# Patient Record
Sex: Female | Born: 1945 | Race: Black or African American | Hispanic: No | Marital: Married | Smoking: Never smoker
Health system: Southern US, Community
[De-identification: ages and names within clinical notes are randomized; demographics above are authoritative.]

## PROBLEM LIST (undated history)

## (undated) DIAGNOSIS — M8580 Other specified disorders of bone density and structure, unspecified site: Secondary | ICD-10-CM

## (undated) DIAGNOSIS — D692 Other nonthrombocytopenic purpura: Secondary | ICD-10-CM

## (undated) DIAGNOSIS — K649 Unspecified hemorrhoids: Secondary | ICD-10-CM

## (undated) DIAGNOSIS — I714 Abdominal aortic aneurysm, without rupture, unspecified: Secondary | ICD-10-CM

## (undated) DIAGNOSIS — H409 Unspecified glaucoma: Secondary | ICD-10-CM

## (undated) DIAGNOSIS — D649 Anemia, unspecified: Secondary | ICD-10-CM

## (undated) DIAGNOSIS — E559 Vitamin D deficiency, unspecified: Secondary | ICD-10-CM

## (undated) DIAGNOSIS — E7800 Pure hypercholesterolemia, unspecified: Secondary | ICD-10-CM

## (undated) DIAGNOSIS — K219 Gastro-esophageal reflux disease without esophagitis: Secondary | ICD-10-CM

## (undated) DIAGNOSIS — K635 Polyp of colon: Secondary | ICD-10-CM

## (undated) DIAGNOSIS — M419 Scoliosis, unspecified: Secondary | ICD-10-CM

## (undated) DIAGNOSIS — K449 Diaphragmatic hernia without obstruction or gangrene: Secondary | ICD-10-CM

## (undated) DIAGNOSIS — I700 Atherosclerosis of aorta: Secondary | ICD-10-CM

## (undated) DIAGNOSIS — M1990 Unspecified osteoarthritis, unspecified site: Secondary | ICD-10-CM

## (undated) DIAGNOSIS — I10 Essential (primary) hypertension: Secondary | ICD-10-CM

## (undated) DIAGNOSIS — E785 Hyperlipidemia, unspecified: Secondary | ICD-10-CM

## (undated) HISTORY — DX: Abdominal aortic aneurysm, without rupture: I71.4

## (undated) HISTORY — DX: Other specified disorders of bone density and structure, unspecified site: M85.80

## (undated) HISTORY — PX: NO PAST SURGERIES: SHX2092

## (undated) HISTORY — DX: Unspecified hemorrhoids: K64.9

## (undated) HISTORY — DX: Diaphragmatic hernia without obstruction or gangrene: K44.9

## (undated) HISTORY — DX: Abdominal aortic aneurysm, without rupture, unspecified: I71.40

## (undated) HISTORY — DX: Essential (primary) hypertension: I10

## (undated) HISTORY — DX: Atherosclerosis of aorta: I70.0

## (undated) HISTORY — DX: Pure hypercholesterolemia, unspecified: E78.00

## (undated) HISTORY — DX: Polyp of colon: K63.5

## (undated) HISTORY — DX: Vitamin D deficiency, unspecified: E55.9

## (undated) HISTORY — DX: Unspecified osteoarthritis, unspecified site: M19.90

## (undated) HISTORY — DX: Other nonthrombocytopenic purpura: D69.2

## (undated) HISTORY — DX: Unspecified glaucoma: H40.9

## (undated) HISTORY — DX: Hyperlipidemia, unspecified: E78.5

---

## 2015-10-07 ENCOUNTER — Other Ambulatory Visit: Payer: Self-pay | Admitting: Internal Medicine

## 2015-10-07 DIAGNOSIS — Z1231 Encounter for screening mammogram for malignant neoplasm of breast: Secondary | ICD-10-CM

## 2015-10-08 ENCOUNTER — Encounter: Payer: Self-pay | Admitting: Internal Medicine

## 2016-03-29 ENCOUNTER — Ambulatory Visit: Payer: Medicare Other | Attending: Registered Nurse | Admitting: Physical Therapy

## 2016-03-29 DIAGNOSIS — M5441 Lumbago with sciatica, right side: Secondary | ICD-10-CM | POA: Diagnosis not present

## 2016-03-29 DIAGNOSIS — M6281 Muscle weakness (generalized): Secondary | ICD-10-CM | POA: Insufficient documentation

## 2016-03-29 DIAGNOSIS — R293 Abnormal posture: Secondary | ICD-10-CM | POA: Insufficient documentation

## 2016-03-29 NOTE — Therapy (Signed)
[place], [place], [address] Phone: [phone]   Fax:  [phone]  Physical Therapy Evaluation  Patient Details  Name: Meghan Stanley MRN: [id number] Date of Birth: 08/06/1946 Referring Provider: Domenick Gong MD  Encounter Date: 03/29/2016      PT End of Session - 03/29/16 1741    Visit Number 1   Number of Visits 13   Date for PT Re-Evaluation 05/10/16   Authorization Type Medicare: Kx mod by 15th visit, progress note by 10th visit   PT Start Time 1500   PT Stop Time 1548   PT Time Calculation (min) 48 min   Activity Tolerance Patient tolerated treatment well   Behavior During Therapy [facility] for tasks assessed/performed      No past medical history on file.  No past surgical history on file.  There were no vitals filed for this visit.       Subjective Assessment - 03/29/16 1515    Subjective pt is 70 y.o with low back pain with referral down the RLE that started quickly that started a couple years ago. The pain has fluctuated in the back but gradually worsened within the last 6 months. weakness in the RLE with waling/ standing and currenlty used a RW that she just started using it. Referred pain down the RLE to the knee.    Pertinent History hx of scoliosis   Limitations Standing;Walking;House hold activities;Sitting   How long can you sit comfortably? unlimited   How long can you stand comfortably? 20-30 min    How long can you walk comfortably? With RW 15-20 min   Diagnostic tests Bone density at primary MD   Patient Stated Goals to decrease pain, to see if exercises can relieve some of the pain.    Currently in Pain? Yes   Pain Score 7    Pain Location Back   Pain Orientation Lower;Right   Pain Descriptors / Indicators Constant;Sore;Aching   Pain Type Chronic pain   Pain Radiating Towards RLE to the knee   Pain Onset More than a month ago   Pain Frequency Constant   Aggravating  Factors  prolonged walking/ standing   Pain Relieving Factors laying down   Effect of Pain on Daily Activities  limited endurance/ mobility            [facility] PT Assessment - 03/29/16 1524      Assessment   Medical Diagnosis Low back pain with RLE radiculopathy   Referring Provider Domenick Gong MD   Onset Date/Surgical Date --  chronically 2-3 years, recent exacerbation 6 months   Hand Dominance Right   Next MD Visit Make one PRN   Prior Therapy no     Precautions   Precautions None     Restrictions   Weight Bearing Restrictions No     Balance Screen   Has the patient fallen in the past 6 months No   Has the patient had a decrease in activity level because of a fear of falling?  No   Is the patient reluctant to leave their home because of a fear of falling?  No     Home Social worker Private residence   Living Arrangements Spouse/significant other;Children   Available Help at Discharge Available PRN/intermittently   Type of [place] to enter   Entrance Stairs-Number of Steps 1   Entrance Stairs-Rails Right   Home Layout One level  [place] - 2 wheels;Cane - single point     Prior Function   Level of Independence Independent;Independent with basic ADLs   Vocation Retired   Leisure yard work, having company over,      Charity fundraiser Status Within [employer] for tasks assessed     Observation/Other Assessments   Focus on Therapeutic Outcomes (FOTO)  55% limited  predicted 43% limited     Posture/Postural Control   Posture/Postural Control Postural limitations   Postural Limitations Rounded Shoulders;Forward head  lumbar covexity to the R     ROM / Strength   AROM / PROM / Strength AROM;Strength     AROM   AROM Assessment Site Lumbar   Lumbar Flexion 90   Lumbar Extension 10   Lumbar - Right Side Bend 8   Lumbar - Left Side Bend 10     Strength   Strength Assessment Site  Hip;Knee   Right/Left Hip Right;Left   Right Hip Flexion 3+/5   Right Hip Extension 3+/5   Right Hip ABduction 3/5   Right Hip ADduction 4/5   Left Hip Flexion 3+/5   Left Hip Extension 3+/5   Left Hip ABduction 3/5   Left Hip ADduction 4/5   Right/Left Knee Right;Left   Right Knee Flexion 4+/5   Right Knee Extension 4+/5   Left Knee Flexion 4+/5   Left Knee Extension 4+/5     Palpation   Spinal mobility  hypomobiltiy of L1- T11 with PAIVM   Palpation comment tightness along the bil lumbar paraspinals with referral to the upper glute upon palpation     Special Tests    Special Tests Lumbar   Lumbar Tests Slump Test;Straight Leg Raise;other     Slump test   Findings Negative   Side --  bil     Straight Leg Raise   Findings Negative     Ambulation/Gait   Ambulation/Gait Yes   Assistive device Rolling walker  to help push her into proper posture   Gait Pattern Within Functional Limits                           PT Education - 03/29/16 1740    Education provided Yes   Education Details evaluation findings, POC, goals, HEP with form/ treatment rationale   Person(s) Educated Patient   Methods Demonstration;Explanation;Verbal cues   Comprehension Verbalized understanding;Verbal cues required;Returned demonstration          PT Short Term Goals - 03/29/16 1749      PT SHORT TERM GOAL #1   Title pt will be I with inital HEP given ( 04/19/2016)   Time 3   Period Weeks   Status New     PT SHORT TERM GOAL #2   Title pt will be able to verbalize/ demo proper posture and lifting/ carrying mechanics to prevent / reduce low back pain (04/19/2016)   Time 3   Period Weeks   Status New           PT Long Term Goals - 03/29/16 1750      PT LONG TERM GOAL #1   Title pt will be I with all HEP given as throughout treatment (05/10/2016)   Time 6   Period Weeks   Status New     PT LONG TERM GOAL #2   Title She will improve trunk extension / R side  bending by >/= 8 degrees with </=  3/10 pain to assist with proper standing posture, and ADLs (05/10/2016)   Time 6   Period Weeks   Status New     PT LONG TERM GOAL #3   Title She will improve bil overall hip strength to >/= 4/5 with </= 3/10 pain for endurnace and promote safety with prolong walking/ standing (05/10/2016)   Time 6   Period Weeks   Status New     PT LONG TERM GOAL #4   Title pt will be able to walk/ standing for >/= 30 min demonstrate good spinal posture without AD with </=3/10 pain to assist with functional endurance required for ADLs (05/10/2016)   Time 6   Period Weeks   Status New     PT LONG TERM GOAL #5   Title FOTO score increase to </= 43% limited to demonstrate improvement in function at discharge (05/10/2016)   Time 6   Period Weeks   Status New               Plan - 04/03/16 1742    Clinical Impression Statement Mrs. Wrice presents to [facility] as a low complexity evaluation with CC of chronic low back pain with RLE referral. 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 She demonstates limited extension, and R side bending.  hypomobiltiy of L1- T11 with PAIVM, with tightness in bil Lumbar paraspinals. weakness of bil LE.pt would benefit from physical therapy to decrease low back pain, improve LE strength, increase functional mobility/ endurance and maximizing her function by addressing the defecits listed.    Rehab Potential Good   PT Frequency 2x / week   PT Duration 6 weeks   PT Treatment/Interventions ADLs/Self Care Home Management;Cryotherapy;Electrical Stimulation;Iontophoresis 4mg /ml Dexamethasone;Passive range of motion;Dry needling;Taping;Manual techniques;Moist Heat;Patient/family education;Therapeutic exercise;Therapeutic activities;Ultrasound   PT Next Visit Plan assess/ review HEP, core / hip strengthening, Prone on elbows, modalities PRN   PT Home Exercise Plan prone on elbows, lower trunk rotation, pelvic tilts, bridge, clams    Consulted and Agree with Plan of Care Patient      Patient will benefit from skilled therapeutic intervention in order to improve the following deficits and impairments:  Pain, Improper body mechanics, Postural dysfunction, Hypomobility, Impaired flexibility, Decreased strength, Decreased mobility, Decreased activity tolerance, Decreased endurance, Decreased balance, Increased fascial restricitons, Decreased range of motion  Visit Diagnosis: Right-sided low back pain with right-sided sciatica  Abnormal posture  Muscle weakness (generalized)      G-Codes - Apr 03, 2016 1756    Functional Assessment Tool Used FOTO/ clinical judgement    Functional Limitation Changing and maintaining body position   Changing and Maintaining Body Position Current Status [id number]) At least 40 percent but less than 60 percent impaired, limited or restricted   Changing and Maintaining Body Position Goal Status [id number]) At least 20 percent but less than 40 percent impaired, limited or restricted       Problem List There are no active problems to display for this patient.  Starr Lake PT, DPT, LAT, ATC  April 03, 2016  5:58 PM      [facility] [address] [place], [place], [address] Phone: [phone]   Fax:  [phone]  Name: JEANINNE BARBANO MRN: [id number] Date of Birth: [birth year]

## 2016-04-07 ENCOUNTER — Encounter: Payer: Self-pay | Admitting: Physical Therapy

## 2016-04-07 ENCOUNTER — Ambulatory Visit: Payer: Medicare Other | Attending: Registered Nurse | Admitting: Physical Therapy

## 2016-04-07 DIAGNOSIS — R293 Abnormal posture: Secondary | ICD-10-CM | POA: Diagnosis present

## 2016-04-07 DIAGNOSIS — M5441 Lumbago with sciatica, right side: Secondary | ICD-10-CM | POA: Diagnosis not present

## 2016-04-07 DIAGNOSIS — M6281 Muscle weakness (generalized): Secondary | ICD-10-CM | POA: Insufficient documentation

## 2016-04-07 NOTE — Therapy (Signed)
[place], [place], [address] Phone: [phone]   Fax:  [phone]  Physical Therapy Treatment  Patient Details  Name: Meghan Stanley MRN: [id number] Date of Birth: 14-Mar-1946 Referring Provider: Domenick Gong MD  Encounter Date: 04/07/2016      PT End of Session - 04/07/16 1507    Visit Number 2   Number of Visits 13   Date for PT Re-Evaluation 05/10/16   Authorization Type Medicare: Kx mod by 15th visit, progress note by 10th visit   PT Start Time 1420   PT Stop Time 1500   PT Time Calculation (min) 40 min   Activity Tolerance Patient tolerated treatment well   Behavior During Therapy [facility] for tasks assessed/performed      No past medical history on file.  No past surgical history on file.  There were no vitals filed for this visit.      Subjective Assessment - 04/07/16 1426    Subjective "it looks like it is trying to help"   Currently in Pain? Yes   Pain Score 7   only with getting up and walking   Pain Location Back   Pain Orientation Right;Lower   Pain Descriptors / Indicators Aching;Sore   Pain Type Chronic pain   Pain Onset More than a month ago   Pain Frequency Constant   Aggravating Factors  getting up and walking   Pain Relieving Factors laying down,                          [facility] Adult PT Treatment/Exercise - 04/07/16 0001      Lumbar Exercises: Stretches   Prone on Elbows Stretch 5 reps;30 seconds   Prone on Elbows Stretch Limitations with upper body and lower body shifted to the R to work on Personal assistant Therapy   Manual Therapy Soft tissue mobilization;Other (comment)   Soft tissue mobilization DTM along R lumbar paraspinals and along R illac crest   Other Manual Therapy leg length mob with R leg pressing and Long axis distraction on L 5 x 10 sec hold                PT Education - 04/07/16 1510    Education provided Yes   Education Details Reviewed HEP, provided heel lift and educated she it may take a little while to get acclimated to it. to sit up straight to promote promote posture that will help with breathing and posture.    Person(s) Educated Patient   Methods Explanation;Verbal cues   Comprehension Verbalized understanding          PT Short Term Goals - 03/29/16 1749      PT SHORT TERM GOAL #1   Title pt will be I with inital HEP given ( 04/19/2016)   Time 3   Period Weeks   Status New     PT SHORT TERM GOAL #2   Title pt will be able to verbalize/ demo proper posture and lifting/ carrying mechanics to prevent / reduce low back pain (04/19/2016)   Time 3   Period Weeks   Status New           PT Long Term Goals - 03/29/16 1750      PT LONG TERM GOAL #1   Title pt will be I with all HEP given as throughout treatment (05/10/2016)   Time 6   Period Weeks  Status New     PT LONG TERM GOAL #2   Title [name], and ADLs (05/10/2016)   Time 6   Period Weeks   Status New     PT LONG TERM GOAL #3   Title She will improve bil overall hip strength to >/= 4/5 with </= 3/10 pain for endurnace and promote safety with prolong walking/ standing (05/10/2016)   Time 6   Period Weeks   Status New     PT LONG TERM GOAL #4   Title pt will be able to walk/ standing for >/= 30 min demonstrate good spinal posture without AD with </=3/10 pain to assist with functional endurance required for ADLs (05/10/2016)   Time 6   Period Weeks   Status New     PT LONG TERM GOAL #5   Title FOTO score increase to </= 43% limited to demonstrate improvement in function at discharge (05/10/2016)   Time 6   Period Weeks   Status New               Plan - 04/07/16 1508    Clinical Impression Statement Mrs. Copps states she thinks her HEP is helping but its tough to say. Focused todays session on  manual to calm down tightness and gentle mobs to promote alignment. provided pt with heel lift in L shoe to help with alignment. pt reported mild relief of pain post session.    PT Next Visit Plan  core / hip strengthening, Prone on elbows, modalities PRN, alignment   PT Home Exercise Plan HEP review   Consulted and Agree with Plan of Care Patient      Patient will benefit from skilled therapeutic intervention in order to improve the following deficits and impairments:  Pain, Improper body mechanics, Postural dysfunction, Hypomobility, Impaired flexibility, Decreased strength, Decreased mobility, Decreased activity tolerance, Decreased endurance, Decreased balance, Increased fascial restricitons, Decreased range of motion  Visit Diagnosis: Right-sided low back pain with right-sided sciatica  Abnormal posture  Muscle weakness (generalized)     Problem List There are no active problems to display for this patient.  Starr Lake PT, DPT, LAT, ATC  04/07/16  3:21 PM      [facility] [address] [place], [place], [address] Phone: [phone]   Fax:  [phone]  Name: Meghan Stanley MRN: [id number] Date of Birth: 1945/09/25

## 2016-04-09 ENCOUNTER — Encounter: Payer: Self-pay | Admitting: Physical Therapy

## 2016-04-14 ENCOUNTER — Encounter: Payer: Self-pay | Admitting: Physical Therapy

## 2016-04-16 ENCOUNTER — Encounter: Payer: Self-pay | Admitting: Physical Therapy

## 2016-04-27 ENCOUNTER — Encounter: Payer: Self-pay | Admitting: Physical Therapy

## 2016-04-27 ENCOUNTER — Ambulatory Visit: Payer: Medicare Other | Admitting: Physical Therapy

## 2016-04-27 DIAGNOSIS — M5441 Lumbago with sciatica, right side: Secondary | ICD-10-CM

## 2016-04-27 DIAGNOSIS — R293 Abnormal posture: Secondary | ICD-10-CM

## 2016-04-27 DIAGNOSIS — M6281 Muscle weakness (generalized): Secondary | ICD-10-CM

## 2016-04-27 NOTE — Therapy (Signed)
[place], [place], [address] Phone: [phone]   Fax:  [phone]  Physical Therapy Treatment  Patient Details  Name: Meghan Stanley MRN: [id number] Date of Birth: 1945-12-24 Referring Provider: Domenick Gong MD  Encounter Date: 04/27/2016      PT End of Session - 04/27/16 1456    Visit Number 3   Number of Visits 13   Date for PT Re-Evaluation 05/10/16   Authorization Type Medicare: Kx mod by 15th visit, progress note by 10th visit   PT Start Time 1416   PT Stop Time 1456   PT Time Calculation (min) 40 min   Activity Tolerance Patient tolerated treatment well   Behavior During Therapy [facility] for tasks assessed/performed      No past medical history on file.  No past surgical history on file.  There were no vitals filed for this visit.      Subjective Assessment - 04/27/16 1415    Subjective "The exercises may be helping but still have pain in the side/ lower back" Not feeling as much in the legs   Currently in Pain? Yes   Pain Score 7    Pain Location Back   Pain Orientation Right;Lower   Pain Type Chronic pain   Pain Onset More than a month ago   Pain Frequency Constant   Aggravating Factors  getting up/ walking   Pain Relieving Factors laying down            [facility] PT Assessment - 04/27/16 1451      Strength   Right Hip Flexion 4-/5   Right Hip Extension 3+/5   Right Hip ABduction 3+/5   Left Hip Flexion 4-/5   Left Hip Extension 3+/5   Left Hip ABduction 3+/5                     [facility] Adult PT Treatment/Exercise - 04/27/16 1440      Lumbar Exercises: Supine   Clam 10 reps  2 sets with red theraband   Clam Limitations manual postural correction to put body in neutral position   Bent Knee Raise 20 reps  x 2 with cues to keep core tight   Other Supine Lumbar Exercises with physioball, feet on ball and both knees rotated to the L  2 x 15 leg press with manual  reistance under LLE only, hip flexion/ extension in same postion on phyisobal 2 x 20 with no resistance     Manual Therapy   Manual Therapy Joint mobilization   Joint Mobilization Grade 2 mob T7-T12 in sitting with active trunk extension 2 x 10    Other Manual Therapy leg length mob with R leg pressing and Long axis distraction on L 5 x 10 sec hold                PT Education - 04/27/16 1456    Education provided Yes   Education Details reviewed and updated HEP   Person(s) Educated Patient   Methods Explanation;Handout;Verbal cues   Comprehension Verbalized understanding          PT Short Term Goals - 04/27/16 1459      PT SHORT TERM GOAL #1   Title pt will be I with inital HEP given ( 04/19/2016)   Baseline not consistent   Time 3   Period Weeks   Status Partially Met     PT SHORT TERM GOAL #2   Title pt  will be able to verbalize/ demo proper posture and lifting/ carrying mechanics to prevent / reduce low back pain (04/19/2016)   Time 3   Period Weeks   Status On-going           PT Long Term Goals - 04/27/16 1500      PT LONG TERM GOAL #1   Title pt will be I with all HEP given as throughout treatment (05/10/2016)   Time 6   Period Weeks   Status On-going     PT LONG TERM GOAL #2   Title [name], and ADLs (05/10/2016)   Time 6   Period Weeks   Status On-going     PT LONG TERM GOAL #3   Title She will improve bil overall hip strength to >/= 4/5 with </= 3/10 pain for endurnace and promote safety with prolong walking/ standing (05/10/2016)   Time 6   Period Weeks   Status On-going     PT LONG TERM GOAL #4   Title pt will be able to walk/ standing for >/= 30 min demonstrate good spinal posture without AD with </=3/10 pain to assist with functional endurance required for ADLs (05/10/2016)   Time 6   Period Weeks   Status On-going     PT LONG  TERM GOAL #5   Title FOTO score increase to </= 43% limited to demonstrate improvement in function at discharge (05/10/2016)   Time 6   Period Weeks   Status On-going               Plan - 04/27/16 1456    Clinical Impression Statement Mrs. Stcharles reported 7/10 pain initally today. focused on correcting mechaincs in supine with physioball, strengthening of the hips which she performed well. She continues to require verbal cues to avoid a forward hunched position. post session she reported pain dropped to 6/10 today.    PT Next Visit Plan  core / hip strengthening, Prone on elbows, modalities PRN, alignment, use UE in standing with physioball/ rotation, continue supine with physioball   PT Home Exercise Plan thoracic extension   Consulted and Agree with Plan of Care Patient      Patient will benefit from skilled therapeutic intervention in order to improve the following deficits and impairments:  Pain, Improper body mechanics, Postural dysfunction, Hypomobility, Impaired flexibility, Decreased strength, Decreased mobility, Decreased activity tolerance, Decreased endurance, Decreased balance, Increased fascial restricitons, Decreased range of motion  Visit Diagnosis: Right-sided low back pain with right-sided sciatica  Abnormal posture  Muscle weakness (generalized)     Problem List There are no active problems to display for this patient.  Starr Lake PT, DPT, LAT, ATC  04/27/16  3:01 PM      [facility] [address] [place], [place], [address] Phone: [phone]   Fax:  [phone]  Name: Meghan Stanley MRN: [id number] Date of Birth: [birth year]

## 2016-04-29 ENCOUNTER — Encounter: Payer: Self-pay | Admitting: Physical Therapy

## 2016-05-03 ENCOUNTER — Ambulatory Visit: Payer: Medicare Other | Admitting: Physical Therapy

## 2016-05-03 ENCOUNTER — Encounter: Payer: Self-pay | Admitting: Physical Therapy

## 2016-05-03 DIAGNOSIS — M5441 Lumbago with sciatica, right side: Secondary | ICD-10-CM

## 2016-05-03 DIAGNOSIS — R293 Abnormal posture: Secondary | ICD-10-CM

## 2016-05-03 DIAGNOSIS — M6281 Muscle weakness (generalized): Secondary | ICD-10-CM

## 2016-05-03 NOTE — Therapy (Signed)
[place], [place], [address] Phone: [phone]   Fax:  [phone]  Physical Therapy Treatment  Patient Details  Name: Meghan Stanley MRN: [id number] Date of Birth: April 12, 1946 Referring Provider: Domenick Gong MD  Encounter Date: 05/03/2016      PT End of Session - 05/03/16 1545    Visit Number 4   Number of Visits 13   Date for PT Re-Evaluation 05/10/16   Authorization Type Medicare: Kx mod by 15th visit, progress note by 10th visit   PT Start Time 1502   PT Stop Time 0345   PT Time Calculation (min) 763 min   Activity Tolerance Patient tolerated treatment well   Behavior During Therapy [facility] for tasks assessed/performed      No past medical history on file.  No past surgical history on file.  There were no vitals filed for this visit.      Subjective Assessment - 05/03/16 1511    Subjective "I am feeling alittle better today, I feel like I just need to keep it up"    Currently in Pain? Yes   Pain Score 5    Pain Location Back   Pain Orientation Right   Pain Descriptors / Indicators Aching   Pain Onset More than a month ago   Pain Frequency Constant                         [facility] Adult PT Treatment/Exercise - 05/03/16 0001      Lumbar Exercises: Standing   Other Standing Lumbar Exercises pressing down on physioball under L hand to activate L hand with phyisoball on table 2 x 10   verbal cues for breathingout while pressing     Lumbar Exercises: Supine   Clam 10 reps   Bent Knee Raise 20 reps   Other Supine Lumbar Exercises with physioball, feet on ball and both knees rotated to the L  2 x 15 leg press with manual reistance under LLE only, hip flexion/ extension in same postion on phyisobal 2 x 20 with no resistance   Other Supine Lumbar Exercises L hip hikes 2 x 10      Manual Therapy   Other Manual Therapy leg length mob with R leg pressing and Long axis distraction  on L 5 x 10 sec hold                  PT Short Term Goals - 04/27/16 1459      PT SHORT TERM GOAL #1   Title pt will be I with inital HEP given ( 04/19/2016)   Baseline not consistent   Time 3   Period Weeks   Status Partially Met     PT SHORT TERM GOAL #2   Title pt will be able to verbalize/ demo proper posture and lifting/ carrying mechanics to prevent / reduce low back pain (04/19/2016)   Time 3   Period Weeks   Status On-going           PT Long Term Goals - 04/27/16 1500      PT LONG TERM GOAL #1   Title pt will be I with all HEP given as throughout treatment (05/10/2016)   Time 6   Period Weeks   Status On-going     PT LONG TERM GOAL #2   Title She will improve trunk extension / R side bending by >/= 8 degrees with </= 3/10  pain to assist with proper standing posture, and ADLs (05/10/2016)   Time 6   Period Weeks   Status On-going     PT LONG TERM GOAL #3   Title She will improve bil overall hip strength to >/= 4/5 with </= 3/10 pain for endurnace and promote safety with prolong walking/ standing (05/10/2016)   Time 6   Period Weeks   Status On-going     PT LONG TERM GOAL #4   Title pt will be able to walk/ standing for >/= 30 min demonstrate good spinal posture without AD with </=3/10 pain to assist with functional endurance required for ADLs (05/10/2016)   Time 6   Period Weeks   Status On-going     PT LONG TERM GOAL #5   Title FOTO score increase to </= 43% limited to demonstrate improvement in function at discharge (05/10/2016)   Time 6   Period Weeks   Status On-going               Plan - 05/03/16 1546    Clinical Impression Statement Mrs. Fleeman demonstrates improvement with pain at 5/10 today. worked on improving alignment today and relieving tension which she reported decreased pain with standing/ walking. pt continues to fatigue quickly requiring multiple rest breaks. pt reports pain to be 4/10 post session .    PT Next Visit Plan  core /  hip strengthening, Prone on elbows, modalities PRN, alignment, use UE in standing with physioball/ rotation, continue supine with physioball   Consulted and Agree with Plan of Care Patient      Patient will benefit from skilled therapeutic intervention in order to improve the following deficits and impairments:  Pain, Improper body mechanics, Postural dysfunction, Hypomobility, Impaired flexibility, Decreased strength, Decreased mobility, Decreased activity tolerance, Decreased endurance, Decreased balance, Increased fascial restricitons, Decreased range of motion  Visit Diagnosis: Right-sided low back pain with right-sided sciatica  Abnormal posture  Muscle weakness (generalized)     Problem List There are no active problems to display for this patient.  Starr Lake PT, DPT, LAT, ATC  05/03/16  3:48 PM      [facility] [address] [place], [place], [address] Phone: [phone]   Fax:  [phone]  Name: Meghan Stanley MRN: [id number] Date of Birth: [birth year]

## 2016-05-05 ENCOUNTER — Encounter: Payer: Self-pay | Admitting: Physical Therapy

## 2016-05-11 ENCOUNTER — Ambulatory Visit: Payer: Medicare Other | Attending: Registered Nurse | Admitting: Physical Therapy

## 2016-05-11 DIAGNOSIS — M6281 Muscle weakness (generalized): Secondary | ICD-10-CM | POA: Diagnosis present

## 2016-05-11 DIAGNOSIS — R293 Abnormal posture: Secondary | ICD-10-CM

## 2016-05-11 DIAGNOSIS — M5441 Lumbago with sciatica, right side: Secondary | ICD-10-CM | POA: Diagnosis not present

## 2016-05-11 NOTE — Therapy (Signed)
[place], [place], [address] Phone: [phone]   Fax:  [phone]  Physical Therapy Treatment / Re-certification  Patient Details  Name: Meghan Stanley MRN: [id number] Date of Birth: May 13, 1946 Referring Provider: Domenick Gong MD  Encounter Date: 05/11/2016      PT End of Session - 05/11/16 1452    Visit Number 5   Number of Visits 13   Date for PT Re-Evaluation 07/06/16   Authorization Type Medicare: Kx mod by 15th visit, progress note by 10th visit   PT Start Time 1404   PT Stop Time 1454   PT Time Calculation (min) 50 min   Activity Tolerance Patient tolerated treatment well   Behavior During Therapy [facility] for tasks assessed/performed      No past medical history on file.  No past surgical history on file.  There were no vitals filed for this visit.      Subjective Assessment - 05/11/16 1406    Subjective "could be better with the exercises, but I feel like I am doing better with decreased pain"    Currently in Pain? Yes   Pain Score 4    Pain Location Back   Pain Orientation Right   Pain Descriptors / Indicators Aching   Pain Type Chronic pain   Pain Onset More than a month ago   Pain Frequency Intermittent   Aggravating Factors  standing/ walking   Pain Relieving Factors laying down/ sitting            [facility] PT Assessment - 05/11/16 1416      Observation/Other Assessments   Focus on Therapeutic Outcomes (FOTO)  40% limited     AROM   Lumbar Flexion 98   Lumbar Extension 16   Lumbar - Right Side Bend 10   Lumbar - Left Side Bend 15     Strength   Right Hip Flexion 4/5   Right Hip Extension 3+/5   Right Hip ABduction 4-/5   Left Hip Flexion 4-/5   Left Hip Extension 3+/5   Left Hip ABduction 4-/5   Right Knee Flexion 4+/5   Right Knee Extension 4+/5   Left Knee Flexion 4+/5   Left Knee Extension 4+/5                     [facility] Adult PT  Treatment/Exercise - 05/11/16 0001      Lumbar Exercises: Stretches   Prone on Elbows Stretch 5 reps;30 seconds  QL stretches on R in sitting 2 x 30 sec     Lumbar Exercises: Standing   Other Standing Lumbar Exercises pressing down on physioball under L hand to activate L hand with phyisoball on table 2 x 10      Lumbar Exercises: Supine   Other Supine Lumbar Exercises with physioball, feet on ball and both knees rotated to the R  2 x 15 leg press with manual reistance under LLE only, hip flexion/ extension in same postion on phyisobal 2 x 20 with no resistance   Other Supine Lumbar Exercises L hip hikes 2 x 10, pelvic tilts 2 x 10 with 5 sec hold while feet are on physioball and feet in/ knees in R sidebend position.                 PT Education - 05/11/16 1500    Education provided Yes   Education Details reviewed HEP, updated HEP for using of rolled up towel to  help with posture in sitting.    Person(s) Educated Patient   Methods Explanation;Verbal cues;Handout   Comprehension Verbalized understanding;Verbal cues required          PT Short Term Goals - 05/11/16 1422      PT SHORT TERM GOAL #1   Title pt will be I with inital HEP given ( 04/19/2016)   Time 3   Period Weeks   Status Achieved     PT SHORT TERM GOAL #2   Title pt will be able to verbalize/ demo proper posture and lifting/ carrying mechanics to prevent / reduce low back pain (04/19/2016)   Baseline knows what she needs to do, just has difficulty maintaining it,    Time 3   Period Weeks   Status Partially Met           PT Long Term Goals - 05/11/16 1423      PT LONG TERM GOAL #1   Title pt will be I with all HEP given as throughout treatment (05/10/2016)   Time 6   Period Weeks   Status On-going     PT LONG TERM GOAL #2   Title [name], and ADLs (05/10/2016)     PT LONG TERM GOAL  #3   Title She will improve bil overall hip strength to >/= 4/5 with </= 3/10 pain for endurnace and promote safety with prolong walking/ standing (05/10/2016)   Time 6   Period Weeks   Status On-going     PT LONG TERM GOAL #4   Title pt will be able to walk/ standing for >/= 30 min demonstrate good spinal posture without AD with </=3/10 pain to assist with functional endurance required for ADLs (05/10/2016)   Baseline continues to use RW to assist posture   Period Weeks   Status On-going     PT LONG TERM GOAL #5   Title FOTO score increase to </= 43% limited to demonstrate improvement in function at discharge (05/10/2016)   Baseline 40% limited   Time 6   Period Weeks   Status Achieved               Plan - 05/11/16 1501    Clinical Impression Statement [name] continues to progress with physical therapy with improved trunk mobility and improved trunk mobility. she is progressing well with goals. continued to work on posture and alignment with focus on contracting the L parapsinals. Plan to continue with current POC to work toward remaining goals and independent exercise.    Rehab Potential Good   PT Frequency Biweekly   PT Duration 6 weeks   PT Treatment/Interventions ADLs/Self Care Home Management;Cryotherapy;Electrical Stimulation;Iontophoresis '4mg'$ /ml Dexamethasone;Passive range of motion;Dry needling;Taping;Manual techniques;Moist Heat;Patient/family education;Therapeutic exercise;Therapeutic activities;Ultrasound   PT Next Visit Plan  core / hip strengthening, Prone on elbows, modalities PRN, alignment, use UE in standing with physioball/ rotation, continue supine with physioball, gait training with SPC to promote muscle contraction on the L trunk   PT Home Exercise Plan sitting posture   Consulted and Agree with Plan of Care Patient      Patient will benefit from skilled therapeutic intervention in order to improve the following deficits and impairments:  Pain, Improper body  mechanics, Postural dysfunction, Hypomobility, Impaired flexibility, Decreased strength, Decreased mobility, Decreased activity tolerance, Decreased endurance, Decreased balance, Increased fascial restricitons, Decreased range of motion  Visit  Diagnosis: Right-sided low back pain with right-sided sciatica - Plan: PT plan of care cert/re-cert  Abnormal posture - Plan: PT plan of care cert/re-cert  Muscle weakness (generalized) - Plan: PT plan of care cert/re-cert       G-Codes - 27-May-2016 1455    Functional Assessment Tool Used FOTO/ clinical judgement    Functional Limitation Changing and maintaining body position   Changing and Maintaining Body Position Current Status (N5583) At least 40 percent but less than 60 percent impaired, limited or restricted   Changing and Maintaining Body Position Goal Status (R6742) At least 20 percent but less than 40 percent impaired, limited or restricted      Problem List There are no active problems to display for this patient.  Starr Lake PT, DPT, LAT, ATC  2016/05/27  3:16 PM      [facility] [address] [place], [place], [address] Phone: [phone]   Fax:  [phone]  Name: Meghan Stanley MRN: [id number] Date of Birth: Jul 31, 1946

## 2016-05-11 NOTE — Patient Instructions (Signed)
Using a rolled up towel placed beneath the L hip in sitting to promote proper posture in siting. Use all the time.

## 2016-05-25 ENCOUNTER — Ambulatory Visit: Payer: Medicare Other | Admitting: Physical Therapy

## 2016-05-25 DIAGNOSIS — R293 Abnormal posture: Secondary | ICD-10-CM

## 2016-05-25 DIAGNOSIS — M5441 Lumbago with sciatica, right side: Secondary | ICD-10-CM | POA: Diagnosis not present

## 2016-05-25 DIAGNOSIS — M6281 Muscle weakness (generalized): Secondary | ICD-10-CM

## 2016-05-25 NOTE — Therapy (Signed)
[place], [place], [address] Phone: [phone]   Fax:  [phone]  Physical Therapy Treatment  Patient Details  Name: Meghan Stanley MRN: [id number] Date of Birth: [birth year] Referring Provider: Domenick Gong MD  Encounter Date: 05/25/2016      PT End of Session - 05/25/16 1602    Visit Number 6   Number of Visits 13   Date for PT Re-Evaluation 07/06/16   Authorization Type Medicare: Kx mod by 15th visit, progress note by 10th visit   PT Start Time 1501   PT Stop Time 1544   PT Time Calculation (min) 43 min   Activity Tolerance Patient tolerated treatment well   Behavior During Therapy [facility] for tasks assessed/performed      No past medical history on file.  No past surgical history on file.  There were no vitals filed for this visit.      Subjective Assessment - 05/25/16 1512    Subjective "I think I am getting better, some days I am sore, other days I am doing okay, depends on movement it will fluctuate"    Currently in Pain? Yes   Pain Score 5    Pain Location Back   Pain Orientation Right   Pain Descriptors / Indicators Aching   Pain Type Chronic pain   Pain Onset More than a month ago   Pain Frequency Intermittent   Aggravating Factors  standing/ walking depends on movement   Pain Relieving Factors laying down/ sitting                         [facility] Adult PT Treatment/Exercise - 05/25/16 0001      Lumbar Exercises: Stretches   Lower Trunk Rotation 5 reps  resisted with red theraband pulling to the R     Lumbar Exercises: Standing   Other Standing Lumbar Exercises pressing down on physioball under L hand to activate L hand with phyisoball on table 2 x 10    Other Standing Lumbar Exercises gait training using SPC 1 x 90 ft    1 x CGA to for balance, cues to keep cane on left     Lumbar Exercises: Supine   Other Supine Lumbar Exercises with physioball, feet on  ball and both knees rotated to the R  2 x 15 leg press with manual reistance under LLE only, hip flexion/ extension in same postion on phyisobal 2 x 20 with no resistance   Other Supine Lumbar Exercises L hip hike with L shoulder press in to ball 2 x 10   verbal cues to keep back flat     Manual Therapy   Manual therapy comments manual trigger point release in the R QL x 2   Other Manual Therapy leg length mob with R leg pressing and Long axis distraction on L 5 x 10 sec hold                PT Education - 05/25/16 1602    Education provided Yes   Education Details reviewed HEP and upated HEP with proper form and treatment rationale, proper fit for cane and use with walking on L.   Person(s) Educated Patient   Methods Explanation;Verbal cues;Handout   Comprehension Verbalized understanding;Verbal cues required          PT Short Term Goals - 05/11/16 1422      PT SHORT TERM GOAL #1   Title pt will  be I with inital HEP given ( 04/19/2016)   Time 3   Period Weeks   Status Achieved     PT SHORT TERM GOAL #2   Title pt will be able to verbalize/ demo proper posture and lifting/ carrying mechanics to prevent / reduce low back pain (04/19/2016)   Baseline knows what she needs to do, just has difficulty maintaining it,    Time 3   Period Weeks   Status Partially Met           PT Long Term Goals - 05/11/16 1423      PT LONG TERM GOAL #1   Title pt will be I with all HEP given as throughout treatment (05/10/2016)   Time 6   Period Weeks   Status On-going     PT LONG TERM GOAL #2   Title [name], and ADLs (05/10/2016)     PT LONG TERM GOAL #3   Title She will improve bil overall hip strength to >/= 4/5 with </= 3/10 pain for endurnace and promote safety with prolong walking/ standing (05/10/2016)   Time 6   Period Weeks   Status On-going     PT LONG TERM  GOAL #4   Title pt will be able to walk/ standing for >/= 30 min demonstrate good spinal posture without AD with </=3/10 pain to assist with functional endurance required for ADLs (05/10/2016)   Baseline continues to use RW to assist posture   Period Weeks   Status On-going     PT LONG TERM GOAL #5   Title FOTO score increase to </= 43% limited to demonstrate improvement in function at discharge (05/10/2016)   Baseline 40% limited   Time 6   Period Weeks   Status Achieved               Plan - 05/25/16 1603    Clinical Impression Statement Meghan Stanley continues to report fluctuating pain and tightness depending on her movement. Focused on L trunk muscle activation which she performed well with. she brought a SPC in today which required fiting for proper height and training for use. pt reported some soreness inthe R trunk post session at 5/10.    Rehab Potential Good   PT Next Visit Plan  core / hip strengthening, Prone on elbows, modalities PRN, alignment, use UE in standing with physioball/ rotation, continue supine with physioball, gait training with SPC to promote muscle contraction on the L trunk   Consulted and Agree with Plan of Care Patient      Patient will benefit from skilled therapeutic intervention in order to improve the following deficits and impairments:  Pain, Improper body mechanics, Postural dysfunction, Hypomobility, Impaired flexibility, Decreased strength, Decreased mobility, Decreased activity tolerance, Decreased endurance, Decreased balance, Increased fascial restricitons, Decreased range of motion  Visit Diagnosis: Right-sided low back pain with right-sided sciatica  Abnormal posture  Muscle weakness (generalized)     Problem List There are no active problems to display for this patient.  Meghan Stanley PT, DPT, LAT, ATC  05/25/16  4:09 PM      [facility] [address] [place], [place],  [address] Phone: [phone]   Fax:  [phone]  Name: Meghan Stanley MRN: [id number] Date of Birth: 05/29/1946

## 2016-06-08 ENCOUNTER — Ambulatory Visit: Payer: Medicare Other | Attending: Registered Nurse | Admitting: Physical Therapy

## 2016-06-08 DIAGNOSIS — M5441 Lumbago with sciatica, right side: Secondary | ICD-10-CM | POA: Diagnosis present

## 2016-06-08 DIAGNOSIS — R293 Abnormal posture: Secondary | ICD-10-CM | POA: Diagnosis present

## 2016-06-08 DIAGNOSIS — M6281 Muscle weakness (generalized): Secondary | ICD-10-CM | POA: Diagnosis present

## 2016-06-08 NOTE — Therapy (Signed)
[place], [place], [address] Phone: [phone]   Fax:  [phone]  Physical Therapy Treatment  Patient Details  Name: Meghan Stanley MRN: [id number] Date of Birth: 1945/10/27 Referring Provider: Domenick Gong MD  Encounter Date: 06/08/2016      PT End of Session - 06/08/16 1733    Visit Number 7   Number of Visits 13   Date for PT Re-Evaluation 07/06/16   Authorization Type Medicare: Kx mod by 15th visit, progress note by 10th visit   PT Start Time 1547   PT Stop Time 1630   PT Time Calculation (min) 43 min   Activity Tolerance Patient tolerated treatment well   Behavior During Therapy [facility] for tasks assessed/performed      No past medical history on file.  No past surgical history on file.  There were no vitals filed for this visit.      Subjective Assessment - 06/08/16 1552    Subjective "I thing Im still getting better, still have soreness on the R side and low back, have been using the"   Currently in Pain? Yes   Pain Score 3    Pain Location Back   Pain Orientation Right   Pain Descriptors / Indicators Aching;Sore   Aggravating Factors  getting up to move,    Pain Relieving Factors laying down/ sitting                         [facility] Adult PT Treatment/Exercise - 06/08/16 0001      Lumbar Exercises: Stretches   Lower Trunk Rotation 3 reps;30 seconds  focused to the L to stretch the R side     Lumbar Exercises: Supine   Clam 10 reps   Bent Knee Raise 20 reps   Other Supine Lumbar Exercises with physioball, feet on ball and both knees rotated to the R  2 x 15 leg press with manual reistance under LLE only, hip flexion/ extension in same postion on phyisobal 2 x 20 with no resistance   Other Supine Lumbar Exercises R sidelying with LLE crossed over RLE into flexed pos. peroforming L arm rows, adduction/ horizontal adduction, ceiling punches 2 x 15 each   using red  theraband for osme                PT Education - 06/08/16 1730    Education provided Yes   Education Details updated HEP with specific cues for proper form and technique    Person(s) Educated Patient   Methods Explanation;Verbal cues   Comprehension Verbalized understanding;Verbal cues required          PT Short Term Goals - 05/11/16 1422      PT SHORT TERM GOAL #1   Title pt will be I with inital HEP given ( 04/19/2016)   Time 3   Period Weeks   Status Achieved     PT SHORT TERM GOAL #2   Title pt will be able to verbalize/ demo proper posture and lifting/ carrying mechanics to prevent / reduce low back pain (04/19/2016)   Baseline knows what she needs to do, just has difficulty maintaining it,    Time 3   Period Weeks   Status Partially Met           PT Long Term Goals - 05/11/16 1423      PT LONG TERM GOAL #1   Title pt will be I with  all HEP given as throughout treatment (05/10/2016)   Time 6   Period Weeks   Status On-going     PT LONG TERM GOAL #2   Title [name], and ADLs (05/10/2016)     PT LONG TERM GOAL #3   Title She will improve bil overall hip strength to >/= 4/5 with </= 3/10 pain for endurnace and promote safety with prolong walking/ standing (05/10/2016)   Time 6   Period Weeks   Status On-going     PT LONG TERM GOAL #4   Title pt will be able to walk/ standing for >/= 30 min demonstrate good spinal posture without AD with </=3/10 pain to assist with functional endurance required for ADLs (05/10/2016)   Baseline continues to use RW to assist posture   Period Weeks   Status On-going     PT LONG TERM GOAL #5   Title FOTO score increase to </= 43% limited to demonstrate improvement in function at discharge (05/10/2016)   Baseline 40% limited   Time 6   Period Weeks   Status Achieved               Plan - 06/08/16 1734     Clinical Impression Statement pt reports decreased pain at a 7/10. continued focus on L trunk/ core activation exercise to assist with correction and stretch the R side while utilizing L UE. she reported no pain in standing post session and updated HEP.    PT Next Visit Plan  core / hip strengthening, Prone on elbows, use UE in standing with physioball/ gait training with SPC to promote muscle contraction on the L trunk, standing correction   PT Home Exercise Plan R sidelying with L LLE crossed over doing thoracic mobility and LUE strengthening, LTR holding knees to the right to stretch   Consulted and Agree with Plan of Care Patient      Patient will benefit from skilled therapeutic intervention in order to improve the following deficits and impairments:  Pain, Improper body mechanics, Postural dysfunction, Hypomobility, Impaired flexibility, Decreased strength, Decreased mobility, Decreased activity tolerance, Decreased endurance, Decreased balance, Increased fascial restricitons, Decreased range of motion  Visit Diagnosis: Abnormal posture  Muscle weakness (generalized)  Right-sided low back pain with right-sided sciatica, unspecified chronicity     Problem List There are no active problems to display for this patient.  Starr Lake PT, DPT, LAT, ATC  06/08/16  5:41 PM      [facility] [address] [place], [place], [address] Phone: [phone]   Fax:  [phone]  Name: Meghan Stanley MRN: [id number] Date of Birth: [birth year]

## 2016-06-22 ENCOUNTER — Ambulatory Visit: Payer: Medicare Other | Admitting: Physical Therapy

## 2016-07-06 ENCOUNTER — Ambulatory Visit: Payer: Medicare Other | Admitting: Physical Therapy

## 2016-07-06 DIAGNOSIS — R293 Abnormal posture: Secondary | ICD-10-CM | POA: Diagnosis not present

## 2016-07-06 DIAGNOSIS — M6281 Muscle weakness (generalized): Secondary | ICD-10-CM

## 2016-07-06 DIAGNOSIS — M5441 Lumbago with sciatica, right side: Secondary | ICD-10-CM

## 2016-07-06 NOTE — Patient Instructions (Signed)
  3 x 30 sec hold over a good pillow or good sized rolled up towel.

## 2016-07-06 NOTE — Therapy (Signed)
Meghan Stanley, [place], [address] Phone: [phone]   Fax:  [phone]  Physical Therapy Treatment / Discharge Note  Patient Details  Name: Meghan Stanley MRN: [id number] Date of Birth: [birth year] Referring Provider: Domenick Gong MD  Encounter Date: 07/06/2016      PT End of Session - 07/06/16 1558    Visit Number 8   Number of Visits 13   Date for PT Re-Evaluation 07/06/16   Authorization Type Medicare: Kx mod by 15th visit, progress note by 10th visit   PT Start Time 1547   PT Stop Time 1630   PT Time Calculation (min) 43 min   Activity Tolerance Patient tolerated treatment well   Behavior During Therapy [facility] for tasks assessed/performed      No past medical history on file.  No past surgical history on file.  There were no vitals filed for this visit.      Subjective Assessment - 07/06/16 1556    Subjective "I have been doing the exercises somewhat consistently like a week at atime which seems to somewhat help my pain"    Currently in Pain? Yes   Pain Score 4    Pain Location Back   Pain Orientation Right   Pain Descriptors / Indicators Aching   Pain Type Chronic pain   Pain Onset More than a month ago   Pain Frequency Intermittent   Aggravating Factors  starting to walk   Pain Relieving Factors laying down/ sitting            [facility] PT Assessment - 07/06/16 0001      Observation/Other Assessments   Focus on Therapeutic Outcomes (FOTO)  53% limited     AROM   Lumbar Flexion 95   Lumbar Extension 18   Lumbar - Right Side Bend 11   Lumbar - Left Side Bend 18     Strength   Right Hip Flexion 4+/5   Right Hip Extension 4-/5   Right Hip ABduction 4-/5   Left Hip Flexion 4-/5   Left Hip Extension 4-/5   Left Hip ABduction 4-/5   Left Hip ADduction 4/5   Right Knee Flexion 4+/5   Right Knee Extension 4+/5   Left Knee Flexion 4+/5   Left Knee Extension 4+/5                      [facility] Adult PT Treatment/Exercise - 07/06/16 0001      Lumbar Exercises: Standing   Other Standing Lumbar Exercises Quadartus lumborum stretching 2 x 30 sec against wall, 3 x 30 sec over bolster to the L  reported no pain just fatigue following stretch   Other Standing Lumbar Exercises gait training using SPC 2 x 90 ft                 PT Education - 07/06/16 1655    Education provided Yes   Education Details reviewed and updated HEP for quadratus lumborum stretching. benefits for staying consistent with exercises especially since after doing exercises consistently for a week she reported decreased pain the back. discussed pt progress.    Person(s) Educated Patient   Methods Explanation;Verbal cues;Handout;Demonstration   Comprehension Verbalized understanding;Verbal cues required;Returned demonstration          PT Short Term Goals - 07/06/16 1614      PT SHORT TERM GOAL #1   Title pt will be I with inital HEP given ( 04/19/2016)  Time 3   Period Weeks   Status Achieved     PT SHORT TERM GOAL #2   Title pt will be able to verbalize/ demo proper posture and lifting/ carrying mechanics to prevent / reduce low back pain (04/19/2016)   Time 3   Period Weeks   Status Achieved           PT Long Term Goals - 01-Aug-2016 1614      PT LONG TERM GOAL #1   Title pt will be I with all HEP given as throughout treatment (05/10/2016)   Baseline not consistent with HEP given previously   Time 6   Period Weeks   Status Partially Met     PT LONG TERM GOAL #2   Title [name], and ADLs (05/10/2016)   Time 6   Period Weeks   Status Partially Met     PT LONG TERM GOAL #3   Title She will improve bil overall hip strength to >/= 4/5 with </= 3/10 pain for endurnace and promote safety with prolong walking/ standing (05/10/2016)   Time 6    Period Weeks   Status Partially Met     PT LONG TERM GOAL #4   Title pt will be able to walk/ standing for >/= 30 min demonstrate good spinal posture without AD with </=3/10 pain to assist with functional endurance required for ADLs (05/10/2016)   Baseline 15-20 min   Time 6   Period Weeks   Status Partially Met     PT LONG TERM GOAL #5   Title FOTO score increase to </= 43% limited to demonstrate improvement in function at discharge (05/10/2016)   Time 6   Period Weeks   Status Achieved               Plan - August 01, 2016 1726    Clinical Impression Statement [name] states pain is at a 4/10 today and she has improve trunk mobility and bil LE strength since evaluation. She met or partially met all goals.  she continues to rpeort pain in the R side but is able to control it with stretching and exercise which she reports she isn't consistent with. Reviewed exercises with her specifically for correct mechanics and position to avoid confusion. pt is able to maintain and progress her current level of function independently and will be discharged from PT today.    PT Next Visit Plan discharged from PT   PT Home Exercise Plan R sidelying with L LLE crossed over doing thoracic mobility and LUE strengthening, LTR holding knees to the right to stretch, QL stretching   Consulted and Agree with Plan of Care Patient      Patient will benefit from skilled therapeutic intervention in order to improve the following deficits and impairments:  Pain, Improper body mechanics, Postural dysfunction, Hypomobility, Impaired flexibility, Decreased strength, Decreased mobility, Decreased activity tolerance, Decreased endurance, Decreased balance, Increased fascial restricitons, Decreased range of motion  Visit Diagnosis: Abnormal posture  Muscle weakness (generalized)  Right-sided low back pain with right-sided sciatica, unspecified chronicity       G-Codes - 2016/08/01 1731    Functional Assessment Tool  Used FOTO/ clinical judgement    Functional Limitation Changing and maintaining body position   Changing and Maintaining Body Position Goal Status (N8295) At least 20 percent but less than 40 percent impaired, limited or  restricted   Changing and Maintaining Body Position Discharge Status [phone]) At least 20 percent but less than 40 percent impaired, limited or restricted      Problem List There are no active problems to display for this patient.  Starr Lake PT, DPT, LAT, ATC  07/06/16  5:32 PM   [facility] [address] [place], [place], [address] Phone: [phone]   Fax:  [phone]  Name: Meghan Stanley MRN: [id number] Date of Birth: 04/05/1946    PHYSICAL THERAPY DISCHARGE SUMMARY  Visits from Start of Care: 8  Current functional level related to goals / functional outcomes: See goals   Remaining deficits: Intermittent R flank pain/ tightness with walking/ standing relieved with sitting/ lying and stretching/ exercises. Pt reports not being consistent with exercises but has decreased pain with exercise.    Education / Equipment: HEP, posture, gait training.   Plan: Patient agrees to discharge.  Patient goals were partially met. Patient is being discharged due to being pleased with the current functional level.  ?????

## 2016-07-20 ENCOUNTER — Ambulatory Visit: Payer: Medicare Other | Admitting: Physical Therapy

## 2016-08-03 ENCOUNTER — Encounter: Payer: Self-pay | Admitting: Physical Therapy

## 2016-09-27 ENCOUNTER — Encounter (HOSPITAL_COMMUNITY): Payer: Self-pay | Admitting: Emergency Medicine

## 2016-09-27 ENCOUNTER — Inpatient Hospital Stay (HOSPITAL_COMMUNITY)
Admission: EM | Admit: 2016-09-27 | Discharge: 2016-09-29 | DRG: 812 | Disposition: A | Discharge status: Home / Self Care | Payer: Medicare Other | Attending: Internal Medicine | Admitting: Internal Medicine

## 2016-09-27 DIAGNOSIS — Z791 Long term (current) use of non-steroidal anti-inflammatories (NSAID): Secondary | ICD-10-CM

## 2016-09-27 DIAGNOSIS — M545 Low back pain: Secondary | ICD-10-CM | POA: Diagnosis present

## 2016-09-27 DIAGNOSIS — D473 Essential (hemorrhagic) thrombocythemia: Secondary | ICD-10-CM

## 2016-09-27 DIAGNOSIS — D124 Benign neoplasm of descending colon: Secondary | ICD-10-CM

## 2016-09-27 DIAGNOSIS — R739 Hyperglycemia, unspecified: Secondary | ICD-10-CM

## 2016-09-27 DIAGNOSIS — D75839 Thrombocytosis, unspecified: Secondary | ICD-10-CM

## 2016-09-27 DIAGNOSIS — K222 Esophageal obstruction: Secondary | ICD-10-CM

## 2016-09-27 DIAGNOSIS — D649 Anemia, unspecified: Secondary | ICD-10-CM | POA: Diagnosis present

## 2016-09-27 DIAGNOSIS — D122 Benign neoplasm of ascending colon: Secondary | ICD-10-CM

## 2016-09-27 DIAGNOSIS — K449 Diaphragmatic hernia without obstruction or gangrene: Secondary | ICD-10-CM

## 2016-09-27 DIAGNOSIS — M419 Scoliosis, unspecified: Secondary | ICD-10-CM

## 2016-09-27 DIAGNOSIS — D500 Iron deficiency anemia secondary to blood loss (chronic): Secondary | ICD-10-CM | POA: Diagnosis not present

## 2016-09-27 DIAGNOSIS — Q438 Other specified congenital malformations of intestine: Secondary | ICD-10-CM

## 2016-09-27 DIAGNOSIS — I714 Abdominal aortic aneurysm, without rupture: Secondary | ICD-10-CM | POA: Diagnosis present

## 2016-09-27 DIAGNOSIS — I10 Essential (primary) hypertension: Secondary | ICD-10-CM | POA: Diagnosis present

## 2016-09-27 DIAGNOSIS — K219 Gastro-esophageal reflux disease without esophagitis: Secondary | ICD-10-CM | POA: Diagnosis present

## 2016-09-27 DIAGNOSIS — K648 Other hemorrhoids: Secondary | ICD-10-CM | POA: Diagnosis present

## 2016-09-27 DIAGNOSIS — M791 Myalgia: Secondary | ICD-10-CM | POA: Diagnosis present

## 2016-09-27 DIAGNOSIS — K5730 Diverticulosis of large intestine without perforation or abscess without bleeding: Secondary | ICD-10-CM | POA: Diagnosis present

## 2016-09-27 HISTORY — DX: Gastro-esophageal reflux disease without esophagitis: K21.9

## 2016-09-27 HISTORY — DX: Anemia, unspecified: D64.9

## 2016-09-27 HISTORY — DX: Scoliosis, unspecified: M41.9

## 2016-09-27 LAB — COMPREHENSIVE METABOLIC PANEL
ALT: 11 U/L — AB (ref 14–54)
AST: 18 U/L (ref 15–41)
Albumin: 3.7 g/dL (ref 3.5–5.0)
Alkaline Phosphatase: 64 U/L (ref 38–126)
Anion gap: 7 (ref 5–15)
BILIRUBIN TOTAL: 0.7 mg/dL (ref 0.3–1.2)
BUN: 12 mg/dL (ref 6–20)
CO2: 25 mmol/L (ref 22–32)
CREATININE: 0.74 mg/dL (ref 0.44–1.00)
Calcium: 9.2 mg/dL (ref 8.9–10.3)
Chloride: 107 mmol/L (ref 101–111)
GFR calc Af Amer: >60 mL/min (ref >60)
GLUCOSE: 115 mg/dL — AB (ref 65–99)
Potassium: 3.7 mmol/L (ref 3.5–5.1)
Sodium: 139 mmol/L (ref 135–145)
TOTAL PROTEIN: 7.4 g/dL (ref 6.5–8.1)

## 2016-09-27 LAB — PREPARE RBC (CROSSMATCH)

## 2016-09-27 LAB — CBC
HCT: 21 % — ABNORMAL LOW (ref 36.0–46.0)
Hemoglobin: 5.4 g/dL — CL (ref 12.0–15.0)
MCH: 15.3 pg — ABNORMAL LOW (ref 26.0–34.0)
MCHC: 25.7 g/dL — AB (ref 30.0–36.0)
MCV: 59.3 fL — AB (ref 78.0–100.0)
PLATELETS: 702 10*3/uL — AB (ref 150–400)
RBC: 3.54 MIL/uL — ABNORMAL LOW (ref 3.87–5.11)
RDW: 30.7 % — AB (ref 11.5–15.5)
WBC: 9.7 10*3/uL (ref 4.0–10.5)

## 2016-09-27 LAB — IRON AND TIBC
IRON: 5 ug/dL — AB (ref 28–170)
Saturation Ratios: 1 % — ABNORMAL LOW (ref 10.4–31.8)
TIBC: 542 ug/dL — AB (ref 250–450)
UIBC: 537 ug/dL

## 2016-09-27 LAB — VITAMIN B12: Vitamin B-12: 374 pg/mL (ref 180–914)

## 2016-09-27 LAB — FERRITIN: FERRITIN: 2 ng/mL — AB (ref 11–307)

## 2016-09-27 LAB — RETICULOCYTES
RBC.: 3.86 MIL/uL — ABNORMAL LOW (ref 3.87–5.11)
RETIC CT PCT: 1 % (ref 0.4–3.1)
Retic Count, Absolute: 38.6 10*3/uL (ref 19.0–186.0)

## 2016-09-27 LAB — FOLATE: Folate: 25.6 ng/mL (ref >5.9)

## 2016-09-27 LAB — ABO/RH: ABO/RH(D): A POS

## 2016-09-27 LAB — POC OCCULT BLOOD, ED: Fecal Occult Bld: NEGATIVE

## 2016-09-27 MED ORDER — BOOST / RESOURCE BREEZE PO LIQD
1.0000 | Freq: Three times a day (TID) | ORAL | Status: DC
  Administered 2016-09-27: 1 via ORAL

## 2016-09-27 MED ORDER — SODIUM CHLORIDE 0.9 % IV SOLN
INTRAVENOUS | Status: DC
  Administered 2016-09-27 – 2016-09-28 (×2): via INTRAVENOUS

## 2016-09-27 MED ORDER — ENSURE ENLIVE PO LIQD: 237.0000 mL | Freq: Two times a day (BID) | ORAL | Status: DC

## 2016-09-27 MED ORDER — PANTOPRAZOLE SODIUM 40 MG IV SOLR
40.0000 mg | Freq: Once | INTRAVENOUS | Status: AC
  Administered 2016-09-27: 40 mg via INTRAVENOUS
  Filled 2016-09-27: qty 40

## 2016-09-27 MED ORDER — ACETAMINOPHEN 650 MG RE SUPP: 650.0000 mg | Freq: Four times a day (QID) | RECTAL | Status: DC | PRN

## 2016-09-27 MED ORDER — SODIUM CHLORIDE 0.9 % IV SOLN
10.0000 mL/h | Freq: Once | INTRAVENOUS | Status: AC
  Administered 2016-09-27: 10 mL/h via INTRAVENOUS

## 2016-09-27 MED ORDER — ONDANSETRON HCL 4 MG/2ML IJ SOLN: 4.0000 mg | Freq: Four times a day (QID) | INTRAMUSCULAR | Status: DC | PRN

## 2016-09-27 MED ORDER — TRAMADOL HCL 50 MG PO TABS
50.0000 mg | ORAL_TABLET | Freq: Four times a day (QID) | ORAL | Status: DC | PRN
  Administered 2016-09-28 – 2016-09-29 (×3): 50 mg via ORAL
  Filled 2016-09-27 (×3): qty 1

## 2016-09-27 MED ORDER — SODIUM CHLORIDE 0.9% FLUSH
3.0000 mL | Freq: Two times a day (BID) | INTRAVENOUS | Status: DC
  Administered 2016-09-27 – 2016-09-28 (×2): 3 mL via INTRAVENOUS

## 2016-09-27 MED ORDER — ACETAMINOPHEN 325 MG PO TABS: 650.0000 mg | ORAL_TABLET | Freq: Four times a day (QID) | ORAL | Status: DC | PRN

## 2016-09-27 MED ORDER — ONDANSETRON HCL 4 MG PO TABS: 4.0000 mg | ORAL_TABLET | Freq: Four times a day (QID) | ORAL | Status: DC | PRN

## 2016-09-27 NOTE — ED Notes (Signed)
PATIENT SIGNED FOR BLOOD CONSENT. WITNESSED BY Duard Larsen RN.

## 2016-09-27 NOTE — H&P (Signed)
History and Physical    Meghan Stanley [id number] DOB: 08-Jan-1946 DOA: 09/27/2016  PCP: Haywood Pao, MD   Patient coming from: Home; 40 Office  Chief Complaint: Abnormal Lab; Generalized Weakness  HPI: Meghan Stanley is a 71 y.o. female with medical history significant of prior Anemia and Scoliosis who was sent by PCP because of an Abnormal Hb of 5.4; Patient states over the last month or so she has gotten more weak, tired, and fatigued and sometimes gets dizzy. States she has had increased acid reflux over this time as well and states that over the weekend she vomited and it was not normal vomit but darker. Did not describe it as coffee ground emesis but states it was not normal vomitus. Has never had a colonoscopy or EGD. States she has some lower Right abdominal discomfort as well as back pain but attributed it to her scoliosis. Has been taking intermittent mobic since July for her pain. Denies any other complaints and no fevers, chills, nightsweats.   ED Course: Had basic blood work done, Anemia Panel Drawn, transfused 3 units of pRBC's, and had GI consulted by EDP.   Review of Systems: As per HPI otherwise 10 point review of systems negative. No constipation, diarrhea, Cp, SOB. States she initially hurts she starts to ambulate because of scoliosis. No recent falls.   Past Medical History:  Diagnosis Date  . Anemia   . Scoliosis     Past Surgical History:  Procedure Laterality Date  . NO PAST SURGERIES     SOCIAL HISTORY  reports that she has never smoked. She has never used smokeless tobacco. She reports that she does not drink alcohol or use drugs.  No Known Allergies  FAMILY HISTORY Reviewed with the patient and patient's Mother has GERD and HTN; Patient's Father is deceased and had Heart Problems.  Prior to Admission medications   Medication Sig Start Date End Date Taking? Authorizing Provider  acetaminophen (TYLENOL) 500 MG tablet Take 1,000 mg by mouth  every 6 (six) hours as needed.   Yes Historical Provider, MD  meloxicam (MOBIC) 7.5 MG tablet Take 7.5 mg by mouth 2 (two) times daily.   Yes Historical Provider, MD    Physical Exam: Vitals:   09/27/16 [id number] 09/27/16 1730 09/27/16 1811 09/27/16 1831  BP: 153/79 155/71 152/78 141/87  Pulse: 95 92 96 92  Resp: 18 12 16 16   Temp:   98.4 F (36.9 C) 98.6 F (37 C)  TempSrc:   Oral Oral  SpO2: 100% 100% 100% 100%  Weight:      Height:       Constitutional: WN/WD, NAD and appears calm and comfortable Eyes: Lids normal; Has some conjunctival, sclerae anicteric  ENMT: External Ears, Nose appear normal. Grossly normal hearing.  Neck: Appears normal, supple, no cervical masses, normal ROM, no appreciable thyromegaly, no JVD Respiratory: Clear to auscultation bilaterally, no wheezing, rales, rhonchi or crackles. Normal respiratory effort and patient is not tachypenic. No accessory muscle use.  Cardiovascular: RRR, no murmurs / rubs / gallops. S1 and S2 auscultated. No extremity edema. 2+ pedal pulses. No carotid bruits.  Abdomen: Soft, non-tender, non-distended. No masses palpated. No appreciable hepatosplenomegaly. Bowel sounds positive x4 GU: Deferred. Musculoskeletal: No clubbing / cyanosis of digits/nails. Right Hip is Higher than left.  Skin: No rashes, lesions, ulcers on limited skin evaluation. No induration; Warm and dry.  Neurologic: CN 2-12 grossly intact with no focal deficits. Sensation intact in all 4 Extremities. Strength 5/5  in all 4. Romberg sign cerebellar reflexes not assessed.  Psychiatric: Normal judgment and insight. Alert and oriented x 3. Normal mood and appropriate affect.   Labs on Admission: I have personally reviewed following labs and imaging studies  CBC:  Recent Labs Lab 09/27/16 1328  WBC 9.7  HGB 5.4*  HCT 21.0*  MCV 59.3*  PLT [id number]*   Basic Metabolic Panel:  Recent Labs Lab 09/27/16 1328  NA 139  K 3.7  CL 107  CO2 25  GLUCOSE 115*  BUN 12    CREATININE 0.74  CALCIUM 9.2   GFR: Estimated Creatinine Clearance: 60.4 mL/min (by C-G formula based on SCr of 0.74 mg/dL). Liver Function Tests:  Recent Labs Lab 09/27/16 1328  AST 18  ALT 11*  ALKPHOS 64  BILITOT 0.7  PROT 7.4  ALBUMIN 3.7   No results for input(s): LIPASE, AMYLASE in the last 168 hours. No results for input(s): AMMONIA in the last 168 hours. Coagulation Profile: No results for input(s): INR, PROTIME in the last 168 hours. Cardiac Enzymes: No results for input(s): CKTOTAL, CKMB, CKMBINDEX, TROPONINI in the last 168 hours. BNP (last 3 results) No results for input(s): PROBNP in the last 8760 hours. HbA1C: No results for input(s): HGBA1C in the last 72 hours. CBG: No results for input(s): GLUCAP in the last 168 hours. Lipid Profile: No results for input(s): CHOL, HDL, LDLCALC, TRIG, CHOLHDL, LDLDIRECT in the last 72 hours. Thyroid Function Tests: No results for input(s): TSH, T4TOTAL, FREET4, T3FREE, THYROIDAB in the last 72 hours. Anemia Panel:  Recent Labs  09/27/16 1710  RETICCTPCT 1.0   Urine analysis: No results found for: COLORURINE, APPEARANCEUR, LABSPEC, PHURINE, GLUCOSEU, HGBUR, BILIRUBINUR, KETONESUR, PROTEINUR, UROBILINOGEN, NITRITE, LEUKOCYTESUR Sepsis Labs: !!!!!!!!!!!!!!!!!!!!!!!!!!!!!!!!!!!!!!!!!!!! @LABRCNTIP (procalcitonin:4,lacticidven:4) )No results found for this or any previous visit (from the past 240 hour(s)).   Radiological Exams on Admission: No results found.  EKG: No EKG done admission.   Assessment/Plan Active Problems:   Symptomatic anemia   Thrombocytosis (HCC)   Hyperglycemia   Scoliosis  Symptomatic Microcytic Anemia -Place in Obs Telemetry; Currently Hemodynamically stable -Has Hx of Anemia and Menorrhagia  -Hb/Hct on Admission was 5.4/21.0; No prior Baseline -FOBT x1 Negative; No endorsement of Melena, Hematochezia, Hematuria, or Vaginal Bleeding -MCV showed 59.3 -Check Anemia Panel with Iron Level   -Reticulocyte Count 1.0 -Transfusing 3 units of pRBC by EDP -? Upper GIB as she was having dark color emesis x 2; Has been taking Mobic intermittently and has been having more Reflux; GI Consulted by EDP -Two Large Bore IV's -IVF at 75 mL/hr -Will obtain CT Abd/Pelvis w/o Contrast to evaluate for Retroperitoneal Hematoma -Repeat CBC in AM  Thrombocytosis -Likely Reactive  -Continue to Monitor  Hyperglycemia -Obtain HbA1c  Scoliosis -Pain Control with Acetaminophen  DVT prophylaxis: SCDs Code Status: FULL CODE Family Communication: Family  Disposition Plan: Home Consults called: Gastroenterology Admission status: [place], D.O. [facility] Pager [phone]  If 7PM-7AM, please contact night-coverage [email or web address] Password [facility]  09/27/2016, 6:57 PM

## 2016-09-27 NOTE — ED Notes (Signed)
Gave report to Justin, RN.

## 2016-09-27 NOTE — ED Triage Notes (Signed)
Pt send from PCP post wellness check of hemoglobin of 5.4. Pt denies bleeding or blood thinners.

## 2016-09-27 NOTE — ED Notes (Signed)
5.4 [facility] staff notified.

## 2016-09-27 NOTE — ED Provider Notes (Addendum)
Meghan Stanley   CSN: [id number] Arrival date & time: 09/27/16  1503     History   Chief Complaint Chief Complaint  Patient presents with  . Abnormal Lab    HPI Meghan Stanley is a 71 y.o. female.  HPI  Pt comes in with cc of abnormal Hb. Pt reports that she went to PCP and had routine blood work, which showed anemia. Pt denies any abd pain, bloody or tarry stools. Pt has no abd pain. Pt reports R sided hip pain and lower quadrant pain. Pt takes [place] occasionally. No hx of blood transfusion. Pt does report that the last few days  she has been having weakness. She denies significant weight loss or night sweats.  Past Medical History:  Diagnosis Date  . Anemia   . Scoliosis     Patient Active Problem List   Diagnosis Date Noted  . Symptomatic anemia 09/27/2016    Past Surgical History:  Procedure Laterality Date  . NO PAST SURGERIES      OB History    No data available       Home Medications    Prior to Admission medications   Medication Sig Start Date End Date Taking? Authorizing Provider  acetaminophen (TYLENOL) 500 MG tablet Take 1,000 mg by mouth every 6 (six) hours as needed.   Yes Historical Provider, MD  meloxicam (MOBIC) 7.5 MG tablet Take 7.5 mg by mouth 2 (two) times daily.   Yes Historical Provider, MD    Family History History reviewed. No pertinent family history.  Social History Social History  Substance Use Topics  . Smoking status: Never Smoker  . Smokeless tobacco: Never Used  . Alcohol use No     Allergies   Patient has no known allergies.   Review of Systems Review of Systems  ROS 10 Systems reviewed and are negative for acute change except as noted in the HPI.     Physical Exam Updated Vital Signs BP 155/71   Pulse 92   Temp 98.6 F (37 C) (Oral)   Resp 12   Ht 5\' 3"  (1.6 m)   Wt 149 lb (67.6 kg)   SpO2 100%   BMI 26.39 kg/m   Physical Exam  Constitutional: She is oriented to person,  place, and time. She appears well-developed and well-nourished.  HENT:  Head: Normocephalic and atraumatic.  Eyes: EOM are normal. Pupils are equal, round, and reactive to light.  Neck: Neck supple.  Cardiovascular: Normal rate, regular rhythm and normal heart sounds.   No murmur heard. Pulmonary/Chest: Effort normal. No respiratory distress.  Abdominal: Soft. She exhibits no distension and no mass. There is no tenderness. There is no rebound and no guarding.  DRE reveals no melena or BRBPR  Neurological: She is alert and oriented to person, place, and time.  Skin: Skin is warm and dry.  Nursing Stanley and vitals reviewed.    ED Treatments / Results  Labs (all labs ordered are listed, but only abnormal results are displayed) Labs Reviewed  COMPREHENSIVE METABOLIC PANEL - Abnormal; Notable for the following:       Result Value   Glucose, Bld 115 (*)    ALT 11 (*)    All other components within normal limits  CBC - Abnormal; Notable for the following:    RBC 3.54 (*)    Hemoglobin 5.4 (*)    HCT 21.0 (*)    MCV 59.3 (*)    MCH 15.3 (*)  MCHC 25.7 (*)    RDW 30.7 (*)    Platelets 702 (*)    All other components within normal limits  RETICULOCYTES - Abnormal; Notable for the following:    RBC. 3.86 (*)    All other components within normal limits  VITAMIN B12  FOLATE  IRON AND TIBC  FERRITIN  POC OCCULT BLOOD, ED  POC OCCULT BLOOD, ED  TYPE AND SCREEN  PREPARE RBC (CROSSMATCH)  ABO/RH    EKG  EKG Interpretation None       Radiology No results found.  Procedures Procedures (including critical care time)  CRITICAL CARE Performed by: Varney Biles   Total critical care time: 35 minutes  Critical care time was exclusive of separately billable procedures and treating other patients.  Critical care was necessary to treat or prevent imminent or life-threatening deterioration.  Critical care was time spent personally by me on the following activities:  development of treatment plan with patient and/or surrogate as well as nursing, discussions with consultants, evaluation of patient's response to treatment, examination of patient, obtaining history from patient or surrogate, ordering and performing treatments and interventions, ordering and review of laboratory studies, ordering and review of radiographic studies, pulse oximetry and re-evaluation of patient's condition.   Medications Ordered in ED Medications  0.9 %  sodium chloride infusion (not administered)     Initial Impression / Assessment and Plan / ED Course  I have reviewed the triage vital signs and the nursing notes.  Pertinent labs & imaging results that were available during my care of the patient were reviewed by me and considered in my medical decision making (see chart for details).  Clinical Course as of Sep 27 1799  Mon Sep 27, 2016  1801 Pt informed Dr. Chana Bode that she did have emesis with dark stuff at some point last week. She had informed me that she only had nausea. Calling GI per hospitalists request.  [AN]    Clinical Course User Index [AN] Varney Biles, MD   Pt comes in with cc of anemia. DRE shows guaiac neg stools.  Pt will need transfusion and admission for further workup. Anemia panel ordered.  Final Clinical Impressions(s) / ED Diagnoses   Final diagnoses:  Symptomatic anemia    New Prescriptions New Prescriptions   No medications on file     Varney Biles, MD 09/27/16 [place], MD 09/27/16 [place], MD 09/27/16 [id number]

## 2016-09-28 ENCOUNTER — Encounter (HOSPITAL_COMMUNITY): Payer: Self-pay

## 2016-09-28 ENCOUNTER — Observation Stay (HOSPITAL_COMMUNITY): Payer: Medicare Other

## 2016-09-28 DIAGNOSIS — R112 Nausea with vomiting, unspecified: Secondary | ICD-10-CM | POA: Diagnosis not present

## 2016-09-28 DIAGNOSIS — D473 Essential (hemorrhagic) thrombocythemia: Secondary | ICD-10-CM | POA: Diagnosis not present

## 2016-09-28 DIAGNOSIS — R935 Abnormal findings on diagnostic imaging of other abdominal regions, including retroperitoneum: Secondary | ICD-10-CM | POA: Diagnosis not present

## 2016-09-28 DIAGNOSIS — K648 Other hemorrhoids: Secondary | ICD-10-CM | POA: Diagnosis present

## 2016-09-28 DIAGNOSIS — D649 Anemia, unspecified: Secondary | ICD-10-CM | POA: Diagnosis present

## 2016-09-28 DIAGNOSIS — K222 Esophageal obstruction: Secondary | ICD-10-CM | POA: Diagnosis present

## 2016-09-28 DIAGNOSIS — K449 Diaphragmatic hernia without obstruction or gangrene: Secondary | ICD-10-CM

## 2016-09-28 DIAGNOSIS — K219 Gastro-esophageal reflux disease without esophagitis: Secondary | ICD-10-CM | POA: Diagnosis present

## 2016-09-28 DIAGNOSIS — Z791 Long term (current) use of non-steroidal anti-inflammatories (NSAID): Secondary | ICD-10-CM | POA: Diagnosis not present

## 2016-09-28 DIAGNOSIS — M791 Myalgia: Secondary | ICD-10-CM | POA: Diagnosis present

## 2016-09-28 DIAGNOSIS — M545 Low back pain: Secondary | ICD-10-CM | POA: Diagnosis present

## 2016-09-28 DIAGNOSIS — D122 Benign neoplasm of ascending colon: Secondary | ICD-10-CM | POA: Diagnosis present

## 2016-09-28 DIAGNOSIS — I10 Essential (primary) hypertension: Secondary | ICD-10-CM | POA: Diagnosis present

## 2016-09-28 DIAGNOSIS — R739 Hyperglycemia, unspecified: Secondary | ICD-10-CM | POA: Diagnosis not present

## 2016-09-28 DIAGNOSIS — I714 Abdominal aortic aneurysm, without rupture: Secondary | ICD-10-CM | POA: Diagnosis present

## 2016-09-28 DIAGNOSIS — D500 Iron deficiency anemia secondary to blood loss (chronic): Principal | ICD-10-CM

## 2016-09-28 DIAGNOSIS — K5730 Diverticulosis of large intestine without perforation or abscess without bleeding: Secondary | ICD-10-CM | POA: Diagnosis present

## 2016-09-28 DIAGNOSIS — Q438 Other specified congenital malformations of intestine: Secondary | ICD-10-CM | POA: Diagnosis not present

## 2016-09-28 DIAGNOSIS — M419 Scoliosis, unspecified: Secondary | ICD-10-CM | POA: Diagnosis present

## 2016-09-28 DIAGNOSIS — D124 Benign neoplasm of descending colon: Secondary | ICD-10-CM | POA: Diagnosis present

## 2016-09-28 LAB — COMPREHENSIVE METABOLIC PANEL
ALT: 11 U/L — AB (ref 14–54)
ANION GAP: 6 (ref 5–15)
AST: 17 U/L (ref 15–41)
Albumin: 3.5 g/dL (ref 3.5–5.0)
Alkaline Phosphatase: 62 U/L (ref 38–126)
BUN: 8 mg/dL (ref 6–20)
CHLORIDE: 106 mmol/L (ref 101–111)
CO2: 26 mmol/L (ref 22–32)
Calcium: 9.2 mg/dL (ref 8.9–10.3)
Creatinine, Ser: 0.61 mg/dL (ref 0.44–1.00)
GFR calc non Af Amer: >60 mL/min (ref >60)
Glucose, Bld: 85 mg/dL (ref 65–99)
Potassium: 3.9 mmol/L (ref 3.5–5.1)
SODIUM: 138 mmol/L (ref 135–145)
Total Bilirubin: 3.8 mg/dL — ABNORMAL HIGH (ref 0.3–1.2)
Total Protein: 6.5 g/dL (ref 6.5–8.1)

## 2016-09-28 LAB — MAGNESIUM: MAGNESIUM: 1.8 mg/dL (ref 1.7–2.4)

## 2016-09-28 LAB — CBC
HCT: 29.8 % — ABNORMAL LOW (ref 36.0–46.0)
Hemoglobin: 8.8 g/dL — ABNORMAL LOW (ref 12.0–15.0)
MCH: 19.3 pg — AB (ref 26.0–34.0)
MCHC: 29.5 g/dL — ABNORMAL LOW (ref 30.0–36.0)
MCV: 65.4 fL — ABNORMAL LOW (ref 78.0–100.0)
PLATELETS: 570 10*3/uL — AB (ref 150–400)
RBC: 4.56 MIL/uL (ref 3.87–5.11)
RDW: 30 % — ABNORMAL HIGH (ref 11.5–15.5)
WBC: 8.3 10*3/uL (ref 4.0–10.5)

## 2016-09-28 LAB — PROTIME-INR
INR: 1.06
Prothrombin Time: 13.8 seconds (ref 11.4–15.2)

## 2016-09-28 LAB — TSH: TSH: 3.097 u[IU]/mL (ref 0.350–4.500)

## 2016-09-28 MED ORDER — PEG-KCL-NACL-NASULF-NA ASC-C 100 G PO SOLR: 1.0000 | Freq: Once | ORAL | Status: DC

## 2016-09-28 MED ORDER — PEG-KCL-NACL-NASULF-NA ASC-C 100 G PO SOLR
0.5000 | Freq: Once | ORAL | Status: AC
  Administered 2016-09-28: 100 g via ORAL
  Filled 2016-09-28: qty 1

## 2016-09-28 MED ORDER — HYDRALAZINE HCL 20 MG/ML IJ SOLN
5.0000 mg | Freq: Four times a day (QID) | INTRAMUSCULAR | Status: DC | PRN
  Administered 2016-09-28 – 2016-09-29 (×2): 5 mg via INTRAVENOUS
  Filled 2016-09-28 (×2): qty 1

## 2016-09-28 MED ORDER — PEG-KCL-NACL-NASULF-NA ASC-C 100 G PO SOLR
0.5000 | Freq: Once | ORAL | Status: AC
  Administered 2016-09-29: 100 g via ORAL

## 2016-09-28 NOTE — Consult Note (Signed)
Referring Provider: [facility]   Primary Care Physician:  Haywood Pao, MD Primary Gastroenterologist:  Althia Forts  Reason for Consultation:  anemia  ASSESSMENT AND PLAN:   65. 71 yo female with severe iron deficiency anemia without overt GI bleeding and negative occult blood test. Erosions / PUD from NSAIDS ? Chronic bleeding related to large hiatal hernia?  Other considerations include AVMs, GI neoplasm -patient will be scheduled for EGD / colonoscopy to be done in am. Risk and benefits of procedures discussed and she agrees to proceed.   3. Chronic RLQ pain. Started as right low back. No acute findings on CTscan but severely limited study. Musculoskeletal pain?   4. GERD, developed over last several months and worse at night. Also intermittent nausea / vomiting for a year. She has a LARGE hiatal hernia with much of stomach in her chest on CTscan.  -PPI -Anti-reflux measures -? Outpatient surgical evaluation  HPI: Meghan Stanley is a 71 y.o. female sent to ED by PCP yesterday after routine labs turned up a hgb of 5.4. MCV 64. Ferritin of 2. She received 3 units of blood and hgb up to 8.8. No overt bleeding, FOBT negative. She takes an occasional mobic. Over the last year she has had less energy, felt 'winded" at times.   Patient admits to intermittent nausea and vomiting, mostly postprandial for a year or more. She is unsure about any weight loss but has noticed clothes big on her. No hematemesis. BMs are at baseline. For a year she has had intermittent RLQ pain. This started as low back pain but now radiates around to RLQ and even into right groin. Pain not related to bowel movement. No urinary symptoms.     She admits to dizziness, weakness Past Medical History:  Diagnosis Date  . Anemia   . Scoliosis     Past Surgical History:  Procedure Laterality Date  . NO PAST SURGERIES      Prior to Admission medications   Medication Sig Start Date End Date Taking?  Authorizing Provider  acetaminophen (TYLENOL) 500 MG tablet Take 1,000 mg by mouth every 6 (six) hours as needed.   Yes Historical Provider, MD  meloxicam (MOBIC) 7.5 MG tablet Take 7.5 mg by mouth 2 (two) times daily.   Yes Historical Provider, MD    Current Facility-Administered Medications  Medication Dose Route Frequency Provider Last Rate Last Dose  . 0.9 %  sodium chloride infusion   Intravenous Continuous Kerney Elbe, DO 75 mL/hr at 09/27/16 2129    . acetaminophen (TYLENOL) tablet 650 mg  650 mg Oral Q6H PRN Kerney Elbe, DO       Or  . acetaminophen (TYLENOL) suppository 650 mg  650 mg Rectal Q6H PRN [place], DO      . feeding supplement (BOOST / RESOURCE BREEZE) liquid 1 Container  1 Container Oral TID BM Kerney Elbe, DO   1 Container at 09/27/16 2140  . feeding supplement (ENSURE ENLIVE) (ENSURE ENLIVE) liquid 237 mL  237 mL Oral BID BM Omair Latif Sheikh, DO      . ondansetron [facility]) tablet 4 mg  4 mg Oral Q6H PRN Kerney Elbe, DO       Or  . ondansetron [facility]) injection 4 mg  4 mg Intravenous Q6H PRN Omair Latif Sheikh, DO      . sodium chloride flush (NS) 0.9 % injection 3 mL  3 mL Intravenous Q12H Kerney Elbe, DO  3 mL at 09/27/16 2200  . traMADol (ULTRAM) tablet 50 mg  50 mg Oral Q6H PRN Bertram Savin Sheikh, DO   50 mg at 09/28/16 0014    Allergies as of 09/27/2016  . (No Known Allergies)   No [place] of GI malignancies.  History reviewed. No pertinent family history.  Social History   Social History  . Marital status: Unknown    Spouse name: N/A  . Number of children: N/A  . Years of education: N/A   Occupational History  . Not on file.   Social History Main Topics  . Smoking status: Never Smoker  . Smokeless tobacco: Never Used  . Alcohol use No  . Drug use: No  . Sexual activity: Not on file   Other Topics Concern  . Not on file   Social History Narrative  . No narrative on file    Review of  Systems: All systems reviewed and negative except where noted in HPI.  Physical Exam: Vital signs in last 24 hours: Temp:  [97.9 F (36.6 C)-98.9 F (37.2 C)] 98.1 F (36.7 C) (01/23 0512) Pulse Rate:  [77-112] 77 (01/23 0512) Resp:  [12-18] 18 (01/23 0512) BP: (123-168)/(70-87) 145/83 (01/23 0512) SpO2:  [98 %-100 %] 100 % (01/23 0512) Weight:  [131 lb 2.8 oz (59.5 kg)-149 lb (67.6 kg)] 131 lb 2.8 oz (59.5 kg) (01/23 0512) Last BM Date: 09/26/16 General:   Alert,  Thin black female in NAD Head:  Normocephalic and atraumatic. Eyes:  Sclera clear, no icterus.   Conjunctiva pink. Ears:  Normal auditory acuity. Nose:  No deformity, discharge,  or lesions. Mouth:  No deformity or lesions.   Neck:  Supple; no masses Lungs:  Clear throughout to auscultation.   No wheezes, crackles, or rhonchi.  Heart:  Regular rate and rhythm; no murmurs, clicks, rubs,  or gallops. Abdomen:  Soft,nontender, BS active,nonpalp mass or hsm.   Rectal:  Deferred  Msk:  Symmetrical without gross deformities. . Pulses:  Normal pulses noted. Extremities:  Without clubbing or edema. Neurologic:  Alert and  oriented x4;  grossly normal neurologically. Skin:  Intact without significant lesions or rashes.. Psych:  Alert and cooperative. Normal mood and affect.  Intake/Output from previous day: 01/22 0701 - 01/23 0700 In: 1005 [Blood:1005] Out: -  Intake/Output this shift: Total I/O In: 360 [P.O.:360] Out: -   Lab Results:  Recent Labs  09/27/16 1328 09/28/16 0608  WBC 9.7 8.3  HGB 5.4* 8.8*  HCT 21.0* 29.8*  PLT 702* 570*   BMET  Recent Labs  09/27/16 1328 09/28/16 0608  NA 139 138  K 3.7 3.9  CL 107 106  CO2 25 26  GLUCOSE 115* 85  BUN 12 8  CREATININE 0.74 0.61  CALCIUM 9.2 9.2   LFT  Recent Labs  09/28/16 0608  PROT 6.5  ALBUMIN 3.5  AST 17  ALT 11*  ALKPHOS 62  BILITOT 3.8*   PT/INR  Recent Labs  09/28/16 0608  LABPROT 13.8  INR 1.06    Studies/Results: Ct  Abdomen Pelvis Wo Contrast  Result Date: 09/28/2016 CLINICAL DATA:  Anemia, possible retroperitoneal hematoma, scoliosis EXAM: CT ABDOMEN AND PELVIS WITHOUT CONTRAST TECHNIQUE: Multidetector CT imaging of the abdomen and pelvis was performed following the standard protocol without IV contrast. COMPARISON:  None. FINDINGS: Lower chest: Lung bases shows no acute findings. There is large left retrocardiac diaphragmatic hernia with herniation at least 2/3 of proximal stomach in retrocardiac position Hepatobiliary: Markedly limited study without  IV and oral contrast. Unenhanced liver shows no biliary ductal dilatation. No calcified gallstones are noted within gallbladder. Pancreas: Unenhanced pancreas with normal appearance. Spleen: Unenhanced spleen is normal. Adrenals/Urinary Tract: No adrenal gland mass. Unenhanced kidneys are symmetrical in size. Limited study without IV contrast. No nephrolithiasis. No hydronephrosis or hydroureter. No calcified ureteral calculi. Stomach/Bowel: There is no small bowel obstruction. Limited study without oral contrast. No thickened or dilated small bowel loops. There is no pericecal inflammation. Normal appendix partially visualized in coronal image 50. Some colonic stool noted in right colon transverse colon and descending colon. Some colonic stool noted within redundant sigmoid colon. Moderate stool and gas noted within rectum. No distal colonic obstruction. Vascular/Lymphatic: Atherosclerotic calcifications of abdominal aorta and iliac arteries. There is aneurysmal dilatation of infrarenal abdominal aorta measures 2.5 cm in diameter. 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 There is no periaortic leak on this unenhanced scan Reproductive: The uterus is somewhat atrophic minimal retroflexed no adnexal mass. Other: There is no evidence of mesenteric or retroperitoneal hematoma. No mesenteric adenopathy. Small nonspecific bilateral inguinal  lymph nodes are noted. The urinary bladder is under distended limiting its assessment. Musculoskeletal: There is significant levoscoliosis of upper lumbar spine. Dextroscoliosis lower thoracic spine. There are degenerative changes lumbar spine. Disc space flattening with vacuum disc phenomenon at L2-L3 level. Disc space flattening with vacuum disc phenomenon at L5-S1 level. Facet degenerative changes noted L4 and L5 level. IMPRESSION: 1. Markedly limited study without IV contrast. No evidence of mesenteric or retroperitoneal hematoma. 2. There is large left diaphragmatic hernia with at least 2/3 of proximal stomach herniated in retrocardiac position. No evidence of gastric outlet obstruction. No gastric volvulus on this unenhanced scan. 3. There is significant levoscoliosis and degenerative changes of the lumbar spine. 4. No small bowel or colonic obstruction. No pericecal inflammation. Normal appendix partially visualized. Some colonic stool noted throughout the colon. Moderate stool and gas noted within rectum. No distal colonic obstruction. 5. No adnexal mass.  Somewhat atrophic uterus. 6. Empty urinary bladder limiting its assessment. 7. There is aneurysmal dilatation of infrarenal abdominal aorta measures up to 2.5 cm in diameter. 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 Atherosclerotic calcifications of abdominal aorta and iliac arteries. Electronically Signed   By: Lahoma Crocker M.D.   On: 09/28/2016 08:59   Tye Savoy, NP-C @  09/28/2016, 9:27 AM  Pager number [phone]  GI ATTENDING  History, laboratories, x-rays reviewed. Patient personally seen and examined. Agree with comprehensive consultation note as outlined above. Patient presents with severe iron deficiency anemia. Also chronic problems with recurrent vomiting. Large hiatal hernia as noted. Suspect hiatal hernia is responsible for recurrent problems with vomiting. As well, may have Cameron erosions  on the hernia sac to explain anemia. The patient is for colonoscopy and upper endoscopy tomorrow to evaluate iron deficiency anemia and GI symptoms. The nature of the procedure, as well as the risks, benefits, and alternatives were carefully and thoroughly reviewed with the patient. Ample time for discussion and questions allowed. The patient understood, was satisfied, and agreed to proceed.Hemoglobin improved post transfusion. 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  Docia Chuck. Geri [place]in[place]., M.D. [facility] Division of Gastroenterology

## 2016-09-28 NOTE — Progress Notes (Signed)
PROGRESS NOTE    Meghan Stanley  [id number] DOB: October 26, 1945 DOA: 09/27/2016 PCP: Haywood Pao, MD    Brief Narrative:  71 y.o. female with medical history significant of prior Anemia and Scoliosis who was sent by PCP because of an Abnormal Hb of 5.4; Patient states over the last month or so she has gotten more weak, tired, and fatigued and sometimes gets dizzy. States she has had increased acid reflux over this time as well and states that over the weekend she vomited and it was not normal vomit but darker. Did not describe it as coffee ground emesis but states it was not normal vomitus. Has never had a colonoscopy or EGD. States she has some lower Right abdominal discomfort as well as back pain but attributed it to her scoliosis. Has been taking intermittent mobic since July for her pain. Denies any other complaints and no fevers, chills, nightsweats.   Assessment & Plan:   Active Problems:   Symptomatic anemia   Thrombocytosis (HCC)   Hyperglycemia   Scoliosis  Symptomatic Microcytic Anemia secondary to likely acute blood loss anemia -Hb/Hct on Admission was 5.4/21.0; No prior Baseline -FOBT x1 Negative -MCV showed 59.3 -Check Anemia Panel with Iron Level  -Reticulocyte Count 1.0 -Transfused 3 units of pRBC by EDP -GI consulted. Recs reviewed -CT Abd/Pelvis w/o Contrast neg for Retroperitoneal Hematoma -Will repeat cbc in AM  Thrombocytosis -Likely Reactive  -Repeat cbc in AM  Hyperglycemia -HbA1c pending -random glucose reviewed. Within normal limits  Scoliosis -Continue analgesics with Acetaminophen  DVT prophylaxis: SCD's Code Status: Full Family Communication: Pt in room, family not at bedside Disposition Plan: Uncertain at this time  Consultants:   GI  Procedures:     Antimicrobials: Anti-infectives    None       Subjective: Eager to go home  Objective: Vitals:   09/28/16 0025 09/28/16 0040 09/28/16 0400 09/28/16 0512  BP: 137/75 (!)  143/79 135/73 (!) 145/83  Pulse: 81 80 77 77  Resp: '16 16 16 18  ' Temp: 98.7 F (37.1 C) 98.8 F (37.1 C) 98.3 F (36.8 C) 98.1 F (36.7 C)  TempSrc: Oral Oral  Oral  SpO2: 100% 100%  100%  Weight:    59.5 kg (131 lb 2.8 oz)  Height:        Intake/Output Summary (Last 24 hours) at 09/28/16 1441 Last data filed at 09/28/16 1200  Gross per 24 hour  Intake             1785 ml  Output                0 ml  Net             1785 ml   Filed Weights   09/27/16 1522 09/28/16 0512  Weight: 67.6 kg (149 lb) 59.5 kg (131 lb 2.8 oz)    Examination:  General exam: Appears calm and comfortable  Respiratory system: Clear to auscultation. Respiratory effort normal. Cardiovascular system: S1 & S2 heard, RRR Gastrointestinal system: Abdomen is nondistended, soft and nontender. No organomegaly or masses felt. Normal bowel sounds heard. Central nervous system: Alert and oriented. No focal neurological deficits. Extremities: Symmetric 5 x 5 power. Skin: No rashes, lesions Psychiatry: Judgement and insight appear normal. Mood & affect appropriate.   Data Reviewed: I have personally reviewed following labs and imaging studies  CBC:  Recent Labs Lab 09/27/16 1328 09/28/16 0608  WBC 9.7 8.3  HGB 5.4* 8.8*  HCT 21.0* 29.8*  MCV 59.3* 65.4*  PLT 702* 384*   Basic Metabolic Panel:  Recent Labs Lab 09/27/16 1328 09/28/16 0608  NA 139 138  K 3.7 3.9  CL 107 106  CO2 25 26  GLUCOSE 115* 85  BUN 12 8  CREATININE 0.74 0.61  CALCIUM 9.2 9.2  MG  --  1.8   GFR: Estimated Creatinine Clearance: 54.1 mL/min (by C-G formula based on SCr of 0.61 mg/dL). Liver Function Tests:  Recent Labs Lab 09/27/16 1328 09/28/16 0608  AST 18 17  ALT 11* 11*  ALKPHOS 64 62  BILITOT 0.7 3.8*  PROT 7.4 6.5  ALBUMIN 3.7 3.5   No results for input(s): LIPASE, AMYLASE in the last 168 hours. No results for input(s): AMMONIA in the last 168 hours. Coagulation Profile:  Recent Labs Lab  09/28/16 0608  INR 1.06   Cardiac Enzymes: No results for input(s): CKTOTAL, CKMB, CKMBINDEX, TROPONINI in the last 168 hours. BNP (last 3 results) No results for input(s): PROBNP in the last 8760 hours. HbA1C: No results for input(s): HGBA1C in the last 72 hours. CBG: No results for input(s): GLUCAP in the last 168 hours. Lipid Profile: No results for input(s): CHOL, HDL, LDLCALC, TRIG, CHOLHDL, LDLDIRECT in the last 72 hours. Thyroid Function Tests:  Recent Labs  09/28/16 0608  TSH 3.097   Anemia Panel:  Recent Labs  09/27/16 1710  VITAMINB12 374  FOLATE 25.6  FERRITIN 2*  TIBC 542*  IRON 5*  RETICCTPCT 1.0   Sepsis Labs: No results for input(s): PROCALCITON, LATICACIDVEN in the last 168 hours.  No results found for this or any previous visit (from the past 240 hour(s)).   Radiology Studies: Ct Abdomen Pelvis Wo Contrast  Result Date: 09/28/2016 CLINICAL DATA:  Anemia, possible retroperitoneal hematoma, scoliosis EXAM: CT ABDOMEN AND PELVIS WITHOUT CONTRAST TECHNIQUE: Multidetector CT imaging of the abdomen and pelvis was performed following the standard protocol without IV contrast. COMPARISON:  None. FINDINGS: Lower chest: Lung bases shows no acute findings. There is large left retrocardiac diaphragmatic hernia with herniation at least 2/3 of proximal stomach in retrocardiac position Hepatobiliary: Markedly limited study without IV and oral contrast. Unenhanced liver shows no biliary ductal dilatation. No calcified gallstones are noted within gallbladder. Pancreas: Unenhanced pancreas with normal appearance. Spleen: Unenhanced spleen is normal. Adrenals/Urinary Tract: No adrenal gland mass. Unenhanced kidneys are symmetrical in size. Limited study without IV contrast. No nephrolithiasis. No hydronephrosis or hydroureter. No calcified ureteral calculi. Stomach/Bowel: There is no small bowel obstruction. Limited study without oral contrast. No thickened or dilated small  bowel loops. There is no pericecal inflammation. Normal appendix partially visualized in coronal image 50. Some colonic stool noted in right colon transverse colon and descending colon. Some colonic stool noted within redundant sigmoid colon. Moderate stool and gas noted within rectum. No distal colonic obstruction. Vascular/Lymphatic: Atherosclerotic calcifications of abdominal aorta and iliac arteries. There is aneurysmal dilatation of infrarenal abdominal aorta measures 2.5 cm in diameter. 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 There is no periaortic leak on this unenhanced scan Reproductive: The uterus is somewhat atrophic minimal retroflexed no adnexal mass. Other: There is no evidence of mesenteric or retroperitoneal hematoma. No mesenteric adenopathy. Small nonspecific bilateral inguinal lymph nodes are noted. The urinary bladder is under distended limiting its assessment. Musculoskeletal: There is significant levoscoliosis of upper lumbar spine. Dextroscoliosis lower thoracic spine. There are degenerative changes lumbar spine. Disc space flattening with vacuum disc phenomenon at L2-L3 level. Disc  space flattening with vacuum disc phenomenon at L5-S1 level. Facet degenerative changes noted L4 and L5 level. IMPRESSION: 1. Markedly limited study without IV contrast. No evidence of mesenteric or retroperitoneal hematoma. 2. There is large left diaphragmatic hernia with at least 2/3 of proximal stomach herniated in retrocardiac position. No evidence of gastric outlet obstruction. No gastric volvulus on this unenhanced scan. 3. There is significant levoscoliosis and degenerative changes of the lumbar spine. 4. No small bowel or colonic obstruction. No pericecal inflammation. Normal appendix partially visualized. Some colonic stool noted throughout the colon. Moderate stool and gas noted within rectum. No distal colonic obstruction. 5. No adnexal mass.  Somewhat atrophic  uterus. 6. Empty urinary bladder limiting its assessment. 7. There is aneurysmal dilatation of infrarenal abdominal aorta measures up to 2.5 cm in diameter. 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 Atherosclerotic calcifications of abdominal aorta and iliac arteries. Electronically Signed   By: Lahoma Crocker M.D.   On: 09/28/2016 08:59    Scheduled Meds: . feeding supplement  1 Container Oral TID BM  . feeding supplement (ENSURE ENLIVE)  237 mL Oral BID BM  . peg 3350 powder  0.5 kit Oral Once   And  . [START ON 09/29/2016] peg 3350 powder  0.5 kit Oral Once  . sodium chloride flush  3 mL Intravenous Q12H   Continuous Infusions: . sodium chloride 75 mL/hr at 09/27/16 2129     LOS: 0 days   Emmanuella Mirante, Orpah Melter, MD [facility] Pager [phone]  If 7PM-7AM, please contact night-coverage [email or web address] Password [facility] 09/28/2016, 2:41 PM

## 2016-09-28 NOTE — Care Management Note (Signed)
Case Management Note  Patient Details  Name: Meghan Stanley MRN: [id number] Date of Birth: [birth year]  Subjective/Objective: 71 y/o f admitted w/Anemia. From home.                   Action/Plan:d/c plan home.   Expected Discharge Date:   (unknown)               Expected Discharge Plan:  Home/Self Care  In-House Referral:     Discharge planning Services  CM Consult  Post Acute Care Choice:    Choice offered to:     DME Arranged:    DME Agency:     HH Arranged:    HH Agency:     Status of Service:  In process, will continue to follow  If discussed at Long Length of Stay Meetings, dates discussed:    Additional Comments:  Dessa Phi, RN 09/28/2016, 1:22 PM

## 2016-09-28 NOTE — Care Management Obs Status (Signed)
MEDICARE OBSERVATION STATUS NOTIFICATION   Patient Details  Name: Meghan Stanley MRN: [id number] Date of Birth: [birth year]   Medicare Observation Status Notification Given:  Yes    MahabirJuliann Pulse, RN 09/28/2016, 1:21 PM

## 2016-09-28 NOTE — Progress Notes (Signed)
Initial Nutrition Assessment  INTERVENTION:   Continue Boost Breeze po TID, each supplement provides 250 kcal and 9 grams of protein Continue Ensure Enlive po BID, each supplement provides 350 kcal and 20 grams of protein when diet advanced  Provided "GERD Nutrition Therapy" handout from [employer]  RD to continue to monitor  NUTRITION DIAGNOSIS:   Inadequate oral intake related to inability to eat as evidenced by other (see comment) (clear liquid diet).  GOAL:   Patient will meet greater than or equal to 90% of their needs  MONITOR:   PO intake, Supplement acceptance, Labs, Weight trends, I & O's  REASON FOR ASSESSMENT:   Malnutrition Screening Tool    ASSESSMENT:   71 y.o. female with medical history significant of prior Anemia and Scoliosis who was sent by PCP because of an Abnormal Hb of 5.4; Patient states over the last month or so she has gotten more weak, tired, and fatigued and sometimes gets dizzy. States she has had increased acid reflux over this time as well and states that over the weekend she vomited and it was not normal vomit but darker.   Patient in room with no family at bedside. Pt reports recurrent N/V for a year now. States it would occur randomly and lately she has developed some reflux that would initiate the vomiting. Provided a handout for GERD. Pt is on clear liquids. Pt will be NPO after midnight for EGD. Pt states she has been eating well but with smaller meals. States she would eat cereal for breakfast or a biscuit and hashbrowns. For lunch she may eat a sandwich and for dinner she would pick up something quick to eat like a hamburger or some chili. Pt has not been drinking the supplements that have been ordered.   Per chart review, pt weighed 149 lb on 1/22 and then 131 lb on 1/23. Pt states she last weighed 149 lb a year ago. This weight loss is insignificant for time frame. Pt was unable to state her UBW. Nutrition-Focused  physical exam completed. Findings are no fat depletion, mild-moderate muscle depletion, and no edema.   Medications reviewed. Labs reviewed: Vitamin B-12 WNL  Diet Order:  Diet clear liquid Room service appropriate? Yes; Fluid consistency: Thin Diet NPO time specified  Skin:  Reviewed, no issues  Last BM:  1/21  Height:   Ht Readings from Last 1 Encounters:  09/27/16 5\' 3"  (1.6 m)    Weight:   Wt Readings from Last 1 Encounters:  09/28/16 131 lb 2.8 oz (59.5 kg)    Ideal Body Weight:  52.3 kg  BMI:  Body mass index is 23.24 kg/m.  Estimated Nutritional Needs:   Kcal:  1500-1700  Protein:  70-80g  Fluid:  1.5-1.7L/day  EDUCATION NEEDS:   Education needs addressed (Provided "GERD Nutrition Therapy" handout)  Clayton Bibles, MS, RD, LDN Pager: [phone] After Hours Pager: [phone]

## 2016-09-29 ENCOUNTER — Encounter (HOSPITAL_COMMUNITY)
Admission: EM | Disposition: A | Discharge status: Home / Self Care | Payer: Self-pay | Source: Home / Self Care | Attending: Internal Medicine

## 2016-09-29 ENCOUNTER — Inpatient Hospital Stay (HOSPITAL_COMMUNITY): Payer: Medicare Other | Admitting: Certified Registered Nurse Anesthetist

## 2016-09-29 ENCOUNTER — Encounter (HOSPITAL_COMMUNITY): Payer: Self-pay

## 2016-09-29 DIAGNOSIS — K222 Esophageal obstruction: Secondary | ICD-10-CM

## 2016-09-29 DIAGNOSIS — K449 Diaphragmatic hernia without obstruction or gangrene: Secondary | ICD-10-CM

## 2016-09-29 DIAGNOSIS — D124 Benign neoplasm of descending colon: Secondary | ICD-10-CM

## 2016-09-29 DIAGNOSIS — D500 Iron deficiency anemia secondary to blood loss (chronic): Secondary | ICD-10-CM

## 2016-09-29 DIAGNOSIS — D122 Benign neoplasm of ascending colon: Secondary | ICD-10-CM

## 2016-09-29 HISTORY — PX: COLONOSCOPY: SHX5424

## 2016-09-29 HISTORY — PX: ESOPHAGOGASTRODUODENOSCOPY: SHX5428

## 2016-09-29 LAB — TYPE AND SCREEN
BLOOD PRODUCT EXPIRATION DATE: 201801292359
BLOOD PRODUCT EXPIRATION DATE: 201802012359
Blood Product Expiration Date: 201802012359
ISSUE DATE / TIME: 201801222105
ISSUE DATE / TIME: 201801221758
ISSUE DATE / TIME: 201801230018
UNIT TYPE AND RH: 6200
Unit Type and Rh: 6200
Unit Type and Rh: 6200

## 2016-09-29 LAB — CBC
HCT: 36 % (ref 36.0–46.0)
Hemoglobin: 10.6 g/dL — ABNORMAL LOW (ref 12.0–15.0)
MCH: 19.5 pg — AB (ref 26.0–34.0)
MCHC: 29.4 g/dL — ABNORMAL LOW (ref 30.0–36.0)
MCV: 66.2 fL — ABNORMAL LOW (ref 78.0–100.0)
PLATELETS: 554 10*3/uL — AB (ref 150–400)
RBC: 5.44 MIL/uL — AB (ref 3.87–5.11)
RDW: 31.1 % — ABNORMAL HIGH (ref 11.5–15.5)
WBC: 9.3 10*3/uL (ref 4.0–10.5)

## 2016-09-29 LAB — HEMOGLOBIN A1C
HEMOGLOBIN A1C: 5.3 % (ref 4.8–5.6)
Mean Plasma Glucose: 105 mg/dL

## 2016-09-29 SURGERY — COLONOSCOPY
Anesthesia: Monitor Anesthesia Care

## 2016-09-29 MED ORDER — PROPOFOL 10 MG/ML IV BOLUS
INTRAVENOUS | Status: AC
  Filled 2016-09-29: qty 60

## 2016-09-29 MED ORDER — ENSURE ENLIVE PO LIQD: 237.0000 mL | Freq: Two times a day (BID) | ORAL | Status: DC

## 2016-09-29 MED ORDER — BOOST / RESOURCE BREEZE PO LIQD: 1.0000 | Freq: Three times a day (TID) | ORAL | Status: DC

## 2016-09-29 MED ORDER — SODIUM CHLORIDE 0.9 % IV SOLN
510.0000 mg | Freq: Once | INTRAVENOUS | Status: AC
  Administered 2016-09-29: 510 mg via INTRAVENOUS
  Filled 2016-09-29: qty 17

## 2016-09-29 MED ORDER — PANTOPRAZOLE SODIUM 40 MG PO TBEC: 40.0000 mg | DELAYED_RELEASE_TABLET | Freq: Every day | ORAL | 0 refills | Status: DC

## 2016-09-29 MED ORDER — LIDOCAINE 2% (20 MG/ML) 5 ML SYRINGE
INTRAMUSCULAR | Status: DC | PRN
  Administered 2016-09-29: 100 mg via INTRAVENOUS

## 2016-09-29 MED ORDER — LACTATED RINGERS IV SOLN
INTRAVENOUS | Status: DC
  Administered 2016-09-29: 1000 mL via INTRAVENOUS

## 2016-09-29 MED ORDER — TRAMADOL HCL 50 MG PO TABS: 50.0000 mg | ORAL_TABLET | Freq: Four times a day (QID) | ORAL | 0 refills | Status: DC | PRN

## 2016-09-29 MED ORDER — PROPOFOL 10 MG/ML IV BOLUS
INTRAVENOUS | Status: DC | PRN
  Administered 2016-09-29 (×6): 20 mg via INTRAVENOUS

## 2016-09-29 MED ORDER — AMLODIPINE BESYLATE 5 MG PO TABS: 5.0000 mg | ORAL_TABLET | Freq: Every day | ORAL | 0 refills | Status: DC

## 2016-09-29 MED ORDER — ONDANSETRON HCL 4 MG/2ML IJ SOLN
INTRAMUSCULAR | Status: AC
  Filled 2016-09-29: qty 2

## 2016-09-29 MED ORDER — ONDANSETRON HCL 4 MG/2ML IJ SOLN
INTRAMUSCULAR | Status: DC | PRN
  Administered 2016-09-29: 4 mg via INTRAVENOUS

## 2016-09-29 MED ORDER — PROPOFOL 500 MG/50ML IV EMUL
INTRAVENOUS | Status: DC | PRN
  Administered 2016-09-29: 85 ug/kg/min via INTRAVENOUS

## 2016-09-29 MED ORDER — DOCUSATE SODIUM 100 MG PO CAPS: 100.0000 mg | ORAL_CAPSULE | Freq: Two times a day (BID) | ORAL | 0 refills | Status: DC

## 2016-09-29 MED ORDER — LIDOCAINE 2% (20 MG/ML) 5 ML SYRINGE
INTRAMUSCULAR | Status: AC
  Filled 2016-09-29: qty 5

## 2016-09-29 MED ORDER — AMLODIPINE BESYLATE 5 MG PO TABS
5.0000 mg | ORAL_TABLET | Freq: Every day | ORAL | Status: DC
  Administered 2016-09-29: 5 mg via ORAL
  Filled 2016-09-29: qty 1

## 2016-09-29 MED ORDER — FERROUS SULFATE 325 (65 FE) MG PO TABS: 325.0000 mg | ORAL_TABLET | Freq: Two times a day (BID) | ORAL | 0 refills | Status: DC

## 2016-09-29 NOTE — Anesthesia Preprocedure Evaluation (Signed)
Anesthesia Evaluation  Patient identified by MRN, date of birth, ID band Patient awake    Reviewed: Allergy & Precautions, NPO status , Patient's Chart, lab work & pertinent test results  Airway Mallampati: II  TM Distance: >3 FB Neck ROM: Full    Dental no notable dental hx.    Pulmonary neg pulmonary ROS,    Pulmonary exam normal breath sounds clear to auscultation       Cardiovascular negative cardio ROS Normal cardiovascular exam Rhythm:Regular Rate:Normal     Neuro/Psych negative neurological ROS  negative psych ROS   GI/Hepatic Neg liver ROS, GERD  ,  Endo/Other  negative endocrine ROS  Renal/GU negative Renal ROS  negative genitourinary   Musculoskeletal negative musculoskeletal ROS (+)   Abdominal   Peds negative pediatric ROS (+)  Hematology  (+) anemia ,   Anesthesia Other Findings   Reproductive/Obstetrics negative OB ROS                             Anesthesia Physical Anesthesia Plan  ASA: II  Anesthesia Plan: MAC   Post-op Pain Management:    Induction: Intravenous  Airway Management Planned: Nasal Cannula  Additional Equipment:   Intra-op Plan:   Post-operative Plan:   Informed Consent: I have reviewed the patients History and Physical, chart, labs and discussed the procedure including the risks, benefits and alternatives for the proposed anesthesia with the patient or authorized representative who has indicated his/her understanding and acceptance.   Dental advisory given  Plan Discussed with: CRNA and Surgeon  Anesthesia Plan Comments:         Anesthesia Quick Evaluation

## 2016-09-29 NOTE — Progress Notes (Signed)
Pharmacy - IV Iron Dosing  Assessment: 64 yoF sent from PCP d/t low Hgb of 5.4 which appears to be chronic in nature. Endo/colonoscopy performed today which revealed no acute source of blood loss. Iron studies low. Pharmacy to replete with IV iron; patient going home on oral iron as well. Plan is for discharge today.     Component Value Date/Time   IRON 5 (L) 09/27/2016 1710   TIBC 542 (H) 09/27/2016 1710   FERRITIN 2 (L) 09/27/2016 1710     Plan:  Ferumoxytol 510 mg IV x 1  If needed can repeat dose in 7 days  Please note that Feraheme can interfere with MRI for up to 3 months due to its quantum magnetic properties  Reuel Boom, PharmD Pager: [phone] 09/29/2016, 10:49 AM

## 2016-09-29 NOTE — Op Note (Signed)
[facility] Patient Name: Meghan Stanley Procedure Date: 09/29/2016 MRN: [id number] Attending MD: Docia Chuck. Henrene Pastor , MD Date of Birth: 08/09/1946 CSN: [id number] Age: 71 Admit Type: Inpatient Procedure:                Colonoscopy, with cold snare polypectomy X2 Indications:              Iron deficiency anemia Providers:                Docia Chuck. Henrene Pastor, MD, Hilma Favors, RN, Elspeth Cho Tech., Technician, Adair Laundry, CRNA Referring MD:             [facility] Medicines:                Monitored Anesthesia Care Complications:            No immediate complications. Estimated blood loss:                            None. Estimated Blood Loss:     Estimated blood loss: none. Procedure:                Pre-Anesthesia Assessment:                           - Prior to the procedure, a History and Physical                            was performed, and patient medications and                            allergies were reviewed. The patient's tolerance of                            previous anesthesia was also reviewed. The risks                            and benefits of the procedure and the sedation                            options and risks were discussed with the patient.                            All questions were answered, and informed consent                            was obtained. Prior Anticoagulants: The patient has                            taken no previous anticoagulant or antiplatelet                            agents. ASA Grade Assessment: II - A patient with  mild systemic disease. After reviewing the risks                            and benefits, the patient was deemed in                            satisfactory condition to undergo the procedure.                           After obtaining informed consent, the colonoscope                            was passed under direct vision. Throughout the                  procedure, the patient's blood pressure, pulse, and                            oxygen saturations were monitored continuously. The                            EC-[id number] scope was introduced through                            the anus and advanced to the the cecum, identified                            by appendiceal orifice and ileocecal valve. The                            ileocecal valve, appendiceal orifice, and rectum                            were photographed. The quality of the bowel                            preparation was excellent. The colonoscopy was                            performed without difficulty. The patient tolerated                            the procedure well. The bowel preparation used was                            SUPREP. Scope In: 1:25:48 PM Scope Out: 1:47:43 PM Scope Withdrawal Time: 0 hours 16 minutes 44 seconds  Total Procedure Duration: 0 hours 21 minutes 55 seconds  Findings:      Two polyps were found in the descending colon and ascending colon. The       polyps were 2 to 4 mm in size. These polyps were removed with a cold       snare. Resection and retrieval were complete.      Many diverticula were found in the left colon.      Internal hemorrhoids were found during retroflexion.  The exam was otherwise without abnormality on direct and retroflexion       views. Impression:               - Two 2 to 4 mm polyps in the descending colon and                            in the ascending colon, removed with a cold snare.                            Resected and retrieved.                           - Diverticulosis in the left colon.                           - Internal hemorrhoids.                           - The examination was otherwise normal on direct                            and retroflexion views. Moderate Sedation:      none Recommendation:           - Repeat colonoscopy in 5-10 years for surveillance.                            - EGD today. Please see results.                           - Resume previous diet.                           - Continue present medications.                           - Await pathology results. Procedure Code(s):        --- Professional ---                           [phone], Colonoscopy, flexible; with removal of                            tumor(s), polyp(s), or other lesion(s) by snare                            technique Diagnosis Code(s):        --- Professional ---                           D12.4, Benign neoplasm of descending colon                           D12.2, Benign neoplasm of ascending colon                           K64.8, Other hemorrhoids  D50.9, Iron deficiency anemia, unspecified                           K57.30, Diverticulosis of large intestine without                            perforation or abscess without bleeding CPT copyright 2016 [employer]. All rights reserved. The codes documented in this report are preliminary and upon coder review may  be revised to meet current compliance requirements. Docia Chuck. Henrene Pastor, MD 09/29/2016 1:51:34 PM This report has been signed electronically. Number of Addenda: 0

## 2016-09-29 NOTE — Discharge Summary (Addendum)
Physician Discharge Summary  MURANDA KLAES [id number] DOB: 03/30/1946  PCP: Haywood Pao, MD  Admit date: 09/27/2016 Discharge date: 09/29/2016  Recommendations for Outpatient Follow-up:  1. Dr. Domenick Gong, PCP in 5 days with repeat labs (CBC & BMP). 2. Dr. [name],  GI in 4 weeks. MDs office will arrange follow-up. Please follow pathology results of biopsies that were sent from the hospital.  Home Health: None Equipment/Devices: None    Discharge Condition: Improved and stable  CODE STATUS: Full  Diet recommendation: Heart healthy diet.  Discharge Diagnoses:  Active Problems:   Symptomatic anemia   Thrombocytosis (HCC)   Hyperglycemia   Scoliosis   Iron deficiency anemia due to chronic blood loss   Esophageal stricture   Hiatal hernia   Benign neoplasm of ascending colon   Benign neoplasm of descending colon   Brief/Interim Summary: 71 year old female with PMH of anemia, scoliosis, chronic right low back pain, sent by PCP because of abnormal hemoglobin of 5.4 g per DL. Patient give history of 1 month of progressively feeling weaker, tired, fatigued and dizzy at times but no syncope. She also reported increased acid reflux over this time and over the weekend prior to admission she vomited but no clear history suggestive of coffee-ground emesis. She had never had EGD or colonoscopy prior to this admission. 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 She has been on intermittent Mobitz since July for pain issues. She was admitted for further evaluation and management.  Assessment and plan:  1. Symptomatic iron deficiency anemia: Hemoglobin on admission was 5.4. FOBT 1 negative. No clear history of hematemesis, melena or blood in stools or vaginal bleeding. Anemia panel confirmed INR of 5 and ferritin of 2. CT abdomen and  pelvis without contrast: Limited study. No mesenteric or retroperitoneal hematoma. Large left diaphragmatic hernia. No other acute findings. GI was consulted and due to severe iron deficiency anemia without overt GI bleeding and negative occult blood test, concern was for erosions/PUD from chronic use of NSAIDs versus chronic bleeding related to large hiatal hernia versus other etiologies. She underwent EGD and colonoscopy with detailed report as below. As per GI, her large hiatal hernia is the likely cause of recurrent episodes of vomiting and also likely cause of iron deficiency from [facility] erosions, though none seen at this particular time. GI recommends Protonix 40 mg daily and ferrous sulfate 325 MG twice a day at discharge and have cleared her for discharge home with instructions that she should not drive today (this has been conveyed to patient) and follow biopsies as outpatient. Patient was transfused 3 units of PRBCs with appropriate improvement in hemoglobin which is to be closely followed as outpatient. She also received a dose of IV iron today. 2. Chronic right lower back pain/? Right lower quadrant pain: CT abdomen and pelvis without contrast was a limited study.? Musculoskeletal pain. Recommend outpatient follow-up with PCP and may consider MRI of lumbar spine and repeating CT abdomen with contrast if felt necessary, for further evaluation and/or orthopedic/spine surgery consultation. Mobic discontinued due to concern for slow GI bleeding as cause of her iron deficiency anemia. As discussed with patient, started Tramadol when necessary for pain. 3. Large hiatal hernia: Confirmed on EGD and as per GI likely cause of her recurrent episodes of vomiting and iron deficiency anemia. PPI at discharge and  outpatient surgical consultation. 4. 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 5. 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 6. Essential hypertension: Patient has had persistently elevated blood pressure throughout the hospitalization. Starting amlodipine 5 MG daily. Follow-up as outpatient. 7. Thrombocytosis: Likely related to iron deficiency anemia. Management as above. Follow CBCs as outpatient.   Discharge Instructions  Discharge Instructions    Call MD for:  difficulty breathing, headache or visual disturbances    Complete by:  As directed    Call MD for:  extreme fatigue    Complete by:  As directed    Call MD for:  persistant dizziness or light-headedness    Complete by:  As directed    Call MD for:  severe uncontrolled pain    Complete by:  As directed    Diet - low sodium heart healthy    Complete by:  As directed    Discharge instructions    Complete by:  As directed    the patient will need a driver as she has been sedated for her procedures, earlier today.   Increase activity slowly    Complete by:  As directed        Medication List    STOP taking these medications   meloxicam 7.5 MG tablet Commonly known as:  MOBIC     TAKE these medications   acetaminophen 500 MG tablet Commonly known as:  TYLENOL Take 1,000 mg by mouth every 6 (six) hours as needed.   amLODipine 5 MG tablet Commonly known as:  NORVASC Take 1 tablet (5 mg total) by mouth daily.   docusate sodium 100 MG capsule Commonly known as:  COLACE Take 1 capsule (100 mg total) by mouth 2 (two) times daily.   feeding supplement Liqd Take 1 Container by mouth 3 (three) times daily between meals.   feeding supplement (ENSURE ENLIVE) Liqd Take 237 mLs by mouth 2 (two) times daily between meals. Start taking on:  09/30/2016   ferrous sulfate 325 (65 FE) MG tablet Take 1 tablet (325 mg total) by mouth 2 (two) times daily with a meal.   pantoprazole 40 MG tablet Commonly known as:  PROTONIX Take 1  tablet (40 mg total) by mouth daily.   traMADol 50 MG tablet Commonly known as:  ULTRAM Take 1 tablet (50 mg total) by mouth every 6 (six) hours as needed for moderate pain or severe pain.      Follow-up Information    Haywood Pao, MD. Schedule an appointment as soon as possible for a visit in 5 day(s).   Specialty:  Internal Medicine Why:  To be seen with repeat labs (CBC & BMP). Consider outpatient surgical consultation for management of symptomatic hiatal hernia. Contact information: [place] [address] [phone]        [name], MD. Schedule an appointment as soon as possible for a visit in 4 week(s).   Specialty:  Gastroenterology Why:  MDs office will call with appointment. Contact information: [address] [address] [phone]          No Known Allergies  Consultations:  [facility]   Procedures/Studies: Ct Abdomen Pelvis Wo Contrast  Result Date: 09/28/2016 CLINICAL DATA:  Anemia, possible retroperitoneal hematoma, scoliosis EXAM: CT ABDOMEN AND PELVIS WITHOUT CONTRAST TECHNIQUE: Multidetector  CT imaging of the abdomen and pelvis was performed following the standard protocol without IV contrast. COMPARISON:  None. FINDINGS: Lower chest: Lung bases shows no acute findings. There is large left retrocardiac diaphragmatic hernia with herniation at least 2/3 of proximal stomach in retrocardiac position Hepatobiliary: Markedly limited study without IV and oral contrast. Unenhanced liver shows no biliary ductal dilatation. No calcified gallstones are noted within gallbladder. Pancreas: Unenhanced pancreas with normal appearance. Spleen: Unenhanced spleen is normal. Adrenals/Urinary Tract: No adrenal gland mass. Unenhanced kidneys are symmetrical in size. Limited study without IV contrast. No nephrolithiasis. No hydronephrosis or hydroureter. No calcified ureteral calculi. Stomach/Bowel: There is no small bowel obstruction.  Limited study without oral contrast. No thickened or dilated small bowel loops. There is no pericecal inflammation. Normal appendix partially visualized in coronal image 50. Some colonic stool noted in right colon transverse colon and descending colon. Some colonic stool noted within redundant sigmoid colon. Moderate stool and gas noted within rectum. No distal colonic obstruction. Vascular/Lymphatic: Atherosclerotic calcifications of abdominal aorta and iliac arteries. There is aneurysmal dilatation of infrarenal abdominal aorta measures 2.5 cm in diameter. 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 There is no periaortic leak on this unenhanced scan Reproductive: The uterus is somewhat atrophic minimal retroflexed no adnexal mass. Other: There is no evidence of mesenteric or retroperitoneal hematoma. No mesenteric adenopathy. Small nonspecific bilateral inguinal lymph nodes are noted. The urinary bladder is under distended limiting its assessment. Musculoskeletal: There is significant levoscoliosis of upper lumbar spine. Dextroscoliosis lower thoracic spine. There are degenerative changes lumbar spine. Disc space flattening with vacuum disc phenomenon at L2-L3 level. Disc space flattening with vacuum disc phenomenon at L5-S1 level. Facet degenerative changes noted L4 and L5 level. IMPRESSION: 1. Markedly limited study without IV contrast. No evidence of mesenteric or retroperitoneal hematoma. 2. There is large left diaphragmatic hernia with at least 2/3 of proximal stomach herniated in retrocardiac position. No evidence of gastric outlet obstruction. No gastric volvulus on this unenhanced scan. 3. There is significant levoscoliosis and degenerative changes of the lumbar spine. 4. No small bowel or colonic obstruction. No pericecal inflammation. Normal appendix partially visualized. Some colonic stool noted throughout the colon. Moderate stool and gas noted within rectum. No  distal colonic obstruction. 5. No adnexal mass.  Somewhat atrophic uterus. 6. Empty urinary bladder limiting its assessment. 7. There is aneurysmal dilatation of infrarenal abdominal aorta measures up to 2.5 cm in diameter. 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 Atherosclerotic calcifications of abdominal aorta and iliac arteries. Electronically Signed   By: Lahoma Crocker M.D.   On: 09/28/2016 08:59  EGD 09/29/16: Impression:               1. Esophageal wet and distal esophageal stricture                           2. Large hiatal hernia. Likely cause of recurrent                            episodes of vomiting. Also likely cause of iron                            deficiency from [facility] erosions, though none seen  at this particular time                           3. Incidental small duodenal submucosal nodule                           4. Status post duodenal biopsies to rule out sprue                            in a patient with iron deficiency anemia. Moderate Sedation:      none Recommendation:           1. The patient should be discharged home on                            pantoprazole 40 mg daily                           2. The patient should be discharged home on iron                            sulfate 325 mg twice daily                           3. The patient should follow-up in the GI clinic                            with Dr. Henrene Pastor in 4 weeks. My office will arrange                           4. Patient will likely need internal surgical                            consultation regarding symptomatic hiatal hernia.                            This can be arranged as outpatient                           5. Resume previous diet. Okay for discharge home                            from GI perspective. If this is today, the patient                            will need a driver as she has been sedated for her                             procedures                           The patient has copies of her procedure reports                            which have  been reviewed with her   Colonoscopy 09/29/16: Impression:               - Two 2 to 4 mm polyps in the descending colon and                            in the ascending colon, removed with a cold snare.                            Resected and retrieved.                           - Diverticulosis in the left colon.                           - Internal hemorrhoids.                           - The examination was otherwise normal on direct                            and retroflexion views. Moderate Sedation:      none Recommendation:           - Repeat colonoscopy in 5-10 years for surveillance.                           - EGD today. Please see results.                           - Resume previous diet.                           - Continue present medications.                           - Await pathology results.   Subjective: Patient denied complaints. Ambulating to the bathroom without dizziness, lightheadedness, dyspnea, chest pain or palpitations. Feels like she has more energy. Reports chronic right lower back pain going on for at least a year. States that this is being addressed by her PCP.  Discharge Exam:  Vitals:   09/29/16 1412 09/29/16 1420 09/29/16 1430 09/29/16 1446  BP: (!) 174/62 (!) 174/106 (!) 177/81 (!) 172/80  Pulse: 83 91 89 84  Resp: (!) 23 17 19 18   Temp: 97.8 F (36.6 C)   97.4 F (36.3 C)  TempSrc: Oral   Oral  SpO2: 100% 100% 100% 100%  Weight:      Height:        General: Pt lying comfortably in bed & appears in no obvious distress. Cardiovascular: S1 & S2 heard, RRR, S1/S2 +. No murmurs, rubs, gallops or clicks. No JVD or pedal edema.Telemetry: Sinus rhythm. Respiratory: Clear to auscultation without wheezing, rhonchi or crackles. No increased work of breathing. Abdominal:  Non distended, non tender & soft. No organomegaly or  masses appreciated. Normal bowel sounds heard. CNS: Alert and oriented. No focal deficits. Extremities: no edema, no cyanosis    The results of significant diagnostics from this hospitalization (including imaging, microbiology, ancillary and laboratory) are listed below for reference.  Microbiology: No results found for this or any previous visit (from the past 240 hour(s)).   Labs:  Basic Metabolic Panel:  Recent Labs Lab 09/27/16 1328 09/28/16 0608  NA 139 138  K 3.7 3.9  CL 107 106  CO2 25 26  GLUCOSE 115* 85  BUN 12 8  CREATININE 0.74 0.61  CALCIUM 9.2 9.2  MG  --  1.8   Liver Function Tests:  Recent Labs Lab 09/27/16 1328 09/28/16 0608  AST 18 17  ALT 11* 11*  ALKPHOS 64 62  BILITOT 0.7 3.8*  PROT 7.4 6.5  ALBUMIN 3.7 3.5   CBC:  Recent Labs Lab 09/27/16 1328 09/28/16 0608 09/29/16 0618  WBC 9.7 8.3 9.3  HGB 5.4* 8.8* 10.6*  HCT 21.0* 29.8* 36.0  MCV 59.3* 65.4* 66.2*  PLT 702* 570* 554*   Hgb A1c  Recent Labs  09/28/16 0608  HGBA1C 5.3   Thyroid function studies  Recent Labs  09/28/16 0608  TSH 3.097   Anemia work up  Recent Labs  09/27/16 1710  VITAMINB12 374  FOLATE 25.6  FERRITIN 2*  TIBC 542*  IRON 5*  RETICCTPCT 1.0      Time coordinating discharge: Over 30 minutes  SIGNED:  Vernell Leep, MD, FACP, FHM. [facility] Pager [phone]  If 7PM-7AM, please contact night-coverage [email or web address] Password [facility] 09/29/2016, 5:40 PM

## 2016-09-29 NOTE — H&P (View-Only) (Signed)
Referring Provider: [facility]   Primary Care Physician:  Haywood Pao, MD Primary Gastroenterologist:  Althia Forts  Reason for Consultation:  anemia  ASSESSMENT AND PLAN:   15. 71 yo female with severe iron deficiency anemia without overt GI bleeding and negative occult blood test. Erosions / PUD from NSAIDS ? Chronic bleeding related to large hiatal hernia?  Other considerations include AVMs, GI neoplasm -patient will be scheduled for EGD / colonoscopy to be done in am. Risk and benefits of procedures discussed and she agrees to proceed.   3. Chronic RLQ pain. Started as right low back. No acute findings on CTscan but severely limited study. Musculoskeletal pain?   4. GERD, developed over last several months and worse at night. Also intermittent nausea / vomiting for a year. She has a LARGE hiatal hernia with much of stomach in her chest on CTscan.  -PPI -Anti-reflux measures -? Outpatient surgical evaluation  HPI: Meghan Stanley is a 71 y.o. female sent to ED by PCP yesterday after routine labs turned up a hgb of 5.4. MCV 64. Ferritin of 2. She received 3 units of blood and hgb up to 8.8. No overt bleeding, FOBT negative. She takes an occasional mobic. Over the last year she has had less energy, felt 'winded" at times.   Patient admits to intermittent nausea and vomiting, mostly postprandial for a year or more. She is unsure about any weight loss but has noticed clothes big on her. No hematemesis. BMs are at baseline. For a year she has had intermittent RLQ pain. This started as low back pain but now radiates around to RLQ and even into right groin. Pain not related to bowel movement. No urinary symptoms.     She admits to dizziness, weakness Past Medical History:  Diagnosis Date  . Anemia   . Scoliosis     Past Surgical History:  Procedure Laterality Date  . NO PAST SURGERIES      Prior to Admission medications   Medication Sig Start Date End Date Taking?  Authorizing Provider  acetaminophen (TYLENOL) 500 MG tablet Take 1,000 mg by mouth every 6 (six) hours as needed.   Yes Historical Provider, MD  meloxicam (MOBIC) 7.5 MG tablet Take 7.5 mg by mouth 2 (two) times daily.   Yes Historical Provider, MD    Current Facility-Administered Medications  Medication Dose Route Frequency Provider Last Rate Last Dose  . 0.9 %  sodium chloride infusion   Intravenous Continuous Kerney Elbe, DO 75 mL/hr at 09/27/16 2129    . acetaminophen (TYLENOL) tablet 650 mg  650 mg Oral Q6H PRN Kerney Elbe, DO       Or  . acetaminophen (TYLENOL) suppository 650 mg  650 mg Rectal Q6H PRN [place], DO      . feeding supplement (BOOST / RESOURCE BREEZE) liquid 1 Container  1 Container Oral TID BM Kerney Elbe, DO   1 Container at 09/27/16 2140  . feeding supplement (ENSURE ENLIVE) (ENSURE ENLIVE) liquid 237 mL  237 mL Oral BID BM Omair Latif Sheikh, DO      . ondansetron [facility]) tablet 4 mg  4 mg Oral Q6H PRN Kerney Elbe, DO       Or  . ondansetron [facility]) injection 4 mg  4 mg Intravenous Q6H PRN Omair Latif Sheikh, DO      . sodium chloride flush (NS) 0.9 % injection 3 mL  3 mL Intravenous Q12H Kerney Elbe, DO  3 mL at 09/27/16 2200  . traMADol (ULTRAM) tablet 50 mg  50 mg Oral Q6H PRN Bertram Savin Sheikh, DO   50 mg at 09/28/16 0014    Allergies as of 09/27/2016  . (No Known Allergies)   No [place] of GI malignancies.  History reviewed. No pertinent family history.  Social History   Social History  . Marital status: Unknown    Spouse name: N/A  . Number of children: N/A  . Years of education: N/A   Occupational History  . Not on file.   Social History Main Topics  . Smoking status: Never Smoker  . Smokeless tobacco: Never Used  . Alcohol use No  . Drug use: No  . Sexual activity: Not on file   Other Topics Concern  . Not on file   Social History Narrative  . No narrative on file    Review of  Systems: All systems reviewed and negative except where noted in HPI.  Physical Exam: Vital signs in last 24 hours: Temp:  [97.9 F (36.6 C)-98.9 F (37.2 C)] 98.1 F (36.7 C) (01/23 0512) Pulse Rate:  [77-112] 77 (01/23 0512) Resp:  [12-18] 18 (01/23 0512) BP: (123-168)/(70-87) 145/83 (01/23 0512) SpO2:  [98 %-100 %] 100 % (01/23 0512) Weight:  [131 lb 2.8 oz (59.5 kg)-149 lb (67.6 kg)] 131 lb 2.8 oz (59.5 kg) (01/23 0512) Last BM Date: 09/26/16 General:   Alert,  Thin black female in NAD Head:  Normocephalic and atraumatic. Eyes:  Sclera clear, no icterus.   Conjunctiva pink. Ears:  Normal auditory acuity. Nose:  No deformity, discharge,  or lesions. Mouth:  No deformity or lesions.   Neck:  Supple; no masses Lungs:  Clear throughout to auscultation.   No wheezes, crackles, or rhonchi.  Heart:  Regular rate and rhythm; no murmurs, clicks, rubs,  or gallops. Abdomen:  Soft,nontender, BS active,nonpalp mass or hsm.   Rectal:  Deferred  Msk:  Symmetrical without gross deformities. . Pulses:  Normal pulses noted. Extremities:  Without clubbing or edema. Neurologic:  Alert and  oriented x4;  grossly normal neurologically. Skin:  Intact without significant lesions or rashes.. Psych:  Alert and cooperative. Normal mood and affect.  Intake/Output from previous day: 01/22 0701 - 01/23 0700 In: 1005 [Blood:1005] Out: -  Intake/Output this shift: Total I/O In: 360 [P.O.:360] Out: -   Lab Results:  Recent Labs  09/27/16 1328 09/28/16 0608  WBC 9.7 8.3  HGB 5.4* 8.8*  HCT 21.0* 29.8*  PLT 702* 570*   BMET  Recent Labs  09/27/16 1328 09/28/16 0608  NA 139 138  K 3.7 3.9  CL 107 106  CO2 25 26  GLUCOSE 115* 85  BUN 12 8  CREATININE 0.74 0.61  CALCIUM 9.2 9.2   LFT  Recent Labs  09/28/16 0608  PROT 6.5  ALBUMIN 3.5  AST 17  ALT 11*  ALKPHOS 62  BILITOT 3.8*   PT/INR  Recent Labs  09/28/16 0608  LABPROT 13.8  INR 1.06    Studies/Results: Ct  Abdomen Pelvis Wo Contrast  Result Date: 09/28/2016 CLINICAL DATA:  Anemia, possible retroperitoneal hematoma, scoliosis EXAM: CT ABDOMEN AND PELVIS WITHOUT CONTRAST TECHNIQUE: Multidetector CT imaging of the abdomen and pelvis was performed following the standard protocol without IV contrast. COMPARISON:  None. FINDINGS: Lower chest: Lung bases shows no acute findings. There is large left retrocardiac diaphragmatic hernia with herniation at least 2/3 of proximal stomach in retrocardiac position Hepatobiliary: Markedly limited study without  IV and oral contrast. Unenhanced liver shows no biliary ductal dilatation. No calcified gallstones are noted within gallbladder. Pancreas: Unenhanced pancreas with normal appearance. Spleen: Unenhanced spleen is normal. Adrenals/Urinary Tract: No adrenal gland mass. Unenhanced kidneys are symmetrical in size. Limited study without IV contrast. No nephrolithiasis. No hydronephrosis or hydroureter. No calcified ureteral calculi. Stomach/Bowel: There is no small bowel obstruction. Limited study without oral contrast. No thickened or dilated small bowel loops. There is no pericecal inflammation. Normal appendix partially visualized in coronal image 50. Some colonic stool noted in right colon transverse colon and descending colon. Some colonic stool noted within redundant sigmoid colon. Moderate stool and gas noted within rectum. No distal colonic obstruction. Vascular/Lymphatic: Atherosclerotic calcifications of abdominal aorta and iliac arteries. There is aneurysmal dilatation of infrarenal abdominal aorta measures 2.5 cm in diameter. 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 There is no periaortic leak on this unenhanced scan Reproductive: The uterus is somewhat atrophic minimal retroflexed no adnexal mass. Other: There is no evidence of mesenteric or retroperitoneal hematoma. No mesenteric adenopathy. Small nonspecific bilateral inguinal  lymph nodes are noted. The urinary bladder is under distended limiting its assessment. Musculoskeletal: There is significant levoscoliosis of upper lumbar spine. Dextroscoliosis lower thoracic spine. There are degenerative changes lumbar spine. Disc space flattening with vacuum disc phenomenon at L2-L3 level. Disc space flattening with vacuum disc phenomenon at L5-S1 level. Facet degenerative changes noted L4 and L5 level. IMPRESSION: 1. Markedly limited study without IV contrast. No evidence of mesenteric or retroperitoneal hematoma. 2. There is large left diaphragmatic hernia with at least 2/3 of proximal stomach herniated in retrocardiac position. No evidence of gastric outlet obstruction. No gastric volvulus on this unenhanced scan. 3. There is significant levoscoliosis and degenerative changes of the lumbar spine. 4. No small bowel or colonic obstruction. No pericecal inflammation. Normal appendix partially visualized. Some colonic stool noted throughout the colon. Moderate stool and gas noted within rectum. No distal colonic obstruction. 5. No adnexal mass.  Somewhat atrophic uterus. 6. Empty urinary bladder limiting its assessment. 7. There is aneurysmal dilatation of infrarenal abdominal aorta measures up to 2.5 cm in diameter. 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 Atherosclerotic calcifications of abdominal aorta and iliac arteries. Electronically Signed   By: Lahoma Crocker M.D.   On: 09/28/2016 08:59   Tye Savoy, NP-C @  09/28/2016, 9:27 AM  Pager number [phone]  GI ATTENDING  History, laboratories, x-rays reviewed. Patient personally seen and examined. Agree with comprehensive consultation note as outlined above. Patient presents with severe iron deficiency anemia. Also chronic problems with recurrent vomiting. Large hiatal hernia as noted. Suspect hiatal hernia is responsible for recurrent problems with vomiting. As well, may have Cameron erosions  on the hernia sac to explain anemia. The patient is for colonoscopy and upper endoscopy tomorrow to evaluate iron deficiency anemia and GI symptoms. The nature of the procedure, as well as the risks, benefits, and alternatives were carefully and thoroughly reviewed with the patient. Ample time for discussion and questions allowed. The patient understood, was satisfied, and agreed to proceed.Hemoglobin improved post transfusion. 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  Docia Chuck. Geri [place]inole., M.D. [facility] Division of Gastroenterology

## 2016-09-29 NOTE — Op Note (Signed)
[facility] Patient Name: Meghan Stanley Procedure Date: 09/29/2016 MRN: [id number] Attending MD: Docia Chuck. Henrene Pastor , MD Date of Birth: 1945-09-14 CSN: [id number] Age: 71 Admit Type: Inpatient Procedure:                Upper GI endoscopy, with biopsies Indications:              Iron deficiency anemia, Nausea with vomiting.                            Recurrent. Large hiatal hernia on imaging Providers:                Docia Chuck. Henrene Pastor, MD, Hilma Favors, RN, Elspeth Cho Tech., Technician, Adair Laundry, CRNA Referring MD:             Haywood Pao, MD Medicines:                Monitored Anesthesia Care Complications:            No immediate complications. Estimated Blood Loss:     Estimated blood loss: none. Procedure:                Pre-Anesthesia Assessment:                           - Prior to the procedure, a History and Physical                            was performed, and patient medications and                            allergies were reviewed. The patient's tolerance of                            previous anesthesia was also reviewed. The risks                            and benefits of the procedure and the sedation                            options and risks were discussed with the patient.                            All questions were answered, and informed consent                            was obtained. Prior Anticoagulants: The patient has                            taken no previous anticoagulant or antiplatelet                            agents. ASA Grade Assessment: II - A patient with  mild systemic disease. After reviewing the risks                            and benefits, the patient was deemed in                            satisfactory condition to undergo the procedure.                           After obtaining informed consent, the endoscope was                            passed under direct  vision. Throughout the                            procedure, the patient's blood pressure, pulse, and                            oxygen saturations were monitored continuously. The                            [id number]) scope was introduced through the                            mouth, and advanced to the second part of duodenum.                            The upper GI endoscopy was accomplished without                            difficulty. The patient tolerated the procedure                            well. 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 This       measured 1.3 cm (inner diameter) and was traversed. Slight web       disruption with passage of the endoscope.      One mild benign-appearing, intrinsic stenosis was found 32 cm from the       incisors. This measured 1.5 cm (inner diameter) and was traversed.      The exam of the esophagus was otherwise normal.      A large hiatal hernia was present. This measured approximate 7 or 8 cm.       No Cameron erosions at this time.      The exam of the stomach was otherwise normal.      The examined duodenum was normal, with the exception of a 10 mm       submucosal nodule in the bulb. Biopsies for histology were taken from       the post bulbar duodenum with a cold forceps for evaluation of celiac       disease. 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 Large hiatal hernia. Likely cause of recurrent                            episodes of vomiting. Also likely cause of iron                            deficiency from [facility] erosions, though none seen                            at this particular time                           3. Incidental small duodenal submucosal nodule                           4. Status post duodenal biopsies to rule out sprue                            in a patient with iron deficiency  anemia. Moderate Sedation:      none Recommendation:           1. The patient should be discharged home on                            pantoprazole 40 mg daily                           2. The patient should be discharged home on iron                            sulfate 325 mg twice daily                           3. The patient should follow-up in the GI clinic                            with Dr. Henrene Pastor in 4 weeks. My office will arrange                           4. Patient will likely need internal surgical                            consultation regarding symptomatic hiatal hernia.                            This can be arranged as outpatient                           5. Resume previous diet. Okay for discharge home                            from GI perspective. If this is today, the patient  will need a driver as she has been sedated for her                            procedures                           The patient has copies of her procedure reports                            which have been reviewed with her .GI available for                            questions or problems. Will sign off... Procedure Code(s):        --- Professional ---                           [id number], Esophagogastroduodenoscopy, flexible,                            transoral; with biopsy, single or multiple Diagnosis Code(s):        --- Professional ---                           K22.2, Esophageal obstruction                           K44.9, Diaphragmatic hernia without obstruction or                            gangrene                           D50.9, Iron deficiency anemia, unspecified                           R11.2, Nausea with vomiting, unspecified CPT copyright 2016 [employer]. All rights reserved. The codes documented in this report are preliminary and upon coder review may  be revised to meet current compliance requirements. Docia Chuck. Henrene Pastor, MD 09/29/2016 2:14:19  PM This report has been signed electronically. Number of Addenda: 0

## 2016-09-29 NOTE — Interval H&P Note (Signed)
History and Physical Interval Note:  09/29/2016 1:05 PM  Meghan Stanley  has presented today for surgery, with the diagnosis of iron deficiency anemia  The various methods of treatment have been discussed with the patient and family. After consideration of risks, benefits and other options for treatment, the patient has consented to  Procedure(s): COLONOSCOPY (N/A) ESOPHAGOGASTRODUODENOSCOPY (EGD) (N/A) as a surgical intervention .  The patient's history has been reviewed, patient examined, no change in status, stable for surgery.  I have reviewed the patient's chart and labs.  Questions were answered to the patient's satisfaction.     [name]

## 2016-09-29 NOTE — Discharge Instructions (Addendum)
Iron Deficiency Anemia, Adult Iron deficiency anemia is a condition in which the concentration of red blood cells or hemoglobin in the blood is below normal because of too little iron. Hemoglobin is a substance in red blood cells that carries oxygen to the body's tissues. When the concentration of red blood cells or hemoglobin is too low, not enough oxygen reaches these tissues. Iron deficiency anemia is usually long-lasting (chronic) and it develops over time. It may or may not cause symptoms. It is a common type of anemia. What are the causes? This condition may be caused by:  Not enough iron in the diet.  Blood loss caused by bleeding in the intestine.  Blood loss from a gastrointestinal condition like Crohn disease.  Frequent blood draws, such as from blood donation.  Abnormal absorption in the gut.  Heavy menstrual periods in women.  Cancers of the gastrointestinal system, such as colon cancer. What are the signs or symptoms? Symptoms of this condition may include:  Fatigue.  Headache.  Pale skin, lips, and nail beds.  Poor appetite.  Weakness.  Shortness of breath.  Dizziness.  Cold hands and feet.  Fast or irregular heartbeat.  Irritability. This is more common in severe anemia.  Rapid breathing. This is more common in severe anemia. Mild anemia may not cause any symptoms. How is this diagnosed? This condition is diagnosed based on:  Your medical history.  A physical exam.  Blood tests. You may have additional tests to find the underlying cause of your anemia, such as:  Testing for blood in the stool (fecal occult blood test).  A procedure to see inside your colon and rectum (colonoscopy).  A procedure to see inside your esophagus and stomach (endoscopy).  A test in which cells are removed from bone marrow (bone marrow aspiration) or fluid is removed from the bone marrow to be examined (biopsy). This is rarely needed. How is this treated? This  condition is treated by correcting the cause of your iron deficiency. Treatment may involve:  Adding iron-rich foods to your diet.  Taking iron supplements. If you are pregnant or breastfeeding, you may need to take extra iron because your normal diet usually does not provide the amount of iron that you need.  Increasing vitamin C intake. Vitamin C helps your body absorb iron. Your health care provider may recommend that you take iron supplements along with a glass of orange juice or a vitamin C supplement.  Medicines to make heavy menstrual flow lighter.  Surgery. You may need repeat blood tests to determine whether treatment is working. Depending on the underlying cause, the anemia should be corrected within 2 months of starting treatment. If the treatment does not seem to be working, you may need more testing. Follow these instructions at home: Medicines  Take over-the-counter and prescription medicines only as told by your health care provider. This includes iron supplements and vitamins.  If you cannot tolerate taking iron supplements by mouth, talk with your health care provider about taking them through a vein (intravenously) or an injection into a muscle.  For the best iron absorption, you should take iron supplements when your stomach is empty. If you cannot tolerate them on an empty stomach, you may need to take them with food.  Do not drink milk or take antacids at the same time as your iron supplements. Milk and antacids may interfere with iron absorption.  Iron supplements can cause constipation. To prevent constipation, include fiber in your diet as told  by your health care provider. A stool softener may also be recommended. Eating and drinking  Talk with your health care provider before changing your diet. He or she may recommend that you eat foods that contain a lot of iron, such as:  Liver.  Low-fat (lean) beef.  Breads and cereals that have iron added to them (are  fortified).  Eggs.  Dried fruit.  Dark green, leafy vegetables.  To help your body use the iron from iron-rich foods, eat those foods at the same time as fresh fruits and vegetables that are high in vitamin C. Foods that are high in vitamin C include:  Oranges.  Peppers.  Tomatoes.  Mangoes.  Drinkenoughfluid to keep your urine clear or pale yellow. General instructions  Return to your normal activities as told by your health care provider. Ask your health care provider what activities are safe for you.  Practice good hygiene. Anemia can make you more prone to illness and infection.  Keep all follow-up visits as told by your health care provider. This is important. Contact a health care provider if:  You feel nauseous or you vomit.  You feel weak.  You have unexplained sweating.  You develop symptoms of constipation, such as:  Having fewer than three bowel movements a week.  Straining to have a bowel movement.  Having stools that are hard, dry, or larger than normal.  Feeling full or bloated.  Pain in the lower abdomen.  Not feeling relief after having a bowel movement. Get help right away if:  You faint. If this happens, do not drive yourself to the hospital. Call your local emergency services (911 in the [place]  You have chest pain.  You have shortness of breath that:  Is severe.  Gets worse with physical activity.  You have a rapid heartbeat.  You become light-headed when getting up from a sitting or lying down position. This information is not intended to replace advice given to you by your health care provider. Make sure you discuss any questions you have with your health care provider. Document Released: 08/20/2000 Document Revised: 05/12/2016 Document Reviewed: 05/12/2016 Elsevier Interactive Patient Education  2017 [employer].   Additional discharge instructions:  Please get your medications reviewed and adjusted by your Primary  MD.  Please request your Primary MD to go over all Hospital Tests and Procedure/Radiological results at the follow up, please get all Hospital records sent to your Prim MD by signing hospital release before you go home.  If you had Pneumonia of Lung problems at the Hospital: Please get a 2 view Chest X ray done in 6-8 weeks after hospital discharge or sooner if instructed by your Primary MD.  If you have Congestive Heart Failure: Please call your Cardiologist or Primary MD anytime you have any of the following symptoms:  1) 3 pound weight gain in 24 hours or 5 pounds in 1 week  2) shortness of breath, with or without a dry hacking cough  3) swelling in the hands, feet or stomach  4) if you have to sleep on extra pillows at night in order to breathe  Follow cardiac low salt diet and 1.5 lit/day fluid restriction.  If you have diabetes Accuchecks 4 times/day, Once in AM empty stomach and then before each meal. Log in all results and show them to your primary doctor at your next visit. If any glucose reading is under 80 or above 300 call your primary MD immediately.  If you have Seizure/Convulsions/Epilepsy:  Please do not drive, operate heavy machinery, participate in activities at heights or participate in high speed sports until you have seen by Primary MD or a Neurologist and advised to do so again.  If you had Gastrointestinal Bleeding: Please ask your Primary MD to check a complete blood count within one week of discharge or at your next visit. Your endoscopic/colonoscopic biopsies that are pending at the time of discharge, will also need to followed by your Primary MD.  Get Medicines reviewed and adjusted. Please take all your medications with you for your next visit with your Primary MD  Please request your Primary MD to go over all hospital tests and procedure/radiological results at the follow up, please ask your Primary MD to get all Hospital records sent to his/her office.  If  you experience worsening of your admission symptoms, develop shortness of breath, life threatening emergency, suicidal or homicidal thoughts you must seek medical attention immediately by calling 911 or calling your MD immediately  if symptoms less severe.  You must read complete instructions/literature along with all the possible adverse reactions/side effects for all the Medicines you take and that have been prescribed to you. Take any new Medicines after you have completely understood and accpet all the possible adverse reactions/side effects.   Do not drive or operate heavy machinery when taking Pain medications.   Do not take more than prescribed Pain, Sleep and Anxiety Medications  Special Instructions: If you have smoked or chewed Tobacco  in the last 2 yrs please stop smoking, stop any regular Alcohol  and or any Recreational drug use.  Wear Seat belts while driving.  Please note You were cared for by a hospitalist during your hospital stay. If you have any questions about your discharge medications or the care you received while you were in the hospital after you are discharged, you can call the unit and asked to speak with the hospitalist on call if the hospitalist that took care of you is not available. Once you are discharged, your primary care physician will handle any further medical issues. Please note that NO REFILLS for any discharge medications will be authorized once you are discharged, as it is imperative that you return to your primary care physician (or establish a relationship with a primary care physician if you do not have one) for your aftercare needs so that they can reassess your need for medications and monitor your lab values.  You can reach the hospitalist office at phone [phone] or fax [phone]   If you do not have a primary care physician, you can call [phone] for a physician referral.    Pain medications instructions:  How can pain medicine affect  me?  You were given a prescription for pain medicine. This medicine may make you tired or drowsy and may affect your ability to think clearly. Pain medicine may also affect your ability to drive or perform certain physical activities. It may not be possible to make all of your pain go away, but you should be comfortable enough to move, breathe, and take care of yourself. How often should I take pain medicine and how much should I take?  Take pain medicine only as directed by your health care provider and only as needed for pain.  You do not need to take pain medicine if you are not having pain, unless directed by your health care provider.  You can take less than the prescribed dose if you find that a smaller amount  of medicine controls your pain. What restrictions do I have while taking pain medicine? Follow these instructions after you start taking pain medicine, while you are taking the medicine, and for 8 hours after you stop taking the medicine:  Do not drive.  Do not operate machinery.  Do not operate power tools.  Do not sign legal documents.  Do not drink alcohol.  Do not take sleeping pills.  Do not supervise children by yourself.  Do not participate in activities that require climbing or being in high places.  Do not enter a body of water--such as a lake, river, ocean, spa, or swimming pool--without an adult nearby who can monitor and help you. How can I keep others safe while I am taking pain medicine?  Store your pain medicine as directed by your health care provider. Make sure that it is placed where children and pets cannot reach it.  Never share your pain medicine with anyone.  Do not save any leftover pills. If you have any leftover pain medicine, get rid of it or destroy it as directed by your health care provider. What else do I need to know about taking pain medicine?  Use a stool softener if you become constipated from your pain medicine. Increasing your  intake of fruits and vegetables will also help with constipation.  Write down the times when you take your pain medicine. Look at the times before you take your next dose of medicine. It is easy to become confused while on pain medicine. Recording the times helps you to avoid an overdose.  If your pain is severe, do not try to treat it yourself by taking more pills than instructed on your prescription. Contact your health care provider for help.  You may have been prescribed a pain medicine that contains acetaminophen. Do not take any other acetaminophen while taking this medicine. An overdose of acetaminophen can result in severe liver damage. Acetaminophen is found in many over-the-counter (OTC) and prescription medicines. If you are taking any medicines in addition to your pain medicine, check the active ingredients on those medicines to see if acetaminophen is listed. When should I call my health care provider?  Your medicine is not helping to make the pain go away.  You vomit or have diarrhea shortly after taking the medicine.  You develop new pain in areas that did not hurt before.  You have an allergic reaction to your medicine. This may include: ? Itchiness. ? Swelling. ? Dizziness. ? Developing a new rash. When should I call 911 or go to the emergency room?  You feel dizzy or you faint.  You are very confused or disoriented.  You repeatedly vomit.  Your skin or lips turn pale or bluish in color.  You have shortness of breath or you are breathing much more slowly than usual.  You have a severe allergic reaction to your medicine. This includes: ? Developing tongue swelling. ? Having difficulty breathing. This information is not intended to replace advice given to you by your health care provider. Make sure you discuss any questions you have with your health care provider. Document Released: 11/29/2000 Document Revised: 03/12/2016 Document Reviewed: 06/27/2014 Elsevier  Interactive Patient Education  2017 [employer].

## 2016-09-29 NOTE — Transfer of Care (Signed)
Immediate Anesthesia Transfer of Care Note  Patient: Meghan Stanley  Procedure(s) Performed: Procedure(s): COLONOSCOPY (N/A) ESOPHAGOGASTRODUODENOSCOPY (EGD) (N/A)  Patient Location: PACU  Anesthesia Type:General  Level of Consciousness:  sedated, patient cooperative and responds to stimulation  Airway & Oxygen Therapy:Patient Spontanous Breathing and Patient connected to face mask oxgen  Post-op Assessment:  Report given to PACU RN and Post -op Vital signs reviewed and stable  Post vital signs:  Reviewed and stable  Last Vitals:  Vitals:   09/29/16 0605 09/29/16 1212  BP: (!) 154/84 (!) 193/81  Pulse: 89 94  Resp:  (!) 9  Temp:  123XX123 C    Complications: No apparent anesthesia complications

## 2016-09-29 NOTE — Anesthesia Postprocedure Evaluation (Addendum)
Anesthesia Post Note  Patient: Meghan Stanley  Procedure(s) Performed: Procedure(s) (LRB): COLONOSCOPY (N/A) ESOPHAGOGASTRODUODENOSCOPY (EGD) (N/A)  Patient location during evaluation: PACU Anesthesia Type: MAC Level of consciousness: awake and alert Pain management: pain level controlled Vital Signs Assessment: post-procedure vital signs reviewed and stable Respiratory status: spontaneous breathing, nonlabored ventilation, respiratory function stable and patient connected to nasal cannula oxygen Cardiovascular status: stable and blood pressure returned to baseline Anesthetic complications: no       Last Vitals:  Vitals:   09/29/16 1420 09/29/16 1430  BP: (!) 174/106 (!) 177/81  Pulse: 91 89  Resp: 17 19  Temp:      Last Pain:  Vitals:   09/29/16 1412  TempSrc: Oral  PainSc:                  [name] S

## 2016-10-01 ENCOUNTER — Encounter (HOSPITAL_COMMUNITY): Payer: Self-pay | Admitting: Internal Medicine

## 2016-10-06 ENCOUNTER — Telehealth: Payer: Self-pay

## 2016-10-06 NOTE — Telephone Encounter (Signed)
Pt scheduled to see Dr. Henrene Pastor 11/11/16@2 :30pm. Pt aware of appt.

## 2016-10-06 NOTE — Telephone Encounter (Signed)
-----   Message from Irene Shipper, MD sent at 10/06/2016  9:36 AM EST ----- Regarding: RE: Appointment Wait until she sees me. I will see how she is feeling. Thanks ----- Message ----- From: Algernon Huxley, RN Sent: 10/05/2016   4:17 PM To: Irene Shipper, MD Subject: RE: Appointment                                Ok do you want her to be set up with [facility] now or wait until she sees you in the office?  ----- Message ----- From: Irene Shipper, MD Sent: 10/05/2016   4:02 PM To: Algernon Huxley, RN Subject: RE: Appointment                                Have her see me. Thanks ----- Message ----- From: Algernon Huxley, RN Sent: 10/05/2016   3:32 PM To: Irene Shipper, MD Subject: Appointment                                    Dr. Henrene Pastor,  Per your procedure report dated 09/29/16 on this pt you wanted her to have OV with you in 4 weeks. Pt already has scheduled appt with Tye Savoy NP for 10/19/16@10am . Do you still want her scheduled to see you? Please advise.  Thanks, [employer]

## 2016-10-08 ENCOUNTER — Other Ambulatory Visit: Payer: Self-pay | Admitting: Internal Medicine

## 2016-10-08 DIAGNOSIS — Z1231 Encounter for screening mammogram for malignant neoplasm of breast: Secondary | ICD-10-CM

## 2016-10-19 ENCOUNTER — Ambulatory Visit: Payer: Self-pay | Admitting: Nurse Practitioner

## 2016-11-01 ENCOUNTER — Ambulatory Visit
Admission: RE | Admit: 2016-11-01 | Discharge: 2016-11-01 | Disposition: A | Discharge status: Home / Self Care | Payer: Medicare Other | Source: Ambulatory Visit | Attending: Internal Medicine | Admitting: Internal Medicine

## 2016-11-01 DIAGNOSIS — Z1231 Encounter for screening mammogram for malignant neoplasm of breast: Secondary | ICD-10-CM

## 2016-11-03 ENCOUNTER — Other Ambulatory Visit: Payer: Self-pay | Admitting: Internal Medicine

## 2016-11-03 DIAGNOSIS — R928 Other abnormal and inconclusive findings on diagnostic imaging of breast: Secondary | ICD-10-CM

## 2016-11-08 ENCOUNTER — Ambulatory Visit
Admission: RE | Admit: 2016-11-08 | Discharge: 2016-11-08 | Disposition: A | Discharge status: Home / Self Care | Payer: Medicare Other | Source: Ambulatory Visit | Attending: Internal Medicine | Admitting: Internal Medicine

## 2016-11-08 DIAGNOSIS — R928 Other abnormal and inconclusive findings on diagnostic imaging of breast: Secondary | ICD-10-CM

## 2016-11-11 ENCOUNTER — Ambulatory Visit: Payer: Self-pay | Admitting: Internal Medicine

## 2016-12-15 ENCOUNTER — Encounter: Payer: Self-pay | Admitting: Internal Medicine

## 2016-12-15 ENCOUNTER — Ambulatory Visit (INDEPENDENT_AMBULATORY_CARE_PROVIDER_SITE_OTHER): Payer: Medicare Other | Admitting: Internal Medicine

## 2016-12-15 ENCOUNTER — Encounter (INDEPENDENT_AMBULATORY_CARE_PROVIDER_SITE_OTHER): Payer: Self-pay

## 2016-12-15 ENCOUNTER — Other Ambulatory Visit (INDEPENDENT_AMBULATORY_CARE_PROVIDER_SITE_OTHER): Payer: Medicare Other

## 2016-12-15 VITALS — BP 142/88 | HR 120 | Ht 63.0 in | Wt 132.0 lb

## 2016-12-15 DIAGNOSIS — D509 Iron deficiency anemia, unspecified: Secondary | ICD-10-CM

## 2016-12-15 DIAGNOSIS — K219 Gastro-esophageal reflux disease without esophagitis: Secondary | ICD-10-CM

## 2016-12-15 LAB — CBC WITH DIFFERENTIAL/PLATELET
BASOS ABS: 0 10*3/uL (ref 0.0–0.1)
Basophils Relative: 0.4 % (ref 0.0–3.0)
EOS PCT: 1.3 % (ref 0.0–5.0)
Eosinophils Absolute: 0.1 10*3/uL (ref 0.0–0.7)
HEMATOCRIT: 36.3 % (ref 36.0–46.0)
Hemoglobin: 12.1 g/dL (ref 12.0–15.0)
LYMPHS ABS: 3.4 10*3/uL (ref 0.7–4.0)
Lymphocytes Relative: 33.9 % (ref 12.0–46.0)
MCHC: 33.3 g/dL (ref 30.0–36.0)
MCV: 79.7 fl (ref 78.0–100.0)
MONO ABS: 0.6 10*3/uL (ref 0.1–1.0)
MONOS PCT: 5.8 % (ref 3.0–12.0)
NEUTROS ABS: 5.9 10*3/uL (ref 1.4–7.7)
Neutrophils Relative %: 58.6 % (ref 43.0–77.0)
Platelets: 210 10*3/uL (ref 150.0–400.0)
RBC: 4.55 Mil/uL (ref 3.87–5.11)
RDW: 27.5 % — ABNORMAL HIGH (ref 11.5–15.5)
WBC: 10.1 10*3/uL (ref 4.0–10.5)

## 2016-12-15 LAB — FERRITIN: Ferritin: 158.4 ng/mL (ref 10.0–291.0)

## 2016-12-15 NOTE — Progress Notes (Signed)
HISTORY OF PRESENT ILLNESS:  Meghan Stanley is a 71 y.o. female who I saw in consultation at the hospital January 2018 regarding profound iron deficiency anemia. Hemoglobin at that time was 5.4. MCV 59.3. She was transfused to a discharge hemoglobin of 10.6. No history of GI bleeding. She did undergo colonoscopy and upper endoscopy 09/29/2016. Colonoscopy revealed diminutive colon polyps which were resected and found to be tubular adenomas. As well, left-sided diverticulosis and internal hemorrhoids. Follow-up colonoscopy in 5 years recommended. 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 There was a large hiatal hernia without active Cameron lesions and incidental duodenal (submucosal) nodule. Duodenal biopsies were negative for sprue. Since that time she has been on PPI therapy and iron replacement therapy. She is pleased report that she has had no recurrent problems with nausea or vomiting since her hospital discharge. She has tolerated iron well though does take Colace 2 keep her bowels moving. No other issues  REVIEW OF SYSTEMS:  All non-GI ROS negative except for back pain, leg pain  Past Medical History:  Diagnosis Date  . Anemia   . Colon polyps   . GERD (gastroesophageal reflux disease)   . Hemorrhoids   . Hiatal hernia   . Scoliosis     Past Surgical History:  Procedure Laterality Date  . COLONOSCOPY N/A 09/29/2016   Procedure: COLONOSCOPY;  Surgeon: Irene Shipper, MD;  Location: [facility];  Service: Endoscopy;  Laterality: N/A;  . ESOPHAGOGASTRODUODENOSCOPY N/A 09/29/2016   Procedure: ESOPHAGOGASTRODUODENOSCOPY (EGD);  Surgeon: Irene Shipper, MD;  Location: [facility];  Service: Endoscopy;  Laterality: N/A;  . NO PAST SURGERIES      Social History Meghan Stanley  reports that she has never smoked. She has never used smokeless tobacco. She reports that she does not drink alcohol or use drugs.  family history includes Heart disease in  her father; Hypertension in her mother.  No Known Allergies     PHYSICAL EXAMINATION: Vital signs: BP (!) 142/88   Pulse (!) 120   Ht 5\' 3"  (1.6 m)   Wt 132 lb (59.9 kg)   BMI 23.38 kg/m   Constitutional: Thin but generally well-appearing, no acute distress Psychiatric: alert and oriented x3, cooperative Eyes: extraocular movements intact, anicteric, conjunctiva pink Mouth: oral pharynx moist, no lesions Neck: supple no lymphadenopathy Cardiovascular: heart regular rate and rhythm, no murmur Lungs: clear to auscultation bilaterally Abdomen: soft, nontender, nondistended, no obvious ascites, no peritoneal signs, normal bowel sounds, no organomegaly Rectal: Omitted Extremities: no clubbing cyanosis or lower extremity edema bilaterally Skin: no lesions on visible extremities Neuro: No focal deficits.   ASSESSMENT:  #1. Profound iron deficiency anemia. Presumably secondary to transient Cameron erosions associated with hiatal hernia #2. GERD with large hiatal hernia and peptic stricture. This is felt to be responsible for her intermittent problems with nausea and vomiting. Problems have resolved on PPI #3. Adenomatous colon polyps on recent colonoscopy   PLAN:  #1. Continue PPI #2. Continue on twice daily #3. Check CBC and ferritin today #4. Surveillance colonoscopy in 5 years #5. Routine office follow-up in 6 months. Contact the office in the interim for any questions or problems. Should the patient have recurrent iron deficiency anemia on iron therapy or develop recurrent abdominal symptoms I would interrogate her small bowel.   25 minutes spent face-to-face with the patient. Greater than 50% a time use for counseling regarding her iron deficiency anemia, reflux disease, peptic stricture, colon polyps,  and reviewing her endoscopic evaluations and care plan in detail

## 2016-12-15 NOTE — Patient Instructions (Signed)
Your physician has requested that you go to the basement for the following lab work before leaving today:  CBC, Ferritin  Please follow up in 6 months

## 2017-02-07 NOTE — Addendum Note (Signed)
Addendum  created 02/07/17 1018 by Myrtie Soman, MD   Sign clinical note

## 2017-05-06 ENCOUNTER — Encounter: Payer: Self-pay | Admitting: Internal Medicine

## 2017-07-13 ENCOUNTER — Ambulatory Visit: Payer: Medicare Other | Admitting: Internal Medicine

## 2017-07-13 ENCOUNTER — Encounter: Payer: Self-pay | Admitting: Internal Medicine

## 2017-07-13 ENCOUNTER — Other Ambulatory Visit (INDEPENDENT_AMBULATORY_CARE_PROVIDER_SITE_OTHER): Payer: Self-pay

## 2017-07-13 VITALS — BP 146/80 | HR 100 | Ht 63.0 in | Wt 158.0 lb

## 2017-07-13 DIAGNOSIS — D509 Iron deficiency anemia, unspecified: Secondary | ICD-10-CM | POA: Diagnosis not present

## 2017-07-13 DIAGNOSIS — K222 Esophageal obstruction: Secondary | ICD-10-CM | POA: Diagnosis not present

## 2017-07-13 DIAGNOSIS — K449 Diaphragmatic hernia without obstruction or gangrene: Secondary | ICD-10-CM | POA: Diagnosis not present

## 2017-07-13 DIAGNOSIS — K219 Gastro-esophageal reflux disease without esophagitis: Secondary | ICD-10-CM | POA: Diagnosis not present

## 2017-07-13 LAB — CBC WITH DIFFERENTIAL/PLATELET
Basophils Absolute: 0.1 10*3/uL (ref 0.0–0.1)
Basophils Relative: 0.7 % (ref 0.0–3.0)
EOS PCT: 0.8 % (ref 0.0–5.0)
Eosinophils Absolute: 0.1 10*3/uL (ref 0.0–0.7)
HCT: 39.2 % (ref 36.0–46.0)
Hemoglobin: 12.5 g/dL (ref 12.0–15.0)
LYMPHS ABS: 2.9 10*3/uL (ref 0.7–4.0)
Lymphocytes Relative: 31.4 % (ref 12.0–46.0)
MCHC: 31.8 g/dL (ref 30.0–36.0)
MCV: 84.9 fl (ref 78.0–100.0)
MONO ABS: 0.6 10*3/uL (ref 0.1–1.0)
Monocytes Relative: 6.4 % (ref 3.0–12.0)
NEUTROS ABS: 5.6 10*3/uL (ref 1.4–7.7)
NEUTROS PCT: 60.7 % (ref 43.0–77.0)
PLATELETS: 271 10*3/uL (ref 150.0–400.0)
RBC: 4.62 Mil/uL (ref 3.87–5.11)
RDW: 15.1 % (ref 11.5–15.5)
WBC: 9.3 10*3/uL (ref 4.0–10.5)

## 2017-07-13 LAB — FERRITIN: FERRITIN: 59.3 ng/mL (ref 10.0–291.0)

## 2017-07-13 NOTE — Patient Instructions (Signed)
Your physician has requested that you go to the basement for the following lab work before leaving today:  CBC Ferritin  Stay on your iron and Pantoprazole  Please follow up in one year

## 2017-07-13 NOTE — Progress Notes (Signed)
HISTORY OF PRESENT ILLNESS:  Meghan Stanley is a 71 y.o. female who was initially evaluated in the hospital January 2018 with profound iron deficiency anemia (hemoglobin 5.4, MCV 59.3) and recurrent problems with significant vomiting. Colonoscopy and upper endoscopy were performed. Colonoscopy revealed diminutive adenomas which were removed. Also left-sided diverticulosis and internal hemorrhoids. Follow-up in 5 years recommended. 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 The proximal web was dilated with the endoscope. A large hiatal hernia with Lysbeth Galas erosions was noted. Duodenal biopsies for sprue were negative. For reflux she was placed on PPI. For iron deficiency secondary to [facility] erosions she was placed on iron twice daily. She was seen in follow-up April 2018. She was doing well at that time on medical therapy. Blood work after that visit revealed improved hemoglobin of 12.1 with MCV 79.7. She was asked to follow-up at this time. The patient tells me that she continues to do well in terms of her swallowing. Also no recurrent problems with nausea and vomiting. She continues on pantoprazole. She has come off iron therapy forspecific reason. She tells me that she has been somewhat fatigued. GI review of systems is otherwise remarkable for occasional right-sided pain. It should be noted that CT scan of the abdomen and pelvis in January was performed without IV contrast. Large hernia and spine disease noted.  REVIEW OF SYSTEMS:  All non-GI ROS negative except for fatigue, urinary frequency, back pain  Past Medical History:  Diagnosis Date  . Anemia   . Colon polyps   . GERD (gastroesophageal reflux disease)   . Hemorrhoids   . Hiatal hernia   . Scoliosis     Past Surgical History:  Procedure Laterality Date  . NO PAST SURGERIES      Social History Meghan Stanley  reports that  has never smoked. she has never used smokeless tobacco. She reports that  she does not drink alcohol or use drugs.  family history includes Heart disease in her father; Hypertension in her mother.  No Known Allergies     PHYSICAL EXAMINATION: Vital signs: BP (!) 146/80   Pulse 100   Ht 5\' 3"  (1.6 m)   Wt 158 lb (71.7 kg)   BMI 27.99 kg/m   Constitutional: generally well-appearing, no acute distress Psychiatric: alert and oriented x3, cooperative Eyes: extraocular movements intact, anicteric, conjunctiva pink Mouth: oral pharynx moist, no lesions Neck: supple no lymphadenopathy Cardiovascular: heart regular rate and rhythm, no murmur Lungs: clear to auscultation bilaterally Abdomen: soft, nontender, nondistended, no obvious ascites, no peritoneal signs, normal bowel sounds, no organomegaly. Back brace in place Rectal: Omitted Extremities: no clubbing, cyanosis, lower extremity edema bilaterally Skin: no lesions on visible extremities Neuro: No focal deficits. Cranial nerves intact  ASSESSMENT:  #1. Iron deficiency anemia secondary to chronic blood loss from hiatal hernia associated Cameron erosions #2. Problems with recurrent nausea and vomiting secondary to large hiatal hernia. Improved on PPI #3. 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 I dilated with scope. Denies recurrent swallowing problems #4. Adenomatous colon polyps on colonoscopy   PLAN:  #1. Resume iron 325 mg twice daily. Continue indefinitely #2. Repeat CBC and ferritin level today #3. Reflux precautions #4. Continue PPI daily indefinitely #5. No definite indication for surgery at this time. However, if you have recurrent problems with symptomatic large hiatal hernia and/or refractory iron deficiency anemia then surgery could be considered. #6. Surveillance colonoscopy 5 years #7. GI office  follow-up one year. Patient has been instructed to contact this office sooner should she develop interval problems with vomiting  and/or dysphagia. She understands  25 minutes spent face-to-face with the patient. Greater than 50% a time use for counseling regarding her multiple GI diagnoses, the pathophysiology, treatment and management forward.

## 2018-08-01 ENCOUNTER — Ambulatory Visit: Payer: Self-pay | Admitting: Internal Medicine

## 2018-09-12 ENCOUNTER — Ambulatory Visit: Payer: Medicare Other | Admitting: Internal Medicine

## 2018-09-12 ENCOUNTER — Encounter: Payer: Self-pay | Admitting: Internal Medicine

## 2018-09-12 ENCOUNTER — Encounter (INDEPENDENT_AMBULATORY_CARE_PROVIDER_SITE_OTHER): Payer: Self-pay

## 2018-09-12 VITALS — BP 110/70 | HR 96 | Ht 60.25 in | Wt 161.1 lb

## 2018-09-12 DIAGNOSIS — Q394 Esophageal web: Secondary | ICD-10-CM | POA: Diagnosis not present

## 2018-09-12 DIAGNOSIS — D509 Iron deficiency anemia, unspecified: Secondary | ICD-10-CM | POA: Diagnosis not present

## 2018-09-12 DIAGNOSIS — K219 Gastro-esophageal reflux disease without esophagitis: Secondary | ICD-10-CM | POA: Diagnosis not present

## 2018-09-12 DIAGNOSIS — K449 Diaphragmatic hernia without obstruction or gangrene: Secondary | ICD-10-CM

## 2018-09-12 DIAGNOSIS — Z86010 Personal history of colonic polyps: Secondary | ICD-10-CM

## 2018-09-12 MED ORDER — FERROUS SULFATE 325 (65 FE) MG PO TABS: 325.0000 mg | ORAL_TABLET | Freq: Two times a day (BID) | ORAL | 3 refills | Status: DC

## 2018-09-12 MED ORDER — PANTOPRAZOLE SODIUM 40 MG PO TBEC: 40.0000 mg | DELAYED_RELEASE_TABLET | Freq: Every day | ORAL | 3 refills | Status: DC

## 2018-09-12 NOTE — Patient Instructions (Signed)
We have sent the following medications to your pharmacy for you to pick up at your convenience: Pantoprazole, Iron

## 2018-09-12 NOTE — Progress Notes (Signed)
HISTORY OF PRESENT ILLNESS:  Meghan Stanley is a 73 y.o. female with a history of severe iron deficiency anemia presumed secondary to [facility] erosions on endoscopic work-up September 29, 2016 while hospital inpatient (colonoscopy with diminutive polyps, diverticulosis, and internal hemorrhoids.  EGD with esophageal web in the proximal esophagus disrupted with the scope, large caliber distal esophageal stricture, large hiatal hernia, and otherwise normal exam).  Colon polyps were adenomatous.  Duodenal biopsies negative.  Was placed on iron twice daily.  Hemoglobin improved.  PPI for chronic GERD.  Presents today for follow-up.  Last evaluated in this office November 2018.  Outside blood work from Dr. Osborne Casco July 2019 shows mild anemia with hemoglobin 11.8.  Normal ferritin level.  Was told to decrease her iron to once daily.  She was tolerating iron well.  Her PPI was controlling reflux symptoms.  Told to decrease dosage.  Significant breakthrough.  No recurrent dysphasia.  No new complaints  REVIEW OF SYSTEMS:  All non-GI ROS negative unless otherwise stated in the HPI except for exertional shortness of breath, urinary leakage, muscle cramps, arthritis, back pain  Past Medical History:  Diagnosis Date  . Anemia   . Arthritis   . Colon polyps   . GERD (gastroesophageal reflux disease)   . Hemorrhoids   . Hiatal hernia   . HLD (hyperlipidemia)   . Scoliosis     Past Surgical History:  Procedure Laterality Date  . COLONOSCOPY N/A 09/29/2016   Procedure: COLONOSCOPY;  Surgeon: Irene Shipper, MD;  Location: [facility];  Service: Endoscopy;  Laterality: N/A;  . ESOPHAGOGASTRODUODENOSCOPY N/A 09/29/2016   Procedure: ESOPHAGOGASTRODUODENOSCOPY (EGD);  Surgeon: Irene Shipper, MD;  Location: [facility];  Service: Endoscopy;  Laterality: N/A;  . NO PAST SURGERIES      Social History Meghan Stanley  reports that she has never smoked. She has never used smokeless tobacco. She reports that she does not  drink alcohol or use drugs.  family history includes Heart disease in her father; Hypertension in her mother.  No Known Allergies     PHYSICAL EXAMINATION: Vital signs: BP 110/70 (BP Location: Left Arm, Patient Position: Sitting, Cuff Size: Normal)   Pulse 96   Ht 5' 0.25" (1.53 m) Comment: height measured without shoes  Wt 161 lb 2 oz (73.1 kg)   BMI 31.21 kg/m   Constitutional: generally well-appearing, no acute distress Psychiatric: alert and oriented x3, cooperative Eyes: extraocular movements intact, anicteric, conjunctiva pink Mouth: oral pharynx moist, no lesions Neck: supple no lymphadenopathy Cardiovascular: heart regular rate and rhythm, no murmur Lungs: clear to auscultation bilaterally Abdomen: soft, nontender, nondistended, no obvious ascites, no peritoneal signs, normal bowel sounds, no organomegaly Rectal: Omitted Extremities: no clubbing, cyanosis, or lower extremity edema bilaterally Skin: no lesions on visible extremities Neuro: No focal deficits.  Cranial nerves intact  ASSESSMENT:  1.  Iron deficiency anemia felt secondary to intermittent Cameron erosions from large hiatal hernia 2.  Proximal esophageal web disrupted with endoscope without recurrent dysphasia 3.  Classic GERD.  Requires PPI for control of symptoms 4.  Adenomatous colon polyp on colonoscopy   PLAN:  1.  Would resume iron twice daily as she tolerated this and was slightly anemic though her ferritin was normal 2.  PPI daily to control reflux symptoms 3.  Contact this office for recurrent dysphasia 4.  Routine surveillance colonoscopy due around January 2023.  Interval GI follow-up as needed 5.  Ongoing general medical care with Dr. Osborne Casco who monitors  her blood counts regularly  25-minute spent face-to-face with the patient.  Greater than 50% the time used for counseling regarding her iron deficiency anemia, its etiology, the rationale behind chronic iron therapy, management of chronic  GERD, and the possibility for recurrent esophageal dilation and the plans for surveillance colonoscopy.

## 2018-10-09 ENCOUNTER — Other Ambulatory Visit: Payer: Self-pay | Admitting: Internal Medicine

## 2018-10-09 DIAGNOSIS — I714 Abdominal aortic aneurysm, without rupture, unspecified: Secondary | ICD-10-CM

## 2018-10-09 LAB — IFOBT (OCCULT BLOOD): IFOBT: NEGATIVE

## 2018-10-13 ENCOUNTER — Other Ambulatory Visit: Payer: Self-pay | Admitting: Internal Medicine

## 2018-10-13 DIAGNOSIS — Z1231 Encounter for screening mammogram for malignant neoplasm of breast: Secondary | ICD-10-CM

## 2018-11-08 ENCOUNTER — Inpatient Hospital Stay: Admission: RE | Admit: 2018-11-08 | Payer: Self-pay | Source: Ambulatory Visit

## 2018-11-29 ENCOUNTER — Ambulatory Visit: Payer: Self-pay

## 2019-11-08 ENCOUNTER — Other Ambulatory Visit: Payer: Self-pay | Admitting: Internal Medicine

## 2019-11-08 DIAGNOSIS — I714 Abdominal aortic aneurysm, without rupture, unspecified: Secondary | ICD-10-CM

## 2019-11-09 ENCOUNTER — Other Ambulatory Visit: Payer: Self-pay | Admitting: Internal Medicine

## 2019-11-09 DIAGNOSIS — Z1231 Encounter for screening mammogram for malignant neoplasm of breast: Secondary | ICD-10-CM

## 2019-11-19 ENCOUNTER — Ambulatory Visit
Admission: RE | Admit: 2019-11-19 | Discharge: 2019-11-19 | Disposition: A | Discharge status: Home / Self Care | Payer: Medicare Other | Source: Ambulatory Visit | Attending: Internal Medicine | Admitting: Internal Medicine

## 2019-11-19 DIAGNOSIS — I714 Abdominal aortic aneurysm, without rupture, unspecified: Secondary | ICD-10-CM

## 2020-03-17 ENCOUNTER — Other Ambulatory Visit: Payer: Self-pay | Admitting: Internal Medicine

## 2020-09-13 ENCOUNTER — Other Ambulatory Visit: Payer: Self-pay | Admitting: Internal Medicine

## 2020-10-04 ENCOUNTER — Other Ambulatory Visit: Payer: Self-pay | Admitting: Internal Medicine

## 2021-01-21 ENCOUNTER — Other Ambulatory Visit: Payer: Self-pay

## 2021-01-21 DIAGNOSIS — I714 Abdominal aortic aneurysm, without rupture, unspecified: Secondary | ICD-10-CM

## 2021-02-01 ENCOUNTER — Ambulatory Visit (HOSPITAL_COMMUNITY)
Admission: EM | Admit: 2021-02-01 | Discharge: 2021-02-01 | Disposition: A | Discharge status: Home / Self Care | Payer: Medicare Other | Attending: Emergency Medicine | Admitting: Emergency Medicine

## 2021-02-01 ENCOUNTER — Encounter (HOSPITAL_COMMUNITY): Payer: Self-pay

## 2021-02-01 DIAGNOSIS — T23001A Burn of unspecified degree of right hand, unspecified site, initial encounter: Secondary | ICD-10-CM | POA: Diagnosis not present

## 2021-02-01 DIAGNOSIS — T300 Burn of unspecified body region, unspecified degree: Secondary | ICD-10-CM

## 2021-02-01 MED ORDER — DOXYCYCLINE HYCLATE 100 MG PO CAPS: 100.0000 mg | ORAL_CAPSULE | Freq: Two times a day (BID) | ORAL | 0 refills | Status: DC

## 2021-02-01 NOTE — ED Provider Notes (Signed)
[facility]    CSN: [id number] Arrival date & time: 02/01/21  1001      History   Chief Complaint Chief Complaint  Patient presents with  . Burn    HPI Meghan Stanley is a 75 y.o. female.   Patient here for evaluation of right thumb burn.  Reports burning thumb on stove about 1 week ago and now is concerned that it is infected.  Reports using neosporin to clean it.  Denies any drainage.  Denies any decreased ROM.  Denies any specific alleviating or aggravating factors.  Denies any fevers, chest pain, shortness of breath, N/V/D, numbness, tingling, weakness, abdominal pain, or headaches.    The history is provided by the patient.  Burn   Past Medical History:  Diagnosis Date  . Anemia   . Arthritis   . Colon polyps   . GERD (gastroesophageal reflux disease)   . Hemorrhoids   . Hiatal hernia   . HLD (hyperlipidemia)   . Scoliosis     Patient Active Problem List   Diagnosis Date Noted  . Iron deficiency anemia due to chronic blood loss   . Esophageal stricture   . Hiatal hernia   . Benign neoplasm of ascending colon   . Benign neoplasm of descending colon   . Symptomatic anemia 09/27/2016  . Thrombocytosis 09/27/2016  . Hyperglycemia 09/27/2016  . Scoliosis 09/27/2016    Past Surgical History:  Procedure Laterality Date  . COLONOSCOPY N/A 09/29/2016   Procedure: COLONOSCOPY;  Surgeon: Irene Shipper, MD;  Location: [facility];  Service: Endoscopy;  Laterality: N/A;  . ESOPHAGOGASTRODUODENOSCOPY N/A 09/29/2016   Procedure: ESOPHAGOGASTRODUODENOSCOPY (EGD);  Surgeon: Irene Shipper, MD;  Location: [facility];  Service: Endoscopy;  Laterality: N/A;  . NO PAST SURGERIES      OB History   No obstetric history on file.      Home Medications    Prior to Admission medications   Medication Sig Start Date End Date Taking? Authorizing Provider  doxycycline (VIBRAMYCIN) 100 MG capsule Take 1 capsule (100 mg total) by mouth 2 (two) times daily. 02/01/21   Yes Pearson Forster, NP  acetaminophen (TYLENOL) 500 MG tablet Take 1,000 mg by mouth every 6 (six) hours as needed.    [provider]  amLODipine (NORVASC) 5 MG tablet Take 1 tablet (5 mg total) by mouth daily. 09/29/16   Hongalgi, Lenis Dickinson, MD  docusate sodium (COLACE) 100 MG capsule Take 1 capsule (100 mg total) by mouth 2 (two) times daily. 09/29/16   Hongalgi, Lenis Dickinson, MD  ferrous sulfate 325 (65 FE) MG tablet Take 1 tablet (325 mg total) by mouth 2 (two) times daily. 09/12/18   Irene Shipper, MD  losartan-hydrochlorothiazide (HYZAAR) 50-12.5 MG tablet Take 1 tablet by mouth daily. 12/09/16   [provider]  pantoprazole (PROTONIX) 40 MG tablet TAKE 1 TABLET BY MOUTH EVERY DAY 03/17/20   Irene Shipper, MD  rosuvastatin (CRESTOR) 10 MG tablet Take 1 tablet by mouth daily.    [provider]    Family History Family History  Problem Relation Age of Onset  . Hypertension Mother   . Heart disease Father     Social History Social History   Tobacco Use  . Smoking status: Never Smoker  . Smokeless tobacco: Never Used  Substance Use Topics  . Alcohol use: No  . Drug use: No     Allergies   Patient has no known allergies.   Review  of Systems Review of Systems  Skin: Positive for wound.  All other systems reviewed and are negative.    Physical Exam Triage Vital Signs ED Triage Vitals  Enc Vitals Group     BP 02/01/21 1058 (!) 144/71     Pulse Rate 02/01/21 1058 95     Resp 02/01/21 1058 18     Temp 02/01/21 1058 98.6 F (37 C)     Temp Source 02/01/21 1058 Oral     SpO2 02/01/21 1058 98 %     Weight --      Height --      Head Circumference --      Peak Flow --      Pain Score 02/01/21 1056 5     Pain Loc --      Pain Edu? --      Excl. in [place]? --    No data found.  Updated Vital Signs BP (!) 144/71 (BP Location: Left Arm)   Pulse 95   Temp 98.6 F (37 C) (Oral)   Resp 18   SpO2 98%   Visual Acuity Right Eye Distance:   Left Eye  Distance:   Bilateral Distance:    Right Eye Near:   Left Eye Near:    Bilateral Near:     Physical Exam Vitals and nursing note reviewed.  Constitutional:      General: She is not in acute distress.    Appearance: Normal appearance. She is not ill-appearing, toxic-appearing or diaphoretic.  HENT:     Head: Normocephalic and atraumatic.  Eyes:     Conjunctiva/sclera: Conjunctivae normal.  Cardiovascular:     Rate and Rhythm: Normal rate.     Pulses: Normal pulses.  Pulmonary:     Effort: Pulmonary effort is normal.  Abdominal:     General: Abdomen is flat.  Musculoskeletal:        General: Normal range of motion.     Cervical back: Normal range of motion.  Skin:    General: Skin is warm and dry.     Findings: Wound (see photo of right thumb below ) present.  Neurological:     General: No focal deficit present.     Mental Status: She is alert and oriented to person, place, and time.  Psychiatric:        Mood and Affect: Mood normal.        UC Treatments / Results  Labs (all labs ordered are listed, but only abnormal results are displayed) Labs Reviewed - No data to display  EKG   Radiology No results found.  Procedures Procedures (including critical care time)  Medications Ordered in UC Medications - No data to display  Initial Impression / Assessment and Plan / UC Course  I have reviewed the triage vital signs and the nursing notes.  Pertinent labs & imaging results that were available during my care of the patient were reviewed by me and considered in my medical decision making (see chart for details).    Concern for possible infection to left thumb burn.  Will treat with doxycycline twice a day for the next 10 days.  Recommend warm compress or epsom salt soak 3-4 times a day for comfort.  Strict follow up for any worsening signs of infection.  Follow up with primary care as needed.   Final Clinical Impressions(s) / UC Diagnoses   Final diagnoses:   Burn     Discharge Instructions     Take the doxycycline  twice a day for the next 10 days.    Apply a warm compress or soak your finger in an epsom salt soak 3-4 times a day for comfort.     If you notice any worsening swelling, redness, pain, or have red streaks going up your finger/hand, come back for re-evaluation.      ED Prescriptions    Medication Sig Dispense Auth. Provider   doxycycline (VIBRAMYCIN) 100 MG capsule Take 1 capsule (100 mg total) by mouth 2 (two) times daily. 20 capsule Pearson Forster, NP     PDMP not reviewed this encounter.   Pearson Forster, NP 02/01/21 1147

## 2021-02-01 NOTE — ED Triage Notes (Signed)
Pt reports burnt right thumb finger with the stove 1 week ago.

## 2021-02-01 NOTE — Discharge Instructions (Addendum)
Take the doxycycline twice a day for the next 10 days.    Apply a warm compress or soak your finger in an epsom salt soak 3-4 times a day for comfort.     If you notice any worsening swelling, redness, pain, or have red streaks going up your finger/hand, come back for re-evaluation.

## 2021-02-06 ENCOUNTER — Ambulatory Visit: Payer: Medicare Other | Admitting: Vascular Surgery

## 2021-02-06 ENCOUNTER — Encounter: Payer: Self-pay | Admitting: Vascular Surgery

## 2021-02-06 ENCOUNTER — Ambulatory Visit (HOSPITAL_COMMUNITY)
Admission: RE | Admit: 2021-02-06 | Discharge: 2021-02-06 | Disposition: A | Discharge status: Home / Self Care | Payer: Medicare Other | Source: Ambulatory Visit | Attending: Vascular Surgery | Admitting: Vascular Surgery

## 2021-02-06 ENCOUNTER — Other Ambulatory Visit: Payer: Self-pay

## 2021-02-06 VITALS — BP 147/81 | HR 92 | Temp 98.0°F | Resp 20 | Ht 60.0 in | Wt 158.0 lb

## 2021-02-06 DIAGNOSIS — I714 Abdominal aortic aneurysm, without rupture, unspecified: Secondary | ICD-10-CM

## 2021-02-06 NOTE — Progress Notes (Signed)
Patient ID: Meghan Stanley, female   DOB: 09/20/1945, 75 y.o.   MRN: [id number]  Reason for Consult: New Patient (Initial Visit)   Referred by Tisovec, Fransico Him, MD  Subjective:     HPI:  [name] is a 75 y.o. female with known history of abdominal aortic aneurysm.  She is a lifelong non-smoker.  She does have hyperlipidemia.  She denies any previous vascular disease.  She does not know of any personal or family history of aneurysm disease.  She denies any history of stroke, TIA or amaurosis.  Her greatest limitation to walking a scoliosis and arthritis.  She does not have any tissue loss or ulceration of her bilateral lower extremities.  Past Medical History:  Diagnosis Date  . Anemia   . Arthritis   . Colon polyps   . GERD (gastroesophageal reflux disease)   . Hemorrhoids   . Hiatal hernia   . HLD (hyperlipidemia)   . Scoliosis    Family History  Problem Relation Age of Onset  . Hypertension Mother   . Heart disease Father    Past Surgical History:  Procedure Laterality Date  . COLONOSCOPY N/A 09/29/2016   Procedure: COLONOSCOPY;  Surgeon: Irene Shipper, MD;  Location: [facility];  Service: Endoscopy;  Laterality: N/A;  . ESOPHAGOGASTRODUODENOSCOPY N/A 09/29/2016   Procedure: ESOPHAGOGASTRODUODENOSCOPY (EGD);  Surgeon: Irene Shipper, MD;  Location: [facility];  Service: Endoscopy;  Laterality: N/A;  . NO PAST SURGERIES      Short Social History:  Social History   Tobacco Use  . Smoking status: Never Smoker  . Smokeless tobacco: Never Used  Substance Use Topics  . Alcohol use: No    No Known Allergies  Current Outpatient Medications  Medication Sig Dispense Refill  . acetaminophen (TYLENOL) 500 MG tablet Take 1,000 mg by mouth every 6 (six) hours as needed.    Marland Kitchen amLODipine (NORVASC) 10 MG tablet Take 1 tablet by mouth daily.    Marland Kitchen docusate sodium (COLACE) 100 MG capsule Take 1 capsule (100 mg total) by mouth 2 (two) times daily. 60 capsule 0  . doxycycline  (VIBRAMYCIN) 100 MG capsule Take 1 capsule (100 mg total) by mouth 2 (two) times daily. 20 capsule 0  . ferrous sulfate 325 (65 FE) MG tablet Take 1 tablet (325 mg total) by mouth 2 (two) times daily. 180 tablet 3  . latanoprost (XALATAN) 0.005 % ophthalmic solution 1 drop at bedtime.    Marland Kitchen losartan-hydrochlorothiazide (HYZAAR) 50-12.5 MG tablet Take 1 tablet by mouth daily.    . pantoprazole (PROTONIX) 40 MG tablet TAKE 1 TABLET BY MOUTH EVERY DAY 90 tablet 1  . rosuvastatin (CRESTOR) 10 MG tablet Take 1 tablet by mouth daily.     No current facility-administered medications for this visit.    Review of Systems  Constitutional:  Constitutional negative. HENT: HENT negative.  Respiratory: Respiratory negative.  Cardiovascular: Cardiovascular negative.  GI: Gastrointestinal negative.  Musculoskeletal: Musculoskeletal negative.  Skin: Skin negative.  Neurological: Neurological negative. Hematologic: Hematologic/lymphatic negative.  Psychiatric: Psychiatric negative.        Objective:  Objective   Vitals:   02/06/21 0941  BP: (!) 147/81  Pulse: 92  Resp: 20  Temp: 98 F (36.7 C)  SpO2: 96%  Weight: 158 lb (71.7 kg)  Height: 5' (1.524 m)   Body mass index is 30.86 kg/m.  Physical Exam HENT:     Head: Normocephalic.     Nose:     Comments:  Wearing a mask Eyes:     Pupils: Pupils are equal, round, and reactive to light.  Cardiovascular:     Pulses:          Radial pulses are 2+ on the right side and 2+ on the left side.       Popliteal pulses are 2+ on the right side and 2+ on the left side.       Dorsalis pedis pulses are 2+ on the right side.       Posterior tibial pulses are 2+ on the left side.  Pulmonary:     Effort: Pulmonary effort is normal.  Abdominal:     General: Abdomen is flat.     Palpations: Abdomen is soft.  Skin:    General: Skin is warm and dry.     Capillary Refill: Capillary refill takes less than 2 seconds.  Neurological:     General: No  focal deficit present.     Mental Status: She is alert.  Psychiatric:        Mood and Affect: Mood normal.        Thought Content: Thought content normal.        Judgment: Judgment normal.     Data: Abdominal Aorta Findings:  +-----------+-------+----------+----------+--------+--------+--------+  Location  AP (cm)Trans (cm)PSV (cm/s)WaveformThrombusComments  +-----------+-------+----------+----------+--------+--------+--------+  Proximal  2.06  2.06                      +-----------+-------+----------+----------+--------+--------+--------+  Mid    2.79                    saccular  +-----------+-------+----------+----------+--------+--------+--------+  RT CIA Prox1.3  1.4    150                  +-----------+-------+----------+----------+--------+--------+--------+  LT CIA Prox1.4  1.4    100                  +-----------+-------+----------+----------+--------+--------+--------+         Summary:  Abdominal Aorta: There is evidence of abnormal dilatation of the mid  Abdominal aorta.      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  Though it does appear saccular on ultrasound today I reviewed her previous CT as well as ultrasound from last year this is mostly appears to be fusiform and tortuous given her history of scoliosis.  For this reason we will have her back in 2 years with repeat duplex.  Have discussed the signs and symptoms of rupture as well as the risk of having an aneurysm and she demonstrates good understanding.  She is okay for activities as tolerated as long as she does not strain with lifting.     Waynetta Sandy MD [facility]

## 2021-02-14 ENCOUNTER — Emergency Department (HOSPITAL_COMMUNITY): Payer: Medicare Other

## 2021-02-14 ENCOUNTER — Emergency Department (HOSPITAL_COMMUNITY)
Admission: EM | Admit: 2021-02-14 | Discharge: 2021-02-15 | Disposition: A | Discharge status: Home / Self Care | Payer: Medicare Other | Attending: Emergency Medicine | Admitting: Emergency Medicine

## 2021-02-14 DIAGNOSIS — R4182 Altered mental status, unspecified: Secondary | ICD-10-CM

## 2021-02-14 DIAGNOSIS — Z79899 Other long term (current) drug therapy: Secondary | ICD-10-CM | POA: Insufficient documentation

## 2021-02-14 DIAGNOSIS — Z8659 Personal history of other mental and behavioral disorders: Secondary | ICD-10-CM

## 2021-02-14 DIAGNOSIS — Z20822 Contact with and (suspected) exposure to covid-19: Secondary | ICD-10-CM | POA: Diagnosis not present

## 2021-02-14 DIAGNOSIS — G9340 Encephalopathy, unspecified: Secondary | ICD-10-CM | POA: Insufficient documentation

## 2021-02-14 DIAGNOSIS — R410 Disorientation, unspecified: Secondary | ICD-10-CM | POA: Diagnosis present

## 2021-02-14 LAB — CBC WITH DIFFERENTIAL/PLATELET
Abs Immature Granulocytes: 0.03 10*3/uL (ref 0.00–0.07)
Basophils Absolute: 0.1 10*3/uL (ref 0.0–0.1)
Basophils Relative: 1 %
Eosinophils Absolute: 0 10*3/uL (ref 0.0–0.5)
Eosinophils Relative: 0 %
HCT: 39.1 % (ref 36.0–46.0)
Hemoglobin: 12.6 g/dL (ref 12.0–15.0)
Immature Granulocytes: 0 %
Lymphocytes Relative: 18 %
Lymphs Abs: 1.6 10*3/uL (ref 0.7–4.0)
MCH: 26.4 pg (ref 26.0–34.0)
MCHC: 32.2 g/dL (ref 30.0–36.0)
MCV: 82 fL (ref 80.0–100.0)
Monocytes Absolute: 0.5 10*3/uL (ref 0.1–1.0)
Monocytes Relative: 6 %
Neutro Abs: 6.9 10*3/uL (ref 1.7–7.7)
Neutrophils Relative %: 75 %
Platelets: 229 10*3/uL (ref 150–400)
RBC: 4.77 MIL/uL (ref 3.87–5.11)
RDW: 14.4 % (ref 11.5–15.5)
WBC: 9.2 10*3/uL (ref 4.0–10.5)
nRBC: 0 % (ref 0.0–0.2)

## 2021-02-14 LAB — COMPREHENSIVE METABOLIC PANEL
ALT: 13 U/L (ref 0–44)
AST: 21 U/L (ref 15–41)
Albumin: 3.7 g/dL (ref 3.5–5.0)
Alkaline Phosphatase: 57 U/L (ref 38–126)
Anion gap: 10 (ref 5–15)
BUN: 18 mg/dL (ref 8–23)
CO2: 27 mmol/L (ref 22–32)
Calcium: 9.4 mg/dL (ref 8.9–10.3)
Chloride: 98 mmol/L (ref 98–111)
Creatinine, Ser: 0.89 mg/dL (ref 0.44–1.00)
GFR, Estimated: >60 mL/min (ref >60)
Glucose, Bld: 112 mg/dL — ABNORMAL HIGH (ref 70–99)
Potassium: 3.1 mmol/L — ABNORMAL LOW (ref 3.5–5.1)
Sodium: 135 mmol/L (ref 135–145)
Total Bilirubin: 0.8 mg/dL (ref 0.3–1.2)
Total Protein: 7 g/dL (ref 6.5–8.1)

## 2021-02-14 LAB — URINALYSIS, ROUTINE W REFLEX MICROSCOPIC
Bilirubin Urine: NEGATIVE
Glucose, UA: NEGATIVE mg/dL
Hgb urine dipstick: NEGATIVE
Ketones, ur: NEGATIVE mg/dL
Nitrite: NEGATIVE
Protein, ur: NEGATIVE mg/dL
Specific Gravity, Urine: 1.01 (ref 1.005–1.030)
pH: 6 (ref 5.0–8.0)

## 2021-02-14 LAB — RAPID URINE DRUG SCREEN, HOSP PERFORMED
Amphetamines: NOT DETECTED
Barbiturates: NOT DETECTED
Benzodiazepines: NOT DETECTED
Cocaine: NOT DETECTED
Opiates: NOT DETECTED
Tetrahydrocannabinol: NOT DETECTED

## 2021-02-14 LAB — URINALYSIS, MICROSCOPIC (REFLEX)

## 2021-02-14 LAB — RESP PANEL BY RT-PCR (FLU A&B, COVID) ARPGX2
Influenza A by PCR: NEGATIVE
Influenza B by PCR: NEGATIVE
SARS Coronavirus 2 by RT PCR: NEGATIVE

## 2021-02-14 LAB — ETHANOL: Alcohol, Ethyl (B): <10 mg/dL (ref <10)

## 2021-02-14 MED ORDER — CEPHALEXIN 250 MG PO CAPS: 500.0000 mg | ORAL_CAPSULE | Freq: Once | ORAL | Status: DC

## 2021-02-14 MED ORDER — POTASSIUM CHLORIDE CRYS ER 20 MEQ PO TBCR
40.0000 meq | EXTENDED_RELEASE_TABLET | Freq: Once | ORAL | Status: AC
  Administered 2021-02-14: 40 meq via ORAL
  Filled 2021-02-14: qty 2
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
## 2021-02-14 NOTE — ED Triage Notes (Signed)
Pt via EMS from home for eval of intermittent confusion x 2 weeks. Pt paranoid about being convicted for selling drugs, then on arrival to ED became agitated and started yelling about religious content. Pt brought to treatment room and is calm, cooperative, and answers all orientation questions appropriately.

## 2021-02-15 MED ORDER — CEPHALEXIN 500 MG PO CAPS: 500.0000 mg | ORAL_CAPSULE | Freq: Three times a day (TID) | ORAL | 0 refills | Status: DC

## 2021-02-15 NOTE — Discharge Instructions (Signed)
It was our pleasure to provide your ER care today - we hope that you feel better.  Follow up with primary care doctor this coming week - call office Monday to arrange appointment - discuss possible referral to neurology and/or geropsych specialist then.   The lab tests show a possible urine infection - take keflex as prescribed.   Return to ER if worse, new symptoms, fevers, new or severe pain, trouble breathing, or other concern.

## 2021-02-15 NOTE — ED Provider Notes (Signed)
Patient signed by Dr Alvino Chapel that suspects dementia, with occasional/transient odd behavior in past few weeks, but that patient is currently alert, oriented x 4 - he indicates if MRI neg may d/c, with outpt f/u.  1220, MRI read is pending. On recheck, pt remains fully alert and oriented. Speech is normal, no aphasia or dysarthria. Pt exhibits normal mood and affect. No delusional or paranoid thoughts. No hallucinations. Does not appear to be responding to internal stimuli.  UA does have 6-10 wbc, will cx and give rx keflex for few days. Also rec close pcp f/u Monday - discuss possible neurology or geropsyh referral then.   Signed out to Dr Stark Jock with plan to d/c to home if MRI neg acute.      Lajean Saver, MD 02/15/21 [phone]

## 2021-02-17 LAB — URINE CULTURE: Culture: >=100000 — AB

## 2021-02-18 ENCOUNTER — Telehealth: Payer: Self-pay | Admitting: Emergency Medicine

## 2021-02-18 NOTE — Telephone Encounter (Signed)
Post ED Visit - Positive Culture Follow-up  Culture report reviewed by antimicrobial stewardship pharmacist: [facility] Team []  Elenor Quinones, Pharm.D. []  Heide Guile, Pharm.D., BCPS AQ-ID []  Parks Neptune, Pharm.D., BCPS []  Alycia Rossetti, Pharm.D., BCPS []  Uvalda, Pharm.D., BCPS, AAHIVP []  Legrand Como, Pharm.D., BCPS, AAHIVP []  Salome Arnt, PharmD, BCPS []  Johnnette Gourd, PharmD, BCPS []  Hughes Better, PharmD, BCPS []  Leeroy Cha, PharmD []  Laqueta Linden, PharmD, BCPS []  Albertina Parr, PharmD  [place] Team []  Leodis Sias, PharmD []  Lindell Spar, PharmD []  Royetta Asal, PharmD []  Graylin Shiver, Rph []  Rema Fendt) Glennon Mac, PharmD []  Arlyn Dunning, PharmD []  Netta Cedars, PharmD []  Dia Sitter, PharmD []  Leone Haven, PharmD []  Gretta Arab, PharmD []  Theodis Shove, PharmD []  Peggyann Juba, PharmD []  Reuel Boom, PharmD [facility] PharmD   Positive urine culture Treated with cephalexin, organism sensitive to the same and no further patient follow-up is required at this time.  Hazle Nordmann 02/18/2021, 1:31 PM

## 2021-02-24 ENCOUNTER — Other Ambulatory Visit: Payer: Self-pay

## 2021-02-24 ENCOUNTER — Encounter (HOSPITAL_COMMUNITY): Payer: Self-pay

## 2021-02-24 ENCOUNTER — Ambulatory Visit (HOSPITAL_COMMUNITY)
Admission: EM | Admit: 2021-02-24 | Discharge: 2021-02-24 | Disposition: A | Discharge status: Home / Self Care | Payer: Medicare Other | Attending: Emergency Medicine | Admitting: Emergency Medicine

## 2021-02-24 DIAGNOSIS — N3000 Acute cystitis without hematuria: Secondary | ICD-10-CM | POA: Diagnosis present

## 2021-02-24 DIAGNOSIS — T23011D Burn of unspecified degree of right thumb (nail), subsequent encounter: Secondary | ICD-10-CM | POA: Diagnosis present

## 2021-02-24 LAB — POCT URINALYSIS DIPSTICK, ED / UC
Glucose, UA: NEGATIVE mg/dL
Hgb urine dipstick: NEGATIVE
Nitrite: NEGATIVE
Protein, ur: NEGATIVE mg/dL
Specific Gravity, Urine: 1.025 (ref 1.005–1.030)
Urobilinogen, UA: 2 mg/dL — ABNORMAL HIGH (ref 0.0–1.0)
pH: 7 (ref 5.0–8.0)

## 2021-02-24 MED ORDER — SULFAMETHOXAZOLE-TRIMETHOPRIM 800-160 MG PO TABS: 1.0000 | ORAL_TABLET | Freq: Two times a day (BID) | ORAL | 0 refills | Status: DC

## 2021-02-24 MED ORDER — LOSARTAN POTASSIUM-HCTZ 50-12.5 MG PO TABS: 1.0000 | ORAL_TABLET | Freq: Every day | ORAL | 1 refills | Status: DC
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
## 2021-02-24 NOTE — Discharge Instructions (Addendum)
Your thumb wound does not appear infected today and is healing properly  Please increase your water intake to help prevent dehydration and help with dizziness, can aim for 8 8oz glasses of water a day   If dizziness continues after two weeks and  after increasing water intake and completing medications, please follow up with primary doctor   Take 1 bactrim pill in the morning and 1 pill in the evening for 3 days   Urine has been sent to the lab to see if bacteria grows, if changes need to made to your medication a [phone] number will call you   If wanting to change physicians you may call different practices to see if they are accepting new patients and make an appointment

## 2021-02-26 ENCOUNTER — Encounter (HOSPITAL_COMMUNITY): Payer: Self-pay | Admitting: Emergency Medicine

## 2021-02-26 ENCOUNTER — Emergency Department (HOSPITAL_COMMUNITY)
Admission: EM | Admit: 2021-02-26 | Discharge: 2021-02-26 | Disposition: A | Discharge status: Home / Self Care | Payer: Medicare Other | Attending: Emergency Medicine | Admitting: Emergency Medicine

## 2021-02-26 DIAGNOSIS — B9689 Other specified bacterial agents as the cause of diseases classified elsewhere: Secondary | ICD-10-CM | POA: Diagnosis not present

## 2021-02-26 DIAGNOSIS — Z85038 Personal history of other malignant neoplasm of large intestine: Secondary | ICD-10-CM | POA: Insufficient documentation

## 2021-02-26 DIAGNOSIS — R350 Frequency of micturition: Secondary | ICD-10-CM | POA: Diagnosis present

## 2021-02-26 DIAGNOSIS — N390 Urinary tract infection, site not specified: Secondary | ICD-10-CM | POA: Insufficient documentation

## 2021-02-26 DIAGNOSIS — E860 Dehydration: Secondary | ICD-10-CM | POA: Insufficient documentation

## 2021-02-26 DIAGNOSIS — R410 Disorientation, unspecified: Secondary | ICD-10-CM | POA: Insufficient documentation

## 2021-02-26 DIAGNOSIS — N3000 Acute cystitis without hematuria: Secondary | ICD-10-CM

## 2021-02-26 LAB — CBC WITH DIFFERENTIAL/PLATELET
Abs Immature Granulocytes: 0.02 10*3/uL (ref 0.00–0.07)
Basophils Absolute: 0.1 10*3/uL (ref 0.0–0.1)
Basophils Relative: 1 %
Eosinophils Absolute: 0.1 10*3/uL (ref 0.0–0.5)
Eosinophils Relative: 1 %
HCT: 42 % (ref 36.0–46.0)
Hemoglobin: 13.6 g/dL (ref 12.0–15.0)
Immature Granulocytes: 0 %
Lymphocytes Relative: 26 %
Lymphs Abs: 2.6 10*3/uL (ref 0.7–4.0)
MCH: 26.8 pg (ref 26.0–34.0)
MCHC: 32.4 g/dL (ref 30.0–36.0)
MCV: 82.7 fL (ref 80.0–100.0)
Monocytes Absolute: 0.8 10*3/uL (ref 0.1–1.0)
Monocytes Relative: 8 %
Neutro Abs: 6.3 10*3/uL (ref 1.7–7.7)
Neutrophils Relative %: 64 %
Platelets: 279 10*3/uL (ref 150–400)
RBC: 5.08 MIL/uL (ref 3.87–5.11)
RDW: 15.1 % (ref 11.5–15.5)
WBC: 9.9 10*3/uL (ref 4.0–10.5)
nRBC: 0 % (ref 0.0–0.2)

## 2021-02-26 LAB — COMPREHENSIVE METABOLIC PANEL
ALT: 15 U/L (ref 0–44)
AST: 21 U/L (ref 15–41)
Albumin: 4.6 g/dL (ref 3.5–5.0)
Alkaline Phosphatase: 65 U/L (ref 38–126)
Anion gap: 13 (ref 5–15)
BUN: 12 mg/dL (ref 8–23)
CO2: 24 mmol/L (ref 22–32)
Calcium: 10.2 mg/dL (ref 8.9–10.3)
Chloride: 98 mmol/L (ref 98–111)
Creatinine, Ser: 1.12 mg/dL — ABNORMAL HIGH (ref 0.44–1.00)
GFR, Estimated: 51 mL/min — ABNORMAL LOW (ref >60)
Glucose, Bld: 103 mg/dL — ABNORMAL HIGH (ref 70–99)
Potassium: 3.1 mmol/L — ABNORMAL LOW (ref 3.5–5.1)
Sodium: 135 mmol/L (ref 135–145)
Total Bilirubin: 1.3 mg/dL — ABNORMAL HIGH (ref 0.3–1.2)
Total Protein: 8.5 g/dL — ABNORMAL HIGH (ref 6.5–8.1)

## 2021-02-26 LAB — URINALYSIS, ROUTINE W REFLEX MICROSCOPIC
Bilirubin Urine: NEGATIVE
Glucose, UA: NEGATIVE mg/dL
Hgb urine dipstick: NEGATIVE
Ketones, ur: NEGATIVE mg/dL
Nitrite: NEGATIVE
Protein, ur: NEGATIVE mg/dL
Specific Gravity, Urine: 1.017 (ref 1.005–1.030)
pH: 6 (ref 5.0–8.0)

## 2021-02-26 LAB — URINE CULTURE: Culture: 20000 — AB

## 2021-02-26 MED ORDER — SULFAMETHOXAZOLE-TRIMETHOPRIM 800-160 MG PO TABS: 1.0000 | ORAL_TABLET | Freq: Two times a day (BID) | ORAL | 0 refills | Status: AC

## 2021-02-26 MED ORDER — SODIUM CHLORIDE 0.9 % IV SOLN
1.0000 g | Freq: Once | INTRAVENOUS | Status: AC
  Administered 2021-02-26: 1 g via INTRAVENOUS
  Filled 2021-02-26: qty 10

## 2021-02-26 MED ORDER — POTASSIUM CHLORIDE CRYS ER 20 MEQ PO TBCR
40.0000 meq | EXTENDED_RELEASE_TABLET | Freq: Once | ORAL | Status: AC
  Administered 2021-02-26: 40 meq via ORAL
  Filled 2021-02-26: qty 2

## 2021-02-26 NOTE — ED Triage Notes (Signed)
Family states that pt has had a recurrent UTI that she took a 5 days course of antibiotics for two weeks ago and started another antibiotic 2 days ago but pt seemed agitated and not herself yesterday. Agitation seems to have resolved today per daughter. Alert and oriented.

## 2021-02-26 NOTE — ED Provider Notes (Signed)
[facility] DEPT Provider Note   CSN: [id number] Arrival date & time: 02/26/21  1447     History Chief Complaint  Patient presents with   Urinary Tract Infection    Meghan Stanley is a 75 y.o. female.  HPI  75 year old female with past medical history of HLD, arthritis scoliosis presents emergency department accompanied with her daughter for concern of ongoing confusion.  A couple weeks ago patient was seen for evaluation of weakness and confusion.  She was diagnosed with a urinary tract infection and sent home with antibiotics.  The daughter suspects that she did not take the antibiotics.  3 days ago she was seen again for the same complaints.  Diagnosed with urinary tract infection, urine culture grew Proteus mirabilis and she was placed on Bactrim.  However again the daughter states that she is confused and not taking her medications as prescribed.  It appears in the past 3 days the patient has taken 2 pills and thrown the rest of the prescription away.  She continues to endorse urinary frequency.  Daughter states that she has been confused and agitated at times however at this time is baseline.  No reported fever, vomiting, diarrhea.  Past Medical History:  Diagnosis Date   Anemia    Arthritis    Colon polyps    GERD (gastroesophageal reflux disease)    Hemorrhoids    Hiatal hernia    HLD (hyperlipidemia)    Scoliosis     Patient Active Problem List   Diagnosis Date Noted   Iron deficiency anemia due to chronic blood loss    Esophageal stricture    Hiatal hernia    Benign neoplasm of ascending colon    Benign neoplasm of descending colon    Symptomatic anemia 09/27/2016   Thrombocytosis 09/27/2016   Hyperglycemia 09/27/2016   Scoliosis 09/27/2016    Past Surgical History:  Procedure Laterality Date   COLONOSCOPY N/A 09/29/2016   Procedure: COLONOSCOPY;  Surgeon: Irene Shipper, MD;  Location: [facility];  Service: Endoscopy;  Laterality:  N/A;   ESOPHAGOGASTRODUODENOSCOPY N/A 09/29/2016   Procedure: ESOPHAGOGASTRODUODENOSCOPY (EGD);  Surgeon: Irene Shipper, MD;  Location: [facility];  Service: Endoscopy;  Laterality: N/A;   NO PAST SURGERIES       OB History   No obstetric history on file.     Family History  Problem Relation Age of Onset   Hypertension Mother    Heart disease Father    Glaucoma Son     Social History   Tobacco Use   Smoking status: Never   Smokeless tobacco: Never  Vaping Use   Vaping Use: Never used  Substance Use Topics   Alcohol use: No   Drug use: No    Home Medications Prior to Admission medications   Medication Sig Start Date End Date Taking? Authorizing Provider  acetaminophen (TYLENOL) 500 MG tablet Take 1,000 mg by mouth as needed (pain).    [provider]  amLODipine (NORVASC) 10 MG tablet Take 10 mg by mouth at bedtime. 11/11/20   [provider]  cephALEXin (KEFLEX) 500 MG capsule Take 1 capsule (500 mg total) by mouth 3 (three) times daily. 02/15/21   Lajean Saver, MD  docusate sodium (COLACE) 100 MG capsule Take 1 capsule (100 mg total) by mouth 2 (two) times daily. 09/29/16   Hongalgi, Lenis Dickinson, MD  doxycycline (VIBRAMYCIN) 100 MG capsule Take 1 capsule (100 mg total) by mouth 2 (two) times daily. Patient not  taking: No sig reported 02/01/21   Pearson Forster, NP  ferrous sulfate 325 (65 FE) MG tablet Take 1 tablet (325 mg total) by mouth 2 (two) times daily. 09/12/18   Irene Shipper, MD  latanoprost (XALATAN) 0.005 % ophthalmic solution Place 1 drop into both eyes at bedtime. 12/28/20   [provider]  losartan-hydrochlorothiazide (HYZAAR) 50-12.5 MG tablet Take 1 tablet by mouth at bedtime. 02/24/21   White, Leitha Schuller, NP  pantoprazole (PROTONIX) 40 MG tablet TAKE 1 TABLET BY MOUTH EVERY DAY Patient not taking: No sig reported 03/17/20   Irene Shipper, MD  rosuvastatin (CRESTOR) 10 MG tablet Take 10 mg by mouth at bedtime.    [provider]   sulfamethoxazole-trimethoprim (BACTRIM DS) 800-160 MG tablet Take 1 tablet by mouth 2 (two) times daily for 3 days. 02/24/21 02/27/21  Hans Eden, NP    Allergies    Patient has no known allergies.  Review of Systems   Review of Systems  Constitutional:  Positive for appetite change and fatigue. Negative for chills and fever.  HENT:  Negative for congestion.   Eyes:  Negative for visual disturbance.  Respiratory:  Negative for shortness of breath.   Cardiovascular:  Negative for chest pain.  Gastrointestinal:  Negative for abdominal pain, diarrhea and vomiting.  Genitourinary:  Positive for frequency.  Skin:  Negative for rash.  Neurological:  Negative for facial asymmetry, speech difficulty, weakness and headaches.  Psychiatric/Behavioral:  Positive for agitation and confusion.    Physical Exam Updated Vital Signs BP 124/81   Pulse 91   Temp 98.4 F (36.9 C) (Oral)   Resp 16   Ht 5\' 3"  (1.6 m)   SpO2 100%   BMI 27.99 kg/m   Physical Exam Vitals and nursing note reviewed.  Constitutional:      General: She is not in acute distress.    Appearance: Normal appearance. She is not ill-appearing.  HENT:     Head: Normocephalic.     Mouth/Throat:     Mouth: Mucous membranes are moist.  Eyes:     Pupils: Pupils are equal, round, and reactive to light.  Cardiovascular:     Rate and Rhythm: Normal rate.  Pulmonary:     Effort: Pulmonary effort is normal. No respiratory distress.  Abdominal:     Palpations: Abdomen is soft.     Tenderness: There is no abdominal tenderness. There is no guarding.  Skin:    General: Skin is warm.  Neurological:     General: No focal deficit present.     Mental Status: She is alert and oriented to person, place, and time. Mental status is at baseline.  Psychiatric:        Mood and Affect: Mood normal.        Behavior: Behavior normal.        Thought Content: Thought content normal.        Judgment: Judgment normal.    ED Results /  Procedures / Treatments   Labs (all labs ordered are listed, but only abnormal results are displayed) Labs Reviewed  URINALYSIS, ROUTINE W REFLEX MICROSCOPIC - Abnormal; Notable for the following components:      Result Value   Leukocytes,Ua SMALL (*)    Bacteria, UA RARE (*)    All other components within normal limits  COMPREHENSIVE METABOLIC PANEL - Abnormal; Notable for the following components:   Potassium 3.1 (*)    Glucose, Bld 103 (*)    Creatinine,  Ser 1.12 (*)    Total Protein 8.5 (*)    Total Bilirubin 1.3 (*)    GFR, Estimated 51 (*)    All other components within normal limits  URINE CULTURE  CBC WITH DIFFERENTIAL/PLATELET    EKG None  Radiology No results found.  Procedures Procedures   Medications Ordered in ED Medications  potassium chloride SA (KLOR-CON) CR tablet 40 mEq (has no administration in time range)  cefTRIAXone (ROCEPHIN) 1 g in sodium chloride 0.9 % 100 mL IVPB (has no administration in time range)    ED Course  I have reviewed the triage vital signs and the nursing notes.  Pertinent labs & imaging results that were available during my care of the patient were reviewed by me and considered in my medical decision making (see chart for details).    MDM Rules/Calculators/A&P                          75 year old female presents emergency department for evaluation of confusion.  Currently she has baseline and completely oriented.  Vitals are normal and stable.  Daughter expresses concern for confusion and difficulty taking her medication over the past couple days.  Patient was evaluated for this confusion 2 weeks ago, had a head CT and full work-up, diagnosed with a urinary tract infection that the patient did not appropriately take her antibiotics for.  3 days ago she came back with similar complaints, was found to have a positive urine culture with Proteus mirabilis, prescribed Bactrim which she has not taken as directed.  Presenting today with  ongoing intermittent episodes of confusion.  Blood work shows mild dehydration, chronic hypokalemia.  Urinalysis appears improved but patient is most likely still suffering from a UTI that has not been adequately treated with outpatient oral antibiotics. No sings of sepsis.  IV dose of antibiotics given here, I prolonged her prescription for the Bactrim and instructed her to take it every morning and night for the next 5 days.  The daughter has agreed to stay with the patient to ensure medication compliance.  With the patient being currently at her baseline mental status I do not appreciate any other acute process in regards to her confusion.  She is nonfocal on exam.  Plan for outpatient treatment of urinary tract infection and reevaluation.  Patient will be discharged and treated as an outpatient.  Discharge plan and strict return to ED precautions discussed, patient verbalizes understanding and agreement.  Final Clinical Impression(s) / ED Diagnoses Final diagnoses:  None    Rx / DC Orders ED Discharge Orders     None        Lorelle Gibbs, DO 02/26/21 2233

## 2021-02-26 NOTE — ED Provider Notes (Signed)
Emergency Medicine Provider Triage Evaluation Note  Meghan Stanley , a 75 y.o. female  was evaluated in triage.  Pt complains of confusion.  Review of Systems  Positive: Urinary frequency, emotional, confusion Negative: Fever, headache, cp, sob, abd pain, focal numbness or weakness  Physical Exam  BP (!) 168/82 (BP Location: Left Arm)   Pulse 98   Temp 98.4 F (36.9 C) (Oral)   Resp 16   Ht 5\' 3"  (1.6 m)   SpO2 100%   BMI 27.99 kg/m  Gen:   Awake, no distress   Resp:  Normal effort  MSK:   Moves extremities without difficulty  Other:  Is a bit tearful.  Is A&Ox4  Medical Decision Making  Medically screening exam initiated at 4:10 PM.  Appropriate orders placed.  Meghan Stanley was informed that the remainder of the evaluation will be completed by another provider, this initial triage assessment does not replace that evaluation, and the importance of remaining in the ED until their evaluation is complete.  Daughter noticed pt have been having intermittent bout of confusion, and paranoia x 2-3 weeks.  Initially diagnosed with UTI, was given abx several times but for the past few days she exhibit worsening confusion.  Appears more oriented today.  No hx of stroke or dementia.  No hx of polysubstance use.    Domenic Moras, PA-C 02/26/21 1612    Horton, Alvin Critchley, DO 02/26/21 2222

## 2021-02-26 NOTE — ED Notes (Signed)
Call received from pt daughter Kinsie Belford [phone] requesting rtn call from RN or MD re: admission request. Nancy Nordmann

## 2021-02-26 NOTE — Discharge Instructions (Addendum)
You have been seen and discharged from the emergency department.  I have refilled your Bactrim prescription, take this twice a day (once in the morning and once at night) for the next 5 days.  It is extremely important that you take your antibiotics for full resolution of UTI.  I believe that this is what is causing your confusion.  Your blood work showed mild dehydration, your potassium has been chronically low.  You were given replacement here in the department.  You are also given a dose of IV antibiotics.  Stay well-hydrated.  Follow-up with your primary provider for reevaluation and further care. Take home medications as prescribed. If you have any worsening symptoms or further concerns for your health please return to an emergency department for further evaluation.

## 2021-02-26 NOTE — ED Notes (Signed)
Patient's family called to pick up patient.

## 2021-02-28 LAB — URINE CULTURE

## 2021-03-01 ENCOUNTER — Other Ambulatory Visit: Payer: Self-pay

## 2021-03-01 ENCOUNTER — Emergency Department (HOSPITAL_COMMUNITY)
Admission: EM | Admit: 2021-03-01 | Discharge: 2021-03-02 | Disposition: A | Discharge status: Home / Self Care | Payer: Medicare Other | Attending: Emergency Medicine | Admitting: Emergency Medicine

## 2021-03-01 ENCOUNTER — Emergency Department (HOSPITAL_COMMUNITY): Payer: Medicare Other

## 2021-03-01 DIAGNOSIS — R4182 Altered mental status, unspecified: Secondary | ICD-10-CM | POA: Insufficient documentation

## 2021-03-01 DIAGNOSIS — Z20822 Contact with and (suspected) exposure to covid-19: Secondary | ICD-10-CM | POA: Diagnosis not present

## 2021-03-01 DIAGNOSIS — K219 Gastro-esophageal reflux disease without esophagitis: Secondary | ICD-10-CM | POA: Diagnosis not present

## 2021-03-01 DIAGNOSIS — F062 Psychotic disorder with delusions due to known physiological condition: Secondary | ICD-10-CM | POA: Diagnosis not present

## 2021-03-01 DIAGNOSIS — F22 Delusional disorders: Secondary | ICD-10-CM | POA: Insufficient documentation

## 2021-03-01 DIAGNOSIS — R109 Unspecified abdominal pain: Secondary | ICD-10-CM | POA: Insufficient documentation

## 2021-03-01 DIAGNOSIS — Z85038 Personal history of other malignant neoplasm of large intestine: Secondary | ICD-10-CM | POA: Insufficient documentation

## 2021-03-01 DIAGNOSIS — M549 Dorsalgia, unspecified: Secondary | ICD-10-CM | POA: Diagnosis not present

## 2021-03-01 DIAGNOSIS — N19 Unspecified kidney failure: Secondary | ICD-10-CM

## 2021-03-01 LAB — CBC WITH DIFFERENTIAL/PLATELET
Abs Immature Granulocytes: 0.02 10*3/uL (ref 0.00–0.07)
Basophils Absolute: 0.1 10*3/uL (ref 0.0–0.1)
Basophils Relative: 1 %
Eosinophils Absolute: 0.1 10*3/uL (ref 0.0–0.5)
Eosinophils Relative: 1 %
HCT: 39.5 % (ref 36.0–46.0)
Hemoglobin: 12.7 g/dL (ref 12.0–15.0)
Immature Granulocytes: 0 %
Lymphocytes Relative: 15 %
Lymphs Abs: 1.4 10*3/uL (ref 0.7–4.0)
MCH: 26.6 pg (ref 26.0–34.0)
MCHC: 32.2 g/dL (ref 30.0–36.0)
MCV: 82.6 fL (ref 80.0–100.0)
Monocytes Absolute: 0.8 10*3/uL (ref 0.1–1.0)
Monocytes Relative: 9 %
Neutro Abs: 7.1 10*3/uL (ref 1.7–7.7)
Neutrophils Relative %: 74 %
Platelets: 264 10*3/uL (ref 150–400)
RBC: 4.78 MIL/uL (ref 3.87–5.11)
RDW: 15.2 % (ref 11.5–15.5)
WBC: 9.5 10*3/uL (ref 4.0–10.5)
nRBC: 0 % (ref 0.0–0.2)

## 2021-03-01 LAB — COMPREHENSIVE METABOLIC PANEL
ALT: 14 U/L (ref 0–44)
AST: 20 U/L (ref 15–41)
Albumin: 4.3 g/dL (ref 3.5–5.0)
Alkaline Phosphatase: 63 U/L (ref 38–126)
Anion gap: 15 (ref 5–15)
BUN: 23 mg/dL (ref 8–23)
CO2: 21 mmol/L — ABNORMAL LOW (ref 22–32)
Calcium: 9.8 mg/dL (ref 8.9–10.3)
Chloride: 97 mmol/L — ABNORMAL LOW (ref 98–111)
Creatinine, Ser: 1.6 mg/dL — ABNORMAL HIGH (ref 0.44–1.00)
GFR, Estimated: 33 mL/min — ABNORMAL LOW (ref >60)
Glucose, Bld: 99 mg/dL (ref 70–99)
Potassium: 3.3 mmol/L — ABNORMAL LOW (ref 3.5–5.1)
Sodium: 133 mmol/L — ABNORMAL LOW (ref 135–145)
Total Bilirubin: 0.9 mg/dL (ref 0.3–1.2)
Total Protein: 7.8 g/dL (ref 6.5–8.1)

## 2021-03-01 LAB — RESP PANEL BY RT-PCR (FLU A&B, COVID) ARPGX2
Influenza A by PCR: NEGATIVE
Influenza B by PCR: NEGATIVE
SARS Coronavirus 2 by RT PCR: NEGATIVE

## 2021-03-01 LAB — URINALYSIS, ROUTINE W REFLEX MICROSCOPIC
Bilirubin Urine: NEGATIVE
Glucose, UA: NEGATIVE mg/dL
Hgb urine dipstick: NEGATIVE
Ketones, ur: 5 mg/dL — AB
Leukocytes,Ua: NEGATIVE
Nitrite: NEGATIVE
Protein, ur: NEGATIVE mg/dL
Specific Gravity, Urine: 1.011 (ref 1.005–1.030)
pH: 6 (ref 5.0–8.0)

## 2021-03-01 LAB — RAPID URINE DRUG SCREEN, HOSP PERFORMED
Amphetamines: NOT DETECTED
Barbiturates: NOT DETECTED
Benzodiazepines: NOT DETECTED
Cocaine: NOT DETECTED
Opiates: NOT DETECTED
Tetrahydrocannabinol: NOT DETECTED

## 2021-03-01 MED ORDER — LORAZEPAM 1 MG PO TABS
1.0000 mg | ORAL_TABLET | Freq: Once | ORAL | Status: AC
  Administered 2021-03-01: 1 mg via ORAL
  Filled 2021-03-01: qty 1

## 2021-03-01 MED ORDER — ALUM & MAG HYDROXIDE-SIMETH 200-200-20 MG/5ML PO SUSP: 30.0000 mL | Freq: Four times a day (QID) | ORAL | Status: DC | PRN

## 2021-03-01 MED ORDER — SODIUM CHLORIDE 0.9 % IV BOLUS
500.0000 mL | Freq: Once | INTRAVENOUS | Status: AC
  Administered 2021-03-01: 500 mL via INTRAVENOUS

## 2021-03-01 MED ORDER — ONDANSETRON HCL 4 MG PO TABS: 4.0000 mg | ORAL_TABLET | Freq: Three times a day (TID) | ORAL | Status: DC | PRN

## 2021-03-01 MED ORDER — ACETAMINOPHEN 325 MG PO TABS: 650.0000 mg | ORAL_TABLET | ORAL | Status: DC | PRN

## 2021-03-01 NOTE — ED Notes (Signed)
TTS assessment at bedside

## 2021-03-01 NOTE — ED Notes (Signed)
Pt belongings obtained and pt placed in burgundy scrubs. Belongings obtained include a red purse, pants, bra, back brace, bra, and shirt. Belongings placed in cabinet in 1-8.

## 2021-03-01 NOTE — ED Notes (Signed)
TTS CART AT BEDSIDE
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
## 2021-03-01 NOTE — BH Specialist Note (Signed)
Attempted to see Pt at 1540, advised by attending RN that cart was not operational.

## 2021-03-01 NOTE — ED Triage Notes (Addendum)
Pt brought in by EMS from home c/o AMS. Family reports increase confusion for the past few days. Pt recently diagnose with UTI and taking oral ABT. Pt denies N/V/D. Pt denies fever. PT was seen 3 days ago with same complain. Pt a/ox2.

## 2021-03-01 NOTE — BH Assessment (Addendum)
Comprehensive Clinical Assessment (CCA) Note  03/01/2021 Meghan Stanley [id number]  Discharge Disposition: Meghan Reichert, NP, reviewed pt's chart and information and determined pt meets criteria for inpatient geri psych. Pt's referral information will be faxed out to multiple hospitals by SW for potential placement. This information was relayed to pt's providers at 2016.  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 Acute risk factors for suicide include: family or marital conflict. Protective factors for this patient include: positive social support, responsibility to others (children, family), and life satisfaction. Considering these factors, the overall suicide risk at this point appears to be none. Patient is not appropriate for outpatient follow up.  Therefore, no sitter is recommended at this time for suicide precautions.  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 Pt's family reports pt has been increasingly confused over the past several days; she was dx with a UTA and has been taking oral antibiotics that she has not yet completed. Pt was reportedly seen 3 days ago with the same concerns. When asked why she is in the hospital, pt states, "I've been told that I've been having hallucinations. I've been  under a lot of stress - I think that's part of it. I wasn't really getting enough sleep - maybe 4-5 hours/night. I've been taking care of my mom."  Pt denies SI, hospitalizations for mental health concerns, or a plan to kill herself. She states she experienced SI as a teenager but not since that time. Pt denies HI, NSSIB, access to guns/weapons, engagement with the legal system, and SA.  Pt was able to answer most questions posed without complications; when asked if she was on disability, retired, or working, pt paused for a moment and answered she is on disability. When she was asked if clinician could make contact with her spouse, pt paused and stated that no, clinician could not, as she was afraid he was going to say something bad. Clinician inquired about contacting her daughter but pt denied this as well, stating she wasn't sure what she was thinking (about her daughter trying to steal her money) was accurate or not. Pt provided the names and phone numbers of her sisters for collateral, but pt stated she has not been talking to her, as they don't answer her calls, and that they were not aware she has been in the hospital.  Pt is oriented x3 (person, place, situation). Pt's memory/recall was good. Pt was cooperative throughout the assessment process. Pt's insight, judgement, and impulse control is fair - poor at this time.   Patient Reported Information How did you hear about [place]? Other (Comment) (EDP)  What Is the Reason for Your Visit/Call Today? Pt states she has been told she's experiencing hallucinations; she states she's been under a lot of stress and that she hasn't been getting enough sleep.  How Long  Has This Been Causing You Problems? 1 wk - 1 month  What Do You Feel Would Help You the Most Today? -- (Pt is unsure)   Have You Recently Had Any Thoughts About Hurting Yourself? No  Are You Planning to Commit Suicide/Harm Yourself At This time? No   Have you Recently Had Thoughts  About [place]? No  Are You Planning to Harm Someone at This Time? No  Explanation: No data recorded  Have You Used Any Alcohol or Drugs in the Past 24 Hours? No  How Long Ago Did You Use Drugs or Alcohol? No data recorded What Did You Use and How Much? No data recorded  Do You Currently Have a Therapist/Psychiatrist? No  Name of Therapist/Psychiatrist: No data recorded  Have You Been Recently Discharged From Any Office Practice or Programs? No  Explanation of Discharge From Practice/Program: No data recorded    CCA Screening Triage Referral Assessment Type of Contact: Tele-Assessment  Telemedicine Service Delivery: Telemedicine service delivery: This service was provided via telemedicine using a 2-way, interactive audio and video technology  Is this Initial or Reassessment? Initial Assessment  Date Telepsych consult ordered in [facility]:  03/01/21  Time Telepsych consult ordered in [facility]:  0518  Location of Assessment: [facility] ED  Provider Location: [facility] Assessment Services   Collateral Involvement: Pt provided the names and numbers for her 2 sisters but states she has not been talking to them/they won't answer her calls and they do not realize she is in the hospital.   Does Patient Have a [place]? No data recorded Name and Contact of Legal Guardian: No data recorded If Minor and Not Living with Parent(s), Who has Custody? N/A  Is CPS involved or ever been involved? Never  Is APS involved or ever been involved? Never   Patient Determined To Be At Risk for Harm To Self or Others Based on Review of Patient Reported Information or Presenting Complaint? No  Method: No data recorded Availability of Means: No data recorded Intent: No data recorded Notification Required: No data recorded Additional Information for Danger to Others Potential: No data recorded Additional Comments for Danger to Others Potential: No data recorded Are There Guns or  Other Weapons in Your Home? No data recorded Types of Guns/Weapons: No data recorded Are These Weapons Safely Secured?                            No data recorded Who Could Verify You Are Able To Have These Secured: No data recorded Do You Have any Outstanding Charges, Pending Court Dates, Parole/Probation? No data recorded Contacted To Inform of Risk of Harm To Self or Others: -- (N/A)    Does Patient Present under Involuntary Commitment? No  IVC Papers Initial File Date: No data recorded  [place] of Residence: [place]   Patient Currently Receiving the Following Services: Not Receiving Services   Determination of Need: Emergent (2 hours)   Options For Referral: Inpatient Hospitalization     CCA Biopsychosocial Patient Reported Schizophrenia/Schizoaffective Diagnosis in Past: No   Strengths: Pt shares she lives at home with her spouse and helps to take care of her mother.   Mental Health Symptoms Depression:   None   Duration of Depressive symptoms:    Mania:   None   Anxiety:    None   Psychosis:   Hallucinations; Delusions   Duration of Psychotic symptoms:  Duration of Psychotic  Symptoms: Less than six months   Trauma:   None   Obsessions:   None   Compulsions:   None   Inattention:   None   Hyperactivity/Impulsivity:   None   Oppositional/Defiant Behaviors:   None   Emotional Irregularity:   None   Other Mood/Personality Symptoms:   None noted    Mental Status Exam Appearance and self-care  Stature:   Average   Weight:   Average weight   Clothing:   -- (Pt is dressed in scrubs)   Grooming:   Normal   Cosmetic use:   None   Posture/gait:   Normal   Motor activity:   Not Remarkable   Sensorium  Attention:   Normal   Concentration:   Normal   Orientation:   Person; Place; Situation   Recall/memory:   Normal   Affect and Mood  Affect:   Appropriate   Mood:   Other (Comment) (Calm)   Relating  Eye  contact:   Normal   Facial expression:   Responsive   Attitude toward examiner:   Cooperative   Thought and Language  Speech flow:  Clear and Coherent   Thought content:   Appropriate to Mood and Circumstances   Preoccupation:   None   Hallucinations:   Auditory; Visual   Organization:  No data recorded  [employer] of Knowledge:   Average   Intelligence:   Average   Abstraction:   Functional   Judgement:   Fair   Reality Testing:   Distorted   Insight:   Fair   Decision Making:   Confused   Social Functioning  Social Maturity:   Impulsive   Social Judgement:   Naive   Stress  Stressors:   Family conflict; Illness; Relationship   Coping Ability:   Overwhelmed; Exhausted   Skill Deficits:   Self-control   Supports:   Family     Religion: Religion/Spirituality Are You A Religious Person?:  (Not assessed) How Might This Affect Treatment?: Not assessed  Leisure/Recreation: Leisure / Recreation Do You Have Hobbies?:  (Not assessed)  Exercise/Diet: Exercise/Diet Do You Exercise?:  (Not assessed) Have You Gained or Lost A Significant Amount of Weight in the Past Six Months?:  (Not assessed) Do You Follow a Special Diet?:  (Not assessed) Do You Have Any Trouble Sleeping?:  (Not assessed)   CCA Employment/Education Employment/Work Situation: Employment / Work Situation Employment Situation: On disability Why is Patient on Disability: Not assessed - pt took a moment to answer whether she was employed, retired, or on disability. How Long has Patient Been on Disability: Not assessed - pt took a moment to answer whether she was employed, retired, or on disability. Patient's Job has Been Impacted by Current Illness:  (N/A) Has Patient ever Been in the [employer]?:  (Not assessed)  Education: Education Is Patient Currently Attending School?: No Last Grade Completed:  (Not assessed) Did You Attend College?:  (Not  assessed) Did You Have An Individualized Education Program (IIEP):  (Not assessed) Did You Have Any Difficulty At School?:  (Not assessed) Patient's Education Has Been Impacted by Current Illness:  (Not assessed)   CCA Family/Childhood History Family and Relationship History: Family history Marital status: Married Number of Years Married:  (Not assessed) What types of issues is patient dealing with in the relationship?: Pt shares her husband does not understand her at times. Pt's daughter shares pt believes she and pt's father are trying to steal her money. Additional relationship  information: None noted Does patient have children?: Yes How many children?: 2 How is patient's relationship with their children?: Pt only mentioned one daughter and son; she may have more children. Pt's daughter reports pt believes she and pt's spouse are trying to steal her money.  Childhood History:  Childhood History By whom was/is the patient raised?:  (Not assessed) Did patient suffer any verbal/emotional/physical/sexual abuse as a child?:  (Not assessed) Did patient suffer from severe childhood neglect?:  (Not assessed) Has patient ever been sexually abused/assaulted/raped as an adolescent or adult?:  (Not assessed) Was the patient ever a victim of a crime or a disaster?:  (Not assessed) Witnessed domestic violence?:  (Not assessed) Has patient been affected by domestic violence as an adult?:  (Not assessed)  Child/Adolescent Assessment:     CCA Substance Use Alcohol/Drug Use: Alcohol / Drug Use Pain Medications: See MAR Prescriptions: See MAR Over the Counter: See MAR History of alcohol / drug use?: No history of alcohol / drug abuse Longest period of sobriety (when/how long): N/A Negative Consequences of Use:  (N/A) Withdrawal Symptoms:  (N/A)                         ASAM's:  Six Dimensions of Multidimensional Assessment  Dimension 1:  Acute Intoxication and/or Withdrawal  Potential:      Dimension 2:  Biomedical Conditions and Complications:      Dimension 3:  Emotional, Behavioral, or Cognitive Conditions and Complications:     Dimension 4:  Readiness to Change:     Dimension 5:  Relapse, Continued use, or Continued Problem Potential:     Dimension 6:  Recovery/Living Environment:     ASAM Severity Score:    ASAM Recommended Level of Treatment: ASAM Recommended Level of Treatment:  (N/A)   Substance use Disorder (SUD) Substance Use Disorder (SUD)  Checklist Symptoms of Substance Use:  (N/A)  Recommendations for Services/Supports/Treatments: Recommendations for Services/Supports/Treatments Recommendations For Services/Supports/Treatments: Other (Comment) (Geri-psych inpatient hospitalization)  Discharge Disposition: Meghan Reichert, NP, reviewed pt's chart and information and determined pt meets criteria for inpatient geri psych. Pt's referral information will be faxed out to multiple hospitals by SW for potential placement. This information was relayed to pt's providers at 2016.  DSM5 Diagnoses: Patient Active Problem List   Diagnosis Date Noted   Iron deficiency anemia due to chronic blood loss    Esophageal stricture    Hiatal hernia    Benign neoplasm of ascending colon    Benign neoplasm of descending colon    Symptomatic anemia 09/27/2016   Thrombocytosis 09/27/2016   Hyperglycemia 09/27/2016   Scoliosis 09/27/2016     Referrals to Alternative Service(s): Referred to Alternative Service(s):   Place:   Date:   Time:    Referred to Alternative Service(s):   Place:   Date:   Time:    Referred to Alternative Service(s):   Place:   Date:   Time:    Referred to Alternative Service(s):   Place:   Date:   Time:     Meghan Burn, LMFT

## 2021-03-01 NOTE — ED Notes (Signed)
Attempted to contact TTS regarding pts assessment, but no answer by staff.

## 2021-03-02 ENCOUNTER — Emergency Department (HOSPITAL_COMMUNITY): Payer: Medicare Other

## 2021-03-02 LAB — BASIC METABOLIC PANEL
Anion gap: 8 (ref 5–15)
BUN: 13 mg/dL (ref 8–23)
CO2: 25 mmol/L (ref 22–32)
Calcium: 9.7 mg/dL (ref 8.9–10.3)
Chloride: 101 mmol/L (ref 98–111)
Creatinine, Ser: 1.01 mg/dL — ABNORMAL HIGH (ref 0.44–1.00)
GFR, Estimated: 58 mL/min — ABNORMAL LOW (ref >60)
Glucose, Bld: 116 mg/dL — ABNORMAL HIGH (ref 70–99)
Potassium: 4.1 mmol/L (ref 3.5–5.1)
Sodium: 134 mmol/L — ABNORMAL LOW (ref 135–145)

## 2021-03-02 LAB — URINE CULTURE: Culture: <10000 — AB

## 2021-03-02 MED ORDER — LATANOPROST 0.005 % OP SOLN
1.0000 [drp] | Freq: Every day | OPHTHALMIC | Status: DC
  Filled 2021-03-02: qty 2.5

## 2021-03-02 MED ORDER — LOSARTAN POTASSIUM 25 MG PO TABS: 50.0000 mg | ORAL_TABLET | Freq: Every day | ORAL | Status: DC

## 2021-03-02 MED ORDER — AMLODIPINE BESYLATE 5 MG PO TABS: 10.0000 mg | ORAL_TABLET | Freq: Every day | ORAL | Status: DC

## 2021-03-02 MED ORDER — FERROUS SULFATE 325 (65 FE) MG PO TABS: 325.0000 mg | ORAL_TABLET | Freq: Two times a day (BID) | ORAL | Status: DC

## 2021-03-02 MED ORDER — ACETAMINOPHEN 500 MG PO TABS: 1000.0000 mg | ORAL_TABLET | ORAL | Status: DC | PRN

## 2021-03-02 MED ORDER — HYDROCHLOROTHIAZIDE 12.5 MG PO CAPS: 12.5000 mg | ORAL_CAPSULE | Freq: Every day | ORAL | Status: DC

## 2021-03-02 MED ORDER — ACETAMINOPHEN 500 MG PO TABS: 1000.0000 mg | ORAL_TABLET | Freq: Three times a day (TID) | ORAL | Status: DC | PRN

## 2021-03-02 MED ORDER — ROSUVASTATIN CALCIUM 10 MG PO TABS: 10.0000 mg | ORAL_TABLET | Freq: Every day | ORAL | Status: DC

## 2021-03-02 MED ORDER — LOSARTAN POTASSIUM-HCTZ 50-12.5 MG PO TABS: 1.0000 | ORAL_TABLET | Freq: Every day | ORAL | Status: DC

## 2021-03-02 MED ORDER — SODIUM CHLORIDE 0.9 % IV BOLUS
500.0000 mL | Freq: Once | INTRAVENOUS | Status: AC
  Administered 2021-03-02: 500 mL via INTRAVENOUS

## 2021-03-02 NOTE — ED Notes (Signed)
[name], daughter, has called multiple times wanted an update on her mother, pt previously refusing to give information to daughter or husband. Told pt [name] was calling for an update, pt requested that she call and talk to [place], called and gave the phone to pt so she could update her.

## 2021-03-02 NOTE — Progress Notes (Addendum)
.  Transition of Care [facility]) - Emergency Department Mini Assessment   Patient Details  Name: Meghan Stanley MRN: [id number] Date of Birth: 1946-03-29  Transition of Care [facility]) CM/SW Contact:    Erenest Rasher, RN Phone Number: [phone] 03/02/2021, 2:37 PM   Clinical Narrative: TOC CM spoke pt's dtr, Spero Geralds. States pt lives with husband, will be living with family until they can set long term care. Pt will be staying with sister temp. States pt has been refusing to go to appts to see PCP. Pt PCP, Dr Osborne Casco. Sister arranged transportation, family will pick her up today.    ED Mini Assessment: What brought you to the Emergency Department? : AMS  Barriers to Discharge: No Barriers Identified  Barrier interventions: contacted daughter  Means of departure: Car  Interventions which prevented an admission or readmission: Follow-up medical appointment    Patient Contact and Communications Key Contact 1: [place] with: daughter Contact Date: 03/02/21,   Contact time: 1431 Contact Phone Number: [phone] Call outcome: states she will set up a meeting with family to discuss long term plans   Admission diagnosis:  UTI and Dementia Patient Active Problem List   Diagnosis Date Noted   Iron deficiency anemia due to chronic blood loss    Esophageal stricture    Hiatal hernia    Benign neoplasm of ascending colon    Benign neoplasm of descending colon    Symptomatic anemia 09/27/2016   Thrombocytosis 09/27/2016   Hyperglycemia 09/27/2016   Scoliosis 09/27/2016   PCP:  Haywood Pao, MD Pharmacy:   [id number] - [place], [place] [address]  [place] [address] Phone: [phone] Fax: [phone]

## 2021-03-02 NOTE — ED Notes (Signed)
Per Quintella Reichert, NP - pt meets criteria for inpatient geri psych. Pt's referral information will be faxed out to multiple hospitals by SW for potential placement.

## 2021-03-02 NOTE — BH Assessment (Signed)
[place] Assessment Progress Note   Per Pecolia Ades, NP, this pt does not require psychiatric hospitalization at this time.  Pt presents under IVC initiated by EDP Joseph Berkshire, MD which has been rescinded by Hampton Abbot, MD.  Pt is psychiatrically cleared.  Discharge instructions include referrals for area psychiatrists specializing in geriatric care.  A TOC consult has been ordered to address pt's psychosocial needs.  EDP Lorre Munroe, NP and pt's nurse, [place], have been notified, along with the [facility] team.  Jalene Mullet, [place] Triage Specialist [phone]

## 2021-03-02 NOTE — ED Provider Notes (Addendum)
Emergency Medicine Observation Re-evaluation Note  Meghan Stanley is a 75 y.o. female, seen on rounds today.  Pt initially presented to the ED for complaints of AMS Currently, the patient is lying in bed drinking water. States she feels better.  Physical Exam  BP 125/78 (BP Location: Right Arm)   Pulse 83   Temp 97.9 F (36.6 C) (Oral)   Resp 18   Ht 5\' 3"  (1.6 m)   SpO2 99%   BMI 27.99 kg/m  Physical Exam General: nad Lungs: no distress Psych: cooperative, calm  ED Course / MDM  EKG:EKG Interpretation  Date/Time:  Sunday March 01 2021 05:35:01 EDT Ventricular Rate:  75 PR Interval:  225 QRS Duration: 71 QT Interval:  377 QTC Calculation: 421 R Axis:   -37 Text Interpretation: Sinus rhythm Prolonged PR interval Inferior infarct, old Anterior infarct, old Confirmed by Orpah Greek [phone]) on 03/01/2021 5:45:33 AM  I have reviewed the labs performed to date as well as medications administered while in observation.  Recent changes in the last 24 hours include none.  Ms. had had a significant drop of her GFR upon lab check yesterday.  She is taking oral fluids.  I have ordered a 500 cc bolus and a repeat BMP.  She is not incredibly uremic, but she has not had a history of chronic kidney disease.  Renal ultrasound will also be ordered.  Plan  Current plan is for inpatient placement. Patient is under full IVC at this time.   Arnaldo Natal, MD 03/02/21 [phone]  GFR improved, and renal u/s normal.   Arnaldo Natal, MD 03/02/21 1232  Patient is now cleared from a psychiatric standpoint but refuses to give our psychiatric nurse practitioner permission to talk to her husband or daughter.  They are unable to reach her sister.  They have placed a TOC consult.   Arnaldo Natal, MD 03/02/21 1307   I spoke with the patient, and she states that she is comfortable going home to her mother's house.  She needs some assistance setting up a primary care appointment as her  insurance company was supposed to be reaching out to her.  TOC consult placed for assistance.  She will be discharged home.   Arnaldo Natal, MD 03/02/21 1355

## 2021-03-02 NOTE — ED Notes (Signed)
Called sister, Peter Congo at [phone], she stated she will send someone to pick up pt, knows pt does not want to go back with her husband.

## 2021-03-02 NOTE — ED Notes (Signed)
Pt is sleeping and hard to redirect back to bed once up. Will try to get vitals once pt wakes up. Per RN

## 2021-03-02 NOTE — ED Notes (Signed)
Pt refused vitals 

## 2021-03-02 NOTE — ED Notes (Signed)
Husband would like an update on his wife, [phone], Meghan Stanley, husband said very important.
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
## 2021-03-02 NOTE — Discharge Instructions (Addendum)
For your behavioral health needs you are advised to follow up with a psychiatrist specializing in providing care for older adults.  If you do not currently see a psychiatrist, contact one of the providers listed below at your earliest opportunity to schedule an intake appointment:       Norma Fredrickson, MD      [facility]      [address]      [place], [place] [address]      [phone]        Leanord Hawking, MD      [facility]      [address] [place]      [place], [place] [address]      [phone]

## 2021-03-03 ENCOUNTER — Ambulatory Visit (HOSPITAL_COMMUNITY)
Admission: EM | Admit: 2021-03-03 | Discharge: 2021-03-03 | Disposition: A | Discharge status: Home / Self Care | Payer: Medicare Other | Attending: Psychiatry | Admitting: Psychiatry

## 2021-03-03 ENCOUNTER — Other Ambulatory Visit: Payer: Self-pay

## 2021-03-03 DIAGNOSIS — R4182 Altered mental status, unspecified: Secondary | ICD-10-CM | POA: Diagnosis present

## 2021-03-03 DIAGNOSIS — F22 Delusional disorders: Secondary | ICD-10-CM | POA: Diagnosis not present

## 2021-03-03 DIAGNOSIS — Z79899 Other long term (current) drug therapy: Secondary | ICD-10-CM | POA: Diagnosis not present

## 2021-03-03 DIAGNOSIS — F32A Depression, unspecified: Secondary | ICD-10-CM | POA: Diagnosis not present

## 2021-03-03 MED ORDER — QUETIAPINE FUMARATE 25 MG PO TABS
12.5000 mg | ORAL_TABLET | Freq: Two times a day (BID) | ORAL | Status: DC
  Filled 2021-03-03: qty 7

## 2021-03-03 MED ORDER — QUETIAPINE FUMARATE 25 MG PO TABS: 12.5000 mg | ORAL_TABLET | Freq: Two times a day (BID) | ORAL | 0 refills | Status: DC

## 2021-03-03 MED ORDER — QUETIAPINE FUMARATE 25 MG PO TABS
12.5000 mg | ORAL_TABLET | Freq: Once | ORAL | Status: AC
  Administered 2021-03-03: 12.5 mg via ORAL

## 2021-03-03 MED ORDER — QUETIAPINE FUMARATE 25 MG PO TABS
ORAL_TABLET | ORAL | Status: AC
  Filled 2021-03-03: qty 1

## 2021-03-03 NOTE — [facility] Counselor (Signed)
CSW provided referrals for psychiatric services in [facility] for pt.   Viha Kriegel [place], MSW, LCSW-A 6/28/20221:32 PM
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
## 2021-03-03 NOTE — Discharge Instructions (Signed)
Follow up with Dr. Osborne Casco for appt within the next 7 days.-discuss neurology consult  Follow up out patient resources for Psychiatrist.    Patient is instructed prior to discharge to: Take all medications as prescribed by his/her mental healthcare provider. Report any adverse effects and or reactions from the medicines to his/her outpatient provider promptly. Patient has been instructed & cautioned: To not engage in alcohol and or illegal drug use while on prescription medicines. In the event of worsening symptoms, patient is instructed to call the crisis hotline, 911 and or go to the nearest ED for appropriate evaluation and treatment of symptoms. To follow-up with his/her primary care provider for your other medical issues, concerns and or health care needs.

## 2021-03-03 NOTE — Discharge Summary (Signed)
Meghan Stanley to be D/C'd Home per NP order. Discussed with the patient/daughter and all questions fully answered. An After Visit Summary was printed and given to the patient. Patient escorted out and D/C home via private auto.  [name]  03/03/2021 1:50 PM
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
## 2021-03-06 LAB — CULTURE, BLOOD (ROUTINE X 2)
Culture: NO GROWTH
Culture: NO GROWTH
Special Requests: ADEQUATE
Special Requests: ADEQUATE

## 2021-03-09 ENCOUNTER — Encounter: Payer: Self-pay | Admitting: *Deleted

## 2021-03-10 ENCOUNTER — Ambulatory Visit (INDEPENDENT_AMBULATORY_CARE_PROVIDER_SITE_OTHER): Payer: Medicare Other | Admitting: Family

## 2021-03-10 ENCOUNTER — Encounter: Payer: Self-pay | Admitting: Family

## 2021-03-10 ENCOUNTER — Other Ambulatory Visit: Payer: Self-pay

## 2021-03-10 VITALS — BP 130/98 | HR 94 | Temp 97.1°F | Resp 16 | Ht 63.0 in | Wt 153.2 lb

## 2021-03-10 DIAGNOSIS — F22 Delusional disorders: Secondary | ICD-10-CM

## 2021-03-10 DIAGNOSIS — F5105 Insomnia due to other mental disorder: Secondary | ICD-10-CM

## 2021-03-10 DIAGNOSIS — H6122 Impacted cerumen, left ear: Secondary | ICD-10-CM

## 2021-03-10 DIAGNOSIS — E538 Deficiency of other specified B group vitamins: Secondary | ICD-10-CM | POA: Diagnosis not present

## 2021-03-10 DIAGNOSIS — Z7689 Persons encountering health services in other specified circumstances: Secondary | ICD-10-CM | POA: Diagnosis not present

## 2021-03-10 DIAGNOSIS — Z1159 Encounter for screening for other viral diseases: Secondary | ICD-10-CM | POA: Diagnosis not present

## 2021-03-10 DIAGNOSIS — R350 Frequency of micturition: Secondary | ICD-10-CM | POA: Diagnosis not present

## 2021-03-10 DIAGNOSIS — L602 Onychogryphosis: Secondary | ICD-10-CM

## 2021-03-10 DIAGNOSIS — E663 Overweight: Secondary | ICD-10-CM

## 2021-03-10 DIAGNOSIS — F99 Mental disorder, not otherwise specified: Secondary | ICD-10-CM

## 2021-03-10 DIAGNOSIS — E611 Iron deficiency: Secondary | ICD-10-CM

## 2021-03-10 DIAGNOSIS — E559 Vitamin D deficiency, unspecified: Secondary | ICD-10-CM

## 2021-03-10 DIAGNOSIS — I10 Essential (primary) hypertension: Secondary | ICD-10-CM | POA: Diagnosis not present

## 2021-03-10 DIAGNOSIS — F411 Generalized anxiety disorder: Secondary | ICD-10-CM

## 2021-03-10 DIAGNOSIS — Z6827 Body mass index (BMI) 27.0-27.9, adult: Secondary | ICD-10-CM

## 2021-03-10 LAB — POCT URINALYSIS DIPSTICK
Bilirubin, UA: NEGATIVE
Blood, UA: NEGATIVE
Glucose, UA: NEGATIVE
Ketones, UA: POSITIVE
Nitrite, UA: NEGATIVE
Protein, UA: NEGATIVE
Spec Grav, UA: 1.015 (ref 1.010–1.025)
Urobilinogen, UA: NEGATIVE E.U./dL — AB
pH, UA: 6 (ref 5.0–8.0)

## 2021-03-10 MED ORDER — SERTRALINE HCL 25 MG PO TABS: 25.0000 mg | ORAL_TABLET | Freq: Every day | ORAL | 0 refills | Status: DC

## 2021-03-10 MED ORDER — CLONIDINE HCL 0.1 MG PO TABS
0.1000 mg | ORAL_TABLET | Freq: Once | ORAL | Status: AC
  Administered 2021-03-10: 0.1 mg via ORAL

## 2021-03-10 NOTE — Progress Notes (Addendum)
Provider: Marlowe Sax FNP-C   Angel Hobdy, Nelda Bucks, NP  Patient Care Team: Haliegh Khurana, Nelda Bucks, NP as PCP - General (Family Medicine)  Extended Emergency Contact Information Primary Emergency Contact: Burnetta Sab[place] Mobile Phone: [phone] Relation: Daughter Secondary Emergency Contact: Kipnis,Edward  [place] Phone: [phone] Relation: Spouse  Code Status:  Full Code  Goals of care: Advanced Directive information Advanced Directives 03/10/2021  Does Patient Have a Medical Advance Directive? No  Would patient like information on creating a medical advance directive? No - Patient declined     Chief Complaint  Patient presents with   Establish Care    New Patient.     HPI:  Pt is a 75 y.o. 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  Has had multiple visit to ED in June,20222 for acute cystitis and Altered mental status and paranoid behavior.she was seen at [facility] for AMS Seroquel 12.5 mg tablet daily was prescribed.Daughter states stopped giving her Seroquel did not like the way it made the patient feel " drowsy and loopy".Patient states did like it being in that states of feeling loopy. She continues to be paranoid.thinks someone stole her money in the bank so worried that the situation might end up in court.Also paranoid about her husband stealing her money.Has been living with her daughter and sometimes with her sister instead of the Husband. Admits to feeling anxious states: " It's about disconnect like fixing  the puzzles".  Daughter states worries sometimes.crys sometimes. Daughter  thinks she depressed.  No hx of depression but took valium during her teenage or in her 70's. Does not recall why she was on Valium.  Has had Auditory hallucination but not lately.   Follows up with Dr.Bowen need eye check.   Hypertension - B/p elevated today.Did not take her medication.did not take her medication since she came from the hospital thought they said not to take.she checks her B/p at home but does not record.   Insomnia - daughter states possible from worrying too much cannot sleep.  Hx of GERD - states had stretching of Esophagus in the past which helped symptoms.she was taking Protonix too. Denies any difficulties swallowing.   She is due for PNA vac,Shingrix ,Tdap vaccine and Dexa scan but states it's too much for her right now since she has to go to too many doctors everything is overwhelming her.worried about having enough money in the bank or it's been stolen. Will post pone until next visit.   Agrees to come back for fasting blood work.  Past Medical History:  Diagnosis Date   Anemia    Arthritis    Colon polyps    GERD (gastroesophageal reflux disease)    Hemorrhoids    Hiatal hernia    HLD (hyperlipidemia)    Hypertension    Scoliosis    Past Surgical History:  Procedure Laterality Date   COLONOSCOPY N/A 09/29/2016   Procedure: COLONOSCOPY;  Surgeon: Irene Shipper, MD;  Location: [facility];  Service: Endoscopy;  Laterality: N/A;   ESOPHAGOGASTRODUODENOSCOPY N/A 09/29/2016   Procedure: ESOPHAGOGASTRODUODENOSCOPY (EGD);  Surgeon: Irene Shipper, MD;  Location: [facility];  Service: Endoscopy;  Laterality: N/A;   NO PAST SURGERIES      No Known Allergies  Allergies as of 03/10/2021   No Known Allergies      Medication List        Accurate as of March 10, 2021 11:59 PM. If you have any questions, ask your nurse or doctor.          STOP taking these medications    pantoprazole 40 MG tablet Commonly known as: PROTONIX Stopped by: Otis Peak, CMA   QUEtiapine 25 MG tablet Commonly known as: SEROQUEL Stopped by: Sandrea Hughs, NP       TAKE these medications    acetaminophen 500 MG tablet Commonly known as: TYLENOL Take 1,000 mg by mouth as needed. What changed: Another medication with the same name was removed. Continue taking this medication, and follow the directions you see here. Changed by: Otis Peak, CMA   amLODipine 10 MG tablet Commonly known as: NORVASC Take 10 mg by mouth daily.   docusate sodium 100 MG capsule Commonly known as: COLACE Take 100 mg by mouth 2 (two) times daily. What changed: Another medication with the same name was removed. Continue taking this medication, and follow the directions you see here. Changed by: Otis Peak, CMA   ferrous sulfate 325 (65 FE) MG tablet Take 325 mg by mouth daily. What changed: Another medication with the same name was removed. Continue taking this medication, and follow the directions you see here. Changed by: Otis Peak, CMA   latanoprost 0.005 % ophthalmic solution Commonly known as: XALATAN Place 1 drop into both eyes at bedtime. What changed: Another medication with the same name was removed. Continue taking this medication, and follow the directions you see here. Changed by: Otis Peak, CMA   losartan-hydrochlorothiazide 50-12.5 MG tablet Commonly known as: HYZAAR Take 1 tablet by mouth at bedtime. What changed: Another medication with the same name was removed. Continue taking this medication, and follow the directions you see here. Changed by: Otis Peak, CMA   rosuvastatin 10 MG tablet Commonly known as: CRESTOR Take 10 mg by mouth at bedtime. What changed: Another medication with the same name was removed. Continue taking this medication, and follow the directions you see here. Changed by: Otis Peak, CMA   sertraline 25 MG tablet Commonly known as: ZOLOFT Take 1 tablet (25 mg total) by  mouth daily. Started by: Sandrea Hughs, NP        Review of Systems  Constitutional:  Negative for appetite change, chills, fatigue, fever and unexpected weight change.  HENT:  Positive for dental problem. Negative for congestion, ear discharge, ear pain, facial swelling, hearing loss, nosebleeds, postnasal drip, rhinorrhea, sinus pressure, sinus pain, sneezing, sore throat, tinnitus and trouble swallowing.        Has missing teeth wears partial teeth   Eyes:  Negative for pain, discharge, redness, itching and visual disturbance.  Respiratory:  Negative for cough, chest tightness, shortness of breath and wheezing.   Cardiovascular:  Negative for chest pain, palpitations and leg swelling.  Gastrointestinal:  Negative for abdominal distention, abdominal pain, blood in stool, diarrhea, nausea and vomiting.       On colace due to use of ferrous sulfate but has not had issues with constipation   Endocrine: Negative for cold intolerance, heat intolerance, polydipsia, polyphagia and polyuria.  Genitourinary:  Positive for frequency. Negative for difficulty urinating, dysuria, flank pain and urgency.  Musculoskeletal:  Positive for arthralgias and gait problem. Negative for back pain, joint swelling, myalgias, neck pain and neck  stiffness.       Scoliosis   Skin:  Negative for color change, pallor, rash and wound.  Neurological:  Negative for dizziness, syncope, speech difficulty, weakness, light-headedness, numbness and headaches.  Hematological:  Does not bruise/bleed easily.  Psychiatric/Behavioral:  Positive for sleep disturbance. Negative for agitation, behavioral problems, confusion, self-injury and suicidal ideas. The patient is nervous/anxious.        Hallucination sometimes  Paranoid     There is no immunization history on file for this patient. Pertinent  Health Maintenance Due  Topic Date Due   DEXA SCAN  08/23/2021 (Originally 02/05/2011)   PNA vac Low Risk Adult (1 of 2 - PCV13)  08/24/2021 (Originally 02/05/2011)   INFLUENZA VACCINE  04/06/2021   COLONOSCOPY (Pts 45-39yr Insurance coverage will need to be confirmed)  09/29/2026   Fall Risk  03/10/2021  Falls in the past year? 0  Number falls in past yr: 0  Injury with Fall? 0  Risk for fall due to : No Fall Risks  Follow up Falls evaluation completed   Functional Status Survey:    Vitals:   03/10/21 1313 03/10/21 1433  BP: (!) 158/110 (!) 130/98  Pulse: 94   Resp: 16   Temp: (!) 97.1 F (36.2 C)   SpO2: 94%   Weight: 153 lb 3.2 oz (69.5 kg)   Height: '5\' 3"'  (1.6 m)    Body mass index is 27.14 kg/m. Physical Exam Vitals reviewed.  Constitutional:      General: She is not in acute distress.    Appearance: Normal appearance. She is overweight. She is not ill-appearing or diaphoretic.  HENT:     Head: Normocephalic.     Right Ear: Tympanic membrane, ear canal and external ear normal. There is no impacted cerumen.     Left Ear: There is impacted cerumen.     Nose: Nose normal. No congestion or rhinorrhea.     Mouth/Throat:     Mouth: Mucous membranes are moist.     Pharynx: Oropharynx is clear. No oropharyngeal exudate or posterior oropharyngeal erythema.  Eyes:     General: No scleral icterus.       Right eye: No discharge.        Left eye: No discharge.     Extraocular Movements: Extraocular movements intact.     Conjunctiva/sclera: Conjunctivae normal.     Pupils: Pupils are equal, round, and reactive to light.  Neck:     Vascular: No carotid bruit.  Cardiovascular:     Rate and Rhythm: Normal rate and regular rhythm.     Pulses: Normal pulses.     Heart sounds: Normal heart sounds. No murmur heard.   No friction rub. No gallop.  Pulmonary:     Effort: Pulmonary effort is normal. No respiratory distress.     Breath sounds: Normal breath sounds. No wheezing, rhonchi or rales.  Chest:     Chest wall: No tenderness.  Abdominal:     General: Bowel sounds are normal. There is no distension.      Palpations: Abdomen is soft. There is no mass.     Tenderness: There is no abdominal tenderness. There is no right CVA tenderness, left CVA tenderness, guarding or rebound.  Musculoskeletal:        General: No swelling or tenderness.     Cervical back: Normal range of motion. No rigidity or tenderness.     Lumbar back: Scoliosis present.     Right lower leg: No edema.  Left lower leg: No edema.     Comments: Back brace in place   Lymphadenopathy:     Cervical: No cervical adenopathy.  Skin:    General: Skin is warm and dry.     Coloration: Skin is not pale.     Findings: No bruising, erythema, lesion or rash.  Neurological:     Mental Status: She is alert and oriented to person, place, and time.     Cranial Nerves: No cranial nerve deficit.     Sensory: No sensory deficit.     Motor: No weakness.     Coordination: Coordination normal.     Gait: Gait normal.     Comments: Memory lapse at times   Psychiatric:        Mood and Affect: Mood is anxious. Affect is tearful.        Speech: Speech normal.        Behavior: Behavior normal.        Thought Content: Thought content is paranoid.        Judgment: Judgment normal.    Labs reviewed: Recent Labs    03/01/21 0131 03/02/21 1112 03/11/21 0841  NA 133* 134* 136  K 3.3* 4.1 4.1  CL 97* 101 102  CO2 21* 25 24  GLUCOSE 99 116* 100*  BUN '23 13 7  ' CREATININE 1.60* 1.01* 0.68  CALCIUM 9.8 9.7 10.0   Recent Labs    02/14/21 1520 02/26/21 1930 03/01/21 0131 03/11/21 0841  AST '21 21 20 14  ' ALT '13 15 14 11  ' ALKPHOS 57 65 63  --   BILITOT 0.8 1.3* 0.9 0.7  PROT 7.0 8.5* 7.8 6.9  ALBUMIN 3.7 4.6 4.3  --    Recent Labs    02/26/21 1930 03/01/21 0131 03/11/21 0841  WBC 9.9 9.5 7.6  NEUTROABS 6.3 7.1 5,297  HGB 13.6 12.7 12.7  HCT 42.0 39.5 38.8  MCV 82.7 82.6 82.6  PLT 279 264 273   Lab Results  Component Value Date   TSH 1.70 03/11/2021   Lab Results  Component Value Date   HGBA1C 5.3 09/28/2016    Lab Results  Component Value Date   CHOL 239 (H) 03/11/2021   HDL 56 03/11/2021   LDLCALC 167 (H) 03/11/2021   TRIG 63 03/11/2021   CHOLHDL 4.3 03/11/2021    Significant Diagnostic Results in last 30 days:  DG Chest 1 View  Result Date: 02/14/2021 CLINICAL DATA:  Acute mental status change EXAM: CHEST  1 VIEW COMPARISON:  None. FINDINGS: No pneumothorax. Probable mild cardiomegaly, not well assessed on a portable film. The hila and mediastinum are unremarkable. The right lung is clear. Possible small effusion and associated atelectasis in the left base. No other acute abnormalities. IMPRESSION: Possible small effusion with associated atelectasis in the left base. Developing infiltrate considered less likely. A PA and lateral chest x-ray may better evaluate. No other abnormalities. Electronically Signed   By: Dorise Bullion III M.D   On: 02/14/2021 15:50   CT Head Wo Contrast  Result Date: 02/14/2021 CLINICAL DATA:  Altered mental status EXAM: CT HEAD WITHOUT CONTRAST TECHNIQUE: Contiguous axial images were obtained from the base of the skull through the vertex without intravenous contrast. COMPARISON:  None. FINDINGS: Brain: No evidence of acute infarction, hemorrhage, hydrocephalus, extra-axial collection or mass lesion/mass effect. Vascular: No hyperdense vessel or unexpected calcification. Skull: Normal. Negative for fracture or focal lesion. Sinuses/Orbits: No acute finding. Other: None. IMPRESSION: No acute intracranial abnormality. Electronically Signed  By: Valentino Saxon MD   On: 02/14/2021 15:49   MR BRAIN WO CONTRAST  Result Date: 02/15/2021 CLINICAL DATA:  Initial evaluation for neuro deficit, stroke suspected. EXAM: MRI HEAD WITHOUT CONTRAST TECHNIQUE: Multiplanar, multiecho pulse sequences of the brain and surrounding structures were obtained without intravenous contrast. COMPARISON:  Prior CT from earlier the same day. FINDINGS: Brain: Cerebral volume within normal limits.  Minimal chronic microvascular ischemic disease noted involving the periventricular white matter. 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 No abnormal foci of restricted diffusion to suggest acute or subacute ischemia. Gray-white matter differentiation maintained. No encephalomalacia to suggest chronic cortical infarction elsewhere within the brain. No other evidence for acute or chronic intracranial hemorrhage. No mass lesion, midline shift or mass effect. No hydrocephalus or extra-axial fluid collection. Pituitary gland suprasellar region normal. Midline structures intact. Vascular: Major intracranial vascular flow voids are maintained. Skull and upper cervical spine: Craniocervical junction within normal limits. Degenerative spondylosis noted at C3-4 with associated mild to moderate spinal stenosis. Bone marrow signal intensity normal. No scalp soft tissue abnormality. Sinuses/Orbits: Globes and orbital soft tissues within normal limits. Paranasal sinuses are clear. No mastoid effusion. Other: None. IMPRESSION: 1. No acute intracranial abnormality. 2. 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 3. Underlying mild chronic microvascular ischemic disease for age. 4. Degenerative spondylosis at C3-4 with resultant mild to moderate spinal stenosis. Finding could be further assessed with dedicated MRI of the cervical spine as clinically warranted. Electronically Signed   By: Jeannine Boga M.D.   On: 02/15/2021 00:36   US RENAL  Result Date: 03/02/2021 CLINICAL DATA:  Hematuria and urinary frequency EXAM: RENAL / URINARY TRACT ULTRASOUND COMPLETE COMPARISON:  CT abdomen and pelvis September 28, 2016 FINDINGS: Right Kidney: Renal measurements: 9.6 x 4.0 x 4.8 cm = volume: 97 mL. Echogenicity and renal cortical thickness are within normal limits. No mass, perinephric fluid, or hydronephrosis visualized. No sonographically demonstrable calculus or ureterectasis. Left Kidney: Renal  measurements: 9.0 x 3.2 x 4.3 cm = volume: 66 mL. Echogenicity and renal cortical thickness are within normal limits. No mass, perinephric fluid, or hydronephrosis visualized. No sonographically demonstrable calculus or ureterectasis. Bladder: Appears normal for degree of bladder distention. Other: None. IMPRESSION: Study within normal limits. Electronically Signed   By: Lowella Grip III M.D.   On: 03/02/2021 11:32   DG Chest Portable 1 View  Result Date: 03/01/2021 CLINICAL DATA:  Altered mental status EXAM: PORTABLE CHEST 1 VIEW COMPARISON:  02/14/2021 FINDINGS: Chronic asymmetric elevation of the left diaphragm. Superimposed heart size is normal appearing. There is no edema, consolidation, effusion, or pneumothorax. Scoliosis IMPRESSION: Continued elevation of the left diaphragm. No acute change from 02/14/2021. Electronically Signed   By: Monte Fantasia M.D.   On: 03/01/2021 06:35    Assessment/Plan  1. Essential hypertension B/p elevated today did not take her B/p medication.clonidine given as below recheck B/p improved. - continue on losartan-HCZT and amlodipine  - continue on Statin  - cloNIDine (CATAPRES) tablet 0.1 mg - CBC with Differential/Platelet; Future - CMP with eGFR([facility]); Future - TSH; Future - Lipid panel; Future - follow up in 2 weeks for B/p check  2. Encounter for hepatitis C screening test for low risk patient Reports no high risk behavior or blood transfusion.  - Hep C Antibody; Future  3. Vitamin B12 deficiency Has memory lapse sometimes - Vitamin B12; Future  4. Vitamin D deficiency On vit D supplement in the past unclear if still taking.  5. Overgrown  toenails Long thick toenail.refer to podiatrist to trim toe nails.  - Ambulatory referral to Podiatry  6. Iron deficiency Hgb 12.7 on chart  Continue on Ferrous sulfate  Advised to continue on docusate while taking ferrous sulfate.No constipation reported.  7. Insomnia due to other mental  disorder Her anxiety issues and paranoia contributing to sleep disturbance. Will refer to Neuro - Ambulatory referral to Neurology  8. Encounter to establish care Immunization reviewed due for Tdap,Shingrix,PNA and COVID-19 vaccine became tearful when vaccine was recommended states not able to go to pharmacy or get any vaccine feels overwhelmed due to frequent medical visit that she has had.will post pone to next visit.  -  Medication and labs reviewed patient counselled regarding yearly exam, prevention of dental and periodontal disease, diet, regular sustained exercise for at least 30 minutes x 3 /week,COVID-19 hand hygiene, mask and social distancing per CDC guidelines.recommended schedule for routine labs.  9. 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  she was seen at [facility] for AMS Seroquel 12.5 mg tablet daily was prescribed but Daughter stopped giving her Seroquel did not  like the way it made the patient feel " drowsy and loopy" - Ambulatory referral to Neurology  10. Urine frequency Status post ED visit 02/14/2021,02/24/2021; 02/26/2021,6/26,and 03/03/2021 for acute cystitis,paranoid and Altered mental status.  - Urine Culture - POC Urinalysis Dipstick indicates cloudy urine positive for ketones and large leukocytes but negative for Nitrites or blood.will send for culture. - advised to increased her water intake to 6-8 glasses of water daily.  11. Generalized anxiety disorder Tearful during visit.Agrees to start on sertraline.refer to neuropsychologist.  Start on Sertraline as below SE discussed.made aware that it will take 2-4 weeks for medication to be effective.will follow up in 2 weeks. -  sertraline (ZOLOFT) 25 MG tablet; Take 1 tablet (25 mg total) by mouth daily.   Dispense: 30 tablet; Refill: 0 - Ambulatory referral to Neurology  12. Impacted cerumen of left ear Defer until her paranoia is stable then will recommend debrox 6.5 % otic solution to left ear twice daily x 4 days then follow up for lavage.  13. Overweight with body mass index (BMI) 25.0-29.9 BMI 27.14  Dietary modification and exercise as tolerated.  14. Body mass index 27.0-27.9, adult BMI 27.14  Dietary modification and exercise as above recommended but this is limited due to her current paranoia does not like to go out to the [facility] where she used to do water aerobic/swimming.   Family/ staff Communication: Reviewed plan of care with patient and daughter verbalized understanding.   Labs/tests ordered:  - CBC with Differential/Platelet - CMP with eGFR([facility]) - TSH - Lipid panel - Vitamin B 12 - Hep C Antibody  Next Appointment : 2 weeks for blood pressure check and Generalized anxiety disorder  Addendum 03/26/2021: Medical records received History updated.records send to be scan to Epic.  Sandrea Hughs, NP

## 2021-03-10 NOTE — Patient Instructions (Addendum)
- Please check your blood pressure twice daily and record on log. Please bring log to visit  - Referral order  for Neuropsychologist and Podiatrist specialist office will call you for appointment   - Take Zoloft 25 mg tablet one by mouth daily at night for anxiety   [email or web address]">  DASH Eating Plan DASH stands for Dietary Approaches to Stop Hypertension. The DASH eating plan is a healthy eating plan that has been shown to: Reduce high blood pressure (hypertension). Reduce your risk for type 2 diabetes, heart disease, and stroke. Help with weight loss. What are tips for following this plan? Reading food labels Check food labels for the amount of salt (sodium) per serving. Choose foods with less than 5 percent of the Daily Value of sodium. Generally, foods with less than 300 milligrams (mg) of sodium per serving fit into this eating plan. To find whole grains, look for the word "whole" as the first word in the ingredient list. Shopping Buy products labeled as "low-sodium" or "no salt added." Buy fresh foods. Avoid canned foods and pre-made or frozen meals. Cooking Avoid adding salt when cooking. Use salt-free seasonings or herbs instead of table salt or sea salt. Check with your health care provider or pharmacist before using salt substitutes. Do not fry foods. Cook foods using healthy methods such as baking, boiling, grilling, roasting, and broiling instead. Cook with heart-healthy oils, such as olive, canola, avocado, soybean, or sunflower oil. Meal planning  Eat a balanced diet that includes: 4 or more servings of fruits and 4 or more servings of vegetables each day. Try to fill one-half of your plate with fruits and vegetables. 6-8 servings of whole grains each day. Less than 6 oz (170 g) of lean meat, poultry, or fish each day. A 3-oz (85-g) serving of meat is about the same size as a deck of cards. One egg equals 1 oz (28 g). 2-3  servings of low-fat dairy each day. One serving is 1 cup (237 mL). 1 serving of nuts, seeds, or beans 5 times each week. 2-3 servings of heart-healthy fats. Healthy fats called omega-3 fatty acids are found in foods such as walnuts, flaxseeds, fortified milks, and eggs. These fats are also found in cold-water fish, such as sardines, salmon, and mackerel. Limit how much you eat of: Canned or prepackaged foods. Food that is high in trans fat, such as some fried foods. Food that is high in saturated fat, such as fatty meat. Desserts and other sweets, sugary drinks, and other foods with added sugar. Full-fat dairy products. Do not salt foods before eating. Do not eat more than 4 egg yolks a week. Try to eat at least 2 vegetarian meals a week. Eat more home-cooked food and less restaurant, buffet, and fast food.  Lifestyle When eating at a restaurant, ask that your food be prepared with less salt or no salt, if possible. If you drink alcohol: Limit how much you use to: 0-1 drink a day for women who are not pregnant. 0-2 drinks a day for men. Be aware of how much alcohol is in your drink. In the [place] one drink equals one 12 oz bottle of beer (355 mL), one 5 oz glass of wine (148 mL), or one 1 oz glass of hard liquor (44 mL). General information Avoid eating more than 2,300 mg of salt a day. If you have hypertension, you may need to reduce your sodium intake to 1,500 mg a day. Work with your health care provider to  maintain a healthy body weight or to lose weight. Ask what an ideal weight is for you. Get at least 30 minutes of exercise that causes your heart to beat faster (aerobic exercise) most days of the week. Activities may include walking, swimming, or biking. Work with your health care provider or dietitian to adjust your eating plan to your individual calorie needs. What foods should I eat? Fruits All fresh, dried, or frozen fruit. Canned fruit in natural juice (without  addedsugar). Vegetables Fresh or frozen vegetables (raw, steamed, roasted, or grilled). Low-sodium or reduced-sodium tomato and vegetable juice. Low-sodium or reduced-sodium tomatosauce and tomato paste. Low-sodium or reduced-sodium canned vegetables. Grains Whole-grain or whole-wheat bread. Whole-grain or whole-wheat pasta. Brown rice. [place]. Bulgur. Whole-grain and low-sodium cereals. Pita bread.Low-fat, low-sodium crackers. Whole-wheat flour tortillas. Meats and other proteins Skinless chicken or [place]. Ground chicken or [place]. Pork with fat trimmed off. Fish and seafood. Egg whites. Dried beans, peas, or lentils. Unsalted nuts, nut butters, and seeds. Unsalted canned beans. Lean cuts of beef with fat trimmed off. Low-sodium, lean precooked or cured meat, such as sausages or meatloaves. Dairy Low-fat (1%) or fat-free (skim) milk. Reduced-fat, low-fat, or fat-free cheeses. Nonfat, low-sodium ricotta or cottage cheese. Low-fat or nonfatyogurt. Low-fat, low-sodium cheese. Fats and oils Soft margarine without trans fats. Vegetable oil. Reduced-fat, low-fat, or light mayonnaise and salad dressings (reduced-sodium). Canola, safflower, olive, avocado, soybean, andsunflower oils. Avocado. Seasonings and condiments Herbs. Spices. Seasoning mixes without salt. Other foods Unsalted popcorn and pretzels. Fat-free sweets. The items listed above may not be a complete list of foods and beverages you can eat. Contact a dietitian for more information. What foods should I avoid? Fruits Canned fruit in a light or heavy syrup. Fried fruit. Fruit in cream or buttersauce. Vegetables Creamed or fried vegetables. Vegetables in a cheese sauce. Regular canned vegetables (not low-sodium or reduced-sodium). Regular canned tomato sauce and paste (not low-sodium or reduced-sodium). Regular tomato and vegetable juice(not low-sodium or reduced-sodium). Angie Fava. Olives. Grains Baked goods made with fat, such as  croissants, muffins, or some breads. Drypasta or rice meal packs. Meats and other proteins Fatty cuts of meat. Ribs. Fried meat. Berniece Salines. Bologna, salami, and other precooked or cured meats, such as sausages or meat loaves. Fat from the back of a pig (fatback). Bratwurst. Salted nuts and seeds. Canned beans with added salt. Canned orsmoked fish. Whole eggs or egg yolks. Chicken or [place] with skin. Dairy Whole or 2% milk, cream, and half-and-half. Whole or full-fat cream cheese. Whole-fat or sweetened yogurt. Full-fat cheese. Nondairy creamers. Whippedtoppings. Processed cheese and cheese spreads. Fats and oils Butter. Stick margarine. Lard. Shortening. Ghee. Bacon fat. Tropical oils, suchas coconut, palm kernel, or palm oil. Seasonings and condiments Onion salt, garlic salt, seasoned salt, table salt, and sea salt. Worcestershire sauce. Tartar sauce. Barbecue sauce. Teriyaki sauce. Soy sauce, including reduced-sodium. Steak sauce. Canned and packaged gravies. Fish sauce. Oyster sauce. Cocktail sauce. Store-bought horseradish. Ketchup. Mustard. Meat flavorings and tenderizers. Bouillon cubes. Hot sauces. Pre-made or packaged marinades. Pre-made or packaged taco seasonings. Relishes. Regular saladdressings. Other foods Salted popcorn and pretzels. The items listed above may not be a complete list of foods and beverages you should avoid. Contact a dietitian for more information. Where to find more information National Heart, Lung, and Blood Institute: https://wilson-eaton.com/ American Heart Association: www.heart.org Academy of Nutrition and Dietetics: www.eatright.[place]: www.kidney.org Summary The DASH eating plan is a healthy eating plan that has been shown to reduce high blood pressure (  hypertension). It may also reduce your risk for type 2 diabetes, heart disease, and stroke. When on the DASH eating plan, aim to eat more fresh fruits and vegetables, whole grains, lean proteins,  low-fat dairy, and heart-healthy fats. With the DASH eating plan, you should limit salt (sodium) intake to 2,300 mg a day. If you have hypertension, you may need to reduce your sodium intake to 1,500 mg a day. Work with your health care provider or dietitian to adjust your eating plan to your individual calorie needs. This information is not intended to replace advice given to you by your health care provider. Make sure you discuss any questions you have with your healthcare provider. Document Revised: 07/27/2019 Document Reviewed: 07/27/2019 Elsevier Patient Education  2022 [employer].   Sertraline Tablets What is this medication? SERTRALINE (SER tra leen) treats depression, anxiety, obsessive-compulsive disorder (OCD), post-traumatic stress disorder (PTSD), and premenstrual dysphoric disorder (PMDD). It increases the amount of serotonin in the brain, a hormone that helps regulate mood. It belongs to a group of medications calledSSRIs. This medicine may be used for other purposes; ask your health care provider orpharmacist if you have questions. COMMON BRAND NAME(S): Zoloft What should I tell my care team before I take this medication? They need to know if you have any of these conditions: Bleeding disorders Bipolar disorder or a family history of bipolar disorder Heart disease High blood pressure History of irregular heartbeat History of low levels of calcium, magnesium, or potassium in the blood If you often drink alcohol Liver disease Receiving electroconvulsive therapy Seizures Suicidal thoughts, plans, or attempt; a previous suicide attempt by you or a family member Take medications that treat or prevent blood clots Thyroid disease An unusual or allergic reaction to sertraline, other medications, foods, dyes, or preservatives Pregnant or trying to get pregnant Breast-feeding How should I use this medication? Take this medication by mouth with a glass of water. Follow the  directions on the prescription label. You can take it with or without food. Take your medication at regular intervals. Do not take your medication more often than directed. Do not stop taking this medication suddenly except upon the advice of your care team. Stopping this medication too quickly may cause serious sideeffects or your condition may worsen. A special MedGuide will be given to you by the pharmacist with eachprescription and refill. Be sure to read this information carefully each time. Talk to your care team about the use of this medication in children. While this medication may be prescribed for children as young as 7 years for selectedconditions, precautions do apply. Overdosage: If you think you have taken too much of this medicine contact apoison control center or emergency room at once. NOTE: This medicine is only for you. Do not share this medicine with others. What if I miss a dose? If you miss a dose, take it as soon as you can. If it is almost time for yournext dose, take only that dose. Do not take double or extra doses. What may interact with this medication? Do not take this medication with any of the following: Cisapride Dronedarone Linezolid MAOIs like Carbex, Eldepryl, Marplan, Nardil, and Parnate Methylene blue (injected into a vein) Pimozide Thioridazine This medication may also interact with the following: Alcohol Amphetamines Aspirin and aspirin-like medications Certain medications for depression, anxiety, or psychotic disturbances Certain medications for fungal infections like ketoconazole, fluconazole, posaconazole, and itraconazole Certain medications for irregular heart beat like flecainide, quinidine, propafenone Certain medications for migraine headaches  like almotriptan, eletriptan, frovatriptan, naratriptan, rizatriptan, sumatriptan, zolmitriptan Certain medications for sleep Certain medications for seizures like carbamazepine, valproic acid,  phenytoin Certain medications that treat or prevent blood clots like warfarin, enoxaparin, dalteparin Cimetidine Digoxin Diuretics Fentanyl Isoniazid Lithium NSAIDs, medications for pain and inflammation, like ibuprofen or naproxen Other medications that prolong the QT interval (cause an abnormal heart rhythm) like dofetilide Rasagiline Safinamide Supplements like St. John's wort, kava kava, valerian Tolbutamide Tramadol Tryptophan This list may not describe all possible interactions. Give your health care provider a list of all the medicines, herbs, non-prescription drugs, or dietary supplements you use. Also tell them if you smoke, drink alcohol, or use illegaldrugs. Some items may interact with your medicine. What should I watch for while using this medication? Tell your health care provider if your symptoms do not get better or if they get worse. Visit your health care provider for regular checks on your progress. Because it may take several weeks to see the full effects of this medication, it is important to continue your treatment as prescribed by your health careprovider. Patients and their families should watch out for new or worsening thoughts of suicide or depression. Also watch out for sudden changes in feelings such as feeling anxious, agitated, panicky, irritable, hostile, aggressive, impulsive, severely restless, overly excited and hyperactive, or not being able to sleep. If this happens, especially at the beginning of treatment or after a change indose, call your health care provider. You may get drowsy or dizzy. Do not drive, use machinery, or do anything that needs mental alertness until you know how this medication affects you. Do not stand or sit up quickly, especially if you are an older patient. This reduces the risk of dizzy or fainting spells. Alcohol may interfere with the effect ofthis medication. Your mouth may get dry. Chewing sugarless gum or sucking hard candy, and  drinking plenty of water may help. Contact your health care provider if theproblem does not go away or is severe. What side effects may I notice from receiving this medication? Side effects that you should report to your care team as soon as possible: Allergic reactions-skin rash, itching, hives, swelling of the face, lips, tongue, or throat Bleeding-bloody or black, tar-like stools, red or dark brown urine, vomiting blood or brown material that looks like coffee grounds, small red or purple spots on skin, unusual bleeding or bruising Heart rhythm changes-fast or irregular heartbeat, dizziness, feeling faint or lightheaded, chest pain, trouble breathing Low sodium level-muscle weakness, fatigue, dizziness, headache, confusion Serotonin syndrome-irritability, confusion, fast or irregular heartbeat, muscle stiffness, twitching muscles, sweating, high fever, seizure, chills, vomiting, diarrhea Sudden eye pain or change in vision such as blurred vision, seeing halos around lights, vision loss Thoughts of suicide or self-harm, worsening mood Side effects that usually do not require medical attention (report these toyour care team if they continue or are bothersome): Change in sex drive or performance Diarrhea Excessive sweating Nausea Tremors or shaking Upset stomach This list may not describe all possible side effects. Call your doctor for medical advice about side effects. You may report side effects to FDA at1-800-FDA-1088. Where should I keep my medication? Keep out of the reach of children and pets. Store at room temperature between 15 and 30 degrees C (59 and 86 degrees F).Get rid of any unused medication after the expiration date. To get rid of medications that are no longer needed or expired: Take the medication to a medication take-back program. Check with your pharmacy or  law enforcement to find a location. If you cannot return the medication, check the label or package insert to see if  the medication should be thrown out in the garbage or flushed down the toilet. If you are not sure, ask your care team. If it is safe to put in the trash, empty the medication out of the container. Mix the medication with cat litter, dirt, coffee grounds, or other unwanted substance. Seal the mixture in a bag or container. Put it in the trash. NOTE: This sheet is a summary. It may not cover all possible information. If you have questions about this medicine, talk to your doctor, pharmacist, orhealth care provider.  2022 Elsevier/Gold Standard (2020-09-19 15:52:08)

## 2021-03-11 ENCOUNTER — Telehealth: Payer: Self-pay | Admitting: Family

## 2021-03-11 ENCOUNTER — Other Ambulatory Visit: Payer: Self-pay

## 2021-03-11 ENCOUNTER — Other Ambulatory Visit: Payer: Medicare Other

## 2021-03-11 DIAGNOSIS — I10 Essential (primary) hypertension: Secondary | ICD-10-CM

## 2021-03-11 DIAGNOSIS — E538 Deficiency of other specified B group vitamins: Secondary | ICD-10-CM

## 2021-03-11 DIAGNOSIS — Z1159 Encounter for screening for other viral diseases: Secondary | ICD-10-CM

## 2021-03-11 NOTE — Telephone Encounter (Signed)
Pts daughter Albina Billet) called to get # for Neurologist for scheduling.   Notes are not clear on where or what the referral is going to be sent for?  Consult or testing? [facility] has denied referral bc they only see adolescence.   If pt needs Neuro psych will need [facility], bc [facility] doesn't have MD available for that diagnosis.  Please advise, Vilinda Blanks

## 2021-03-11 NOTE — Telephone Encounter (Signed)
Referral to Neuropsychologist.

## 2021-03-12 ENCOUNTER — Ambulatory Visit: Payer: Medicare Other | Admitting: Family

## 2021-03-12 ENCOUNTER — Telehealth: Payer: Self-pay

## 2021-03-12 LAB — LIPID PANEL
Cholesterol: 239 mg/dL — ABNORMAL HIGH (ref <200)
HDL: 56 mg/dL (ref >=50)
LDL Cholesterol (Calc): 167 mg/dL (calc) — ABNORMAL HIGH
Non-HDL Cholesterol (Calc): 183 mg/dL (calc) — ABNORMAL HIGH (ref <130)
Total CHOL/HDL Ratio: 4.3 (calc) (ref <5.0)
Triglycerides: 63 mg/dL (ref <150)

## 2021-03-12 LAB — COMPLETE METABOLIC PANEL WITH GFR
AG Ratio: 1.4 (calc) (ref 1.0–2.5)
ALT: 11 U/L (ref 6–29)
AST: 14 U/L (ref 10–35)
Albumin: 4 g/dL (ref 3.6–5.1)
Alkaline phosphatase (APISO): 63 U/L (ref 37–153)
BUN: 7 mg/dL (ref 7–25)
CO2: 24 mmol/L (ref 20–32)
Calcium: 10 mg/dL (ref 8.6–10.4)
Chloride: 102 mmol/L (ref 98–110)
Creat: 0.68 mg/dL (ref 0.60–0.93)
GFR, Est African American: 99 mL/min/{1.73_m2} (ref >=60)
GFR, Est Non African American: 86 mL/min/{1.73_m2} (ref >=60)
Globulin: 2.9 g/dL (calc) (ref 1.9–3.7)
Glucose, Bld: 100 mg/dL — ABNORMAL HIGH (ref 65–99)
Potassium: 4.1 mmol/L (ref 3.5–5.3)
Sodium: 136 mmol/L (ref 135–146)
Total Bilirubin: 0.7 mg/dL (ref 0.2–1.2)
Total Protein: 6.9 g/dL (ref 6.1–8.1)

## 2021-03-12 LAB — URINE CULTURE
MICRO NUMBER:: 12084437
Result:: NO GROWTH
SPECIMEN QUALITY:: ADEQUATE

## 2021-03-12 LAB — CBC WITH DIFFERENTIAL/PLATELET
Absolute Monocytes: 372 cells/uL (ref 200–950)
Basophils Absolute: 53 cells/uL (ref 0–200)
Basophils Relative: 0.7 %
Eosinophils Absolute: 53 cells/uL (ref 15–500)
Eosinophils Relative: 0.7 %
HCT: 38.8 % (ref 35.0–45.0)
Hemoglobin: 12.7 g/dL (ref 11.7–15.5)
Lymphs Abs: 1824 cells/uL (ref 850–3900)
MCH: 27 pg (ref 27.0–33.0)
MCHC: 32.7 g/dL (ref 32.0–36.0)
MCV: 82.6 fL (ref 80.0–100.0)
MPV: 12.1 fL (ref 7.5–12.5)
Monocytes Relative: 4.9 %
Neutro Abs: 5297 cells/uL (ref 1500–7800)
Neutrophils Relative %: 69.7 %
Platelets: 273 10*3/uL (ref 140–400)
RBC: 4.7 10*6/uL (ref 3.80–5.10)
RDW: 14.8 % (ref 11.0–15.0)
Total Lymphocyte: 24 %
WBC: 7.6 10*3/uL (ref 3.8–10.8)

## 2021-03-12 LAB — TSH: TSH: 1.7 mIU/L (ref 0.40–4.50)

## 2021-03-12 LAB — VITAMIN B12: Vitamin B-12: 549 pg/mL (ref 200–1100)

## 2021-03-12 LAB — HEPATITIS C ANTIBODY
Hepatitis C Ab: NONREACTIVE
SIGNAL TO CUT-OFF: 0.01 (ref <1.00)

## 2021-03-12 NOTE — Telephone Encounter (Signed)
Patient daughter Notified and agreed to take her.

## 2021-03-12 NOTE — Telephone Encounter (Signed)
Patients daughter called and appeared to be in tears about her mother's condition.  Meghan Stanley states she needs some urgent help with her mother. Meghan Stanley states her mother is getting worse (mentally) and they do not know what to do. Meghan Stanley asked for a status update regarding neurology referral. I read the notes to [facility] from the referral and she states she does not see how her mother world benefit from seeing a Teacher, music

## 2021-03-12 NOTE — Telephone Encounter (Signed)
[name], daughter, called this morning and left message on clinical intake stating that her mother is worse and she thinks she needs to be in a hospital short term somewhere. And wants to know what to do.  Please Advise.    Tried calling daughter back, LMOM to return call.

## 2021-03-12 NOTE — Telephone Encounter (Signed)
LMOM to return call.

## 2021-03-12 NOTE — Telephone Encounter (Signed)
Daughter notified and agreed to take her.

## 2021-03-12 NOTE — Telephone Encounter (Signed)
Ngetich, Dinah C, NP 5 hours ago (10:07 AM)   If daughter thinks the condition has worsen and in danger to herself and others then she need to take to the ED or call 9-1-1 if unable to take her.       Note

## 2021-03-12 NOTE — Telephone Encounter (Signed)
Noted  

## 2021-03-12 NOTE — Telephone Encounter (Signed)
If daughter thinks the condition has worsen and in danger to herself and others then she need to take to the ED or call 9-1-1 if unable to take her.

## 2021-03-13 ENCOUNTER — Ambulatory Visit: Payer: Medicare Other | Admitting: Family

## 2021-03-15 ENCOUNTER — Emergency Department (HOSPITAL_COMMUNITY)
Admission: EM | Admit: 2021-03-15 | Discharge: 2021-03-17 | Disposition: A | Discharge status: Other Acute Inpatient Hospital | Payer: Medicare Other | Attending: Emergency Medicine | Admitting: Emergency Medicine

## 2021-03-15 ENCOUNTER — Encounter (HOSPITAL_COMMUNITY): Payer: Self-pay

## 2021-03-15 ENCOUNTER — Emergency Department (HOSPITAL_COMMUNITY): Payer: Medicare Other

## 2021-03-15 ENCOUNTER — Other Ambulatory Visit: Payer: Self-pay

## 2021-03-15 DIAGNOSIS — I10 Essential (primary) hypertension: Secondary | ICD-10-CM | POA: Diagnosis not present

## 2021-03-15 DIAGNOSIS — Y900 Blood alcohol level of less than 20 mg/100 ml: Secondary | ICD-10-CM | POA: Diagnosis not present

## 2021-03-15 DIAGNOSIS — Z20822 Contact with and (suspected) exposure to covid-19: Secondary | ICD-10-CM | POA: Diagnosis not present

## 2021-03-15 DIAGNOSIS — F29 Unspecified psychosis not due to a substance or known physiological condition: Secondary | ICD-10-CM | POA: Insufficient documentation

## 2021-03-15 DIAGNOSIS — F22 Delusional disorders: Secondary | ICD-10-CM

## 2021-03-15 DIAGNOSIS — Z79899 Other long term (current) drug therapy: Secondary | ICD-10-CM | POA: Insufficient documentation

## 2021-03-15 DIAGNOSIS — R451 Restlessness and agitation: Secondary | ICD-10-CM

## 2021-03-15 LAB — CBC WITH DIFFERENTIAL/PLATELET
Abs Immature Granulocytes: 0.03 10*3/uL (ref 0.00–0.07)
Basophils Absolute: 0.1 10*3/uL (ref 0.0–0.1)
Basophils Relative: 1 %
Eosinophils Absolute: 0 10*3/uL (ref 0.0–0.5)
Eosinophils Relative: 0 %
HCT: 42.2 % (ref 36.0–46.0)
Hemoglobin: 13.5 g/dL (ref 12.0–15.0)
Immature Granulocytes: 0 %
Lymphocytes Relative: 19 %
Lymphs Abs: 2.1 10*3/uL (ref 0.7–4.0)
MCH: 26.4 pg (ref 26.0–34.0)
MCHC: 32 g/dL (ref 30.0–36.0)
MCV: 82.4 fL (ref 80.0–100.0)
Monocytes Absolute: 0.8 10*3/uL (ref 0.1–1.0)
Monocytes Relative: 7 %
Neutro Abs: 7.9 10*3/uL — ABNORMAL HIGH (ref 1.7–7.7)
Neutrophils Relative %: 73 %
Platelets: 291 10*3/uL (ref 150–400)
RBC: 5.12 MIL/uL — ABNORMAL HIGH (ref 3.87–5.11)
RDW: 15.7 % — ABNORMAL HIGH (ref 11.5–15.5)
WBC: 10.8 10*3/uL — ABNORMAL HIGH (ref 4.0–10.5)
nRBC: 0 % (ref 0.0–0.2)

## 2021-03-15 LAB — COMPREHENSIVE METABOLIC PANEL
ALT: 14 U/L (ref 0–44)
AST: 17 U/L (ref 15–41)
Albumin: 4.3 g/dL (ref 3.5–5.0)
Alkaline Phosphatase: 70 U/L (ref 38–126)
Anion gap: 11 (ref 5–15)
BUN: 17 mg/dL (ref 8–23)
CO2: 26 mmol/L (ref 22–32)
Calcium: 10 mg/dL (ref 8.9–10.3)
Chloride: 99 mmol/L (ref 98–111)
Creatinine, Ser: 0.9 mg/dL (ref 0.44–1.00)
GFR, Estimated: >60 mL/min (ref >60)
Glucose, Bld: 85 mg/dL (ref 70–99)
Potassium: 3.6 mmol/L (ref 3.5–5.1)
Sodium: 136 mmol/L (ref 135–145)
Total Bilirubin: 1.3 mg/dL — ABNORMAL HIGH (ref 0.3–1.2)
Total Protein: 8.4 g/dL — ABNORMAL HIGH (ref 6.5–8.1)

## 2021-03-15 LAB — URINALYSIS, ROUTINE W REFLEX MICROSCOPIC
Bacteria, UA: NONE SEEN
Bilirubin Urine: NEGATIVE
Glucose, UA: NEGATIVE mg/dL
Hgb urine dipstick: NEGATIVE
Ketones, ur: 20 mg/dL — AB
Nitrite: NEGATIVE
Protein, ur: NEGATIVE mg/dL
Specific Gravity, Urine: 1.019 (ref 1.005–1.030)
pH: 6 (ref 5.0–8.0)

## 2021-03-15 LAB — ETHANOL: Alcohol, Ethyl (B): <10 mg/dL (ref <10)

## 2021-03-15 LAB — RAPID URINE DRUG SCREEN, HOSP PERFORMED
Amphetamines: NOT DETECTED
Barbiturates: NOT DETECTED
Benzodiazepines: NOT DETECTED
Cocaine: NOT DETECTED
Opiates: NOT DETECTED
Tetrahydrocannabinol: NOT DETECTED

## 2021-03-15 LAB — SALICYLATE LEVEL: Salicylate Lvl: <7 mg/dL — ABNORMAL LOW (ref 7.0–30.0)

## 2021-03-15 LAB — ACETAMINOPHEN LEVEL: Acetaminophen (Tylenol), Serum: <10 ug/mL — ABNORMAL LOW (ref 10–30)

## 2021-03-15 MED ORDER — ACETAMINOPHEN 500 MG PO TABS: 1000.0000 mg | ORAL_TABLET | Freq: Four times a day (QID) | ORAL | Status: DC | PRN

## 2021-03-15 MED ORDER — LOSARTAN POTASSIUM-HCTZ 50-12.5 MG PO TABS: 1.0000 | ORAL_TABLET | Freq: Every day | ORAL | Status: DC

## 2021-03-15 MED ORDER — AMLODIPINE BESYLATE 5 MG PO TABS
10.0000 mg | ORAL_TABLET | Freq: Every morning | ORAL | Status: DC
  Administered 2021-03-16 – 2021-03-17 (×2): 10 mg via ORAL
  Filled 2021-03-15 (×2): qty 2

## 2021-03-15 MED ORDER — HYDROCHLOROTHIAZIDE 12.5 MG PO CAPS
12.5000 mg | ORAL_CAPSULE | Freq: Every day | ORAL | Status: DC
  Administered 2021-03-16 (×2): 12.5 mg via ORAL
  Filled 2021-03-15 (×2): qty 1

## 2021-03-15 MED ORDER — VITAMIN D 25 MCG (1000 UNIT) PO TABS
5000.0000 [IU] | ORAL_TABLET | Freq: Every day | ORAL | Status: DC
  Administered 2021-03-16 – 2021-03-17 (×2): 5000 [IU] via ORAL
  Filled 2021-03-15 (×2): qty 5

## 2021-03-15 MED ORDER — LORAZEPAM 2 MG/ML IJ SOLN
1.0000 mg | Freq: Once | INTRAMUSCULAR | Status: AC
  Administered 2021-03-15: 1 mg via INTRAVENOUS
  Filled 2021-03-15: qty 1

## 2021-03-15 MED ORDER — LOSARTAN POTASSIUM 50 MG PO TABS
50.0000 mg | ORAL_TABLET | Freq: Every day | ORAL | Status: DC
  Administered 2021-03-16 (×2): 50 mg via ORAL
  Filled 2021-03-15: qty 2
  Filled 2021-03-15 (×2): qty 1

## 2021-03-15 NOTE — ED Notes (Signed)
Urine culture sent with specimen 

## 2021-03-15 NOTE — ED Triage Notes (Signed)
Pt BIB EMS from home. Family report AMS and increased confused x 1 week. Recently treated for UTI. VSS. Ambulatory. Pt seen on 6/26 for same. CBG 106. Pt has an upcoming appointment with neurologist, but family states they cant wait that long.

## 2021-03-15 NOTE — ED Provider Notes (Signed)
[facility] DEPT Provider Note   CSN: [id number] Arrival date & time: 03/15/21  1501     History Chief Complaint  Patient presents with   Altered Mental Status    Meghan Stanley is a 75 y.o. female who presents for evaluation of altered mental status, aggression, paranoia.  Patient reports that she was at her sister's house and states that her family did not like the way she was acting so they made her come here.  Patient states that she was just trying to protect herself and states that "I was trying to get away from the cameras."  Daughter is at bedside who provides additional history.  Patient daughter reports that she she has had intermittent paranoia, confusion that has been ongoing for the last few months.  They initially thought it was due to UTI and patient took antibiotics which cleared up the UTI but symptoms had progressed.  She has been working with patient's primary care doctor regarding dementia diagnosis and seeing a Geri psych neurologist but has not been able to obtain that appointment yet.  She feels like in the last week or so, the symptoms have gotten worse.  She states that patient is concerned about her checkbook and about people stealing money from her.  She is also concerned that there are cameras watching her moves.  She states that there are times where she wants to leave the house and the daughter will not let her and so she tries to push her out of the way so that she can leave.  Patient denies any SI, HI, auditory or visual hallucinations.  Patient denies any complaints at this time.  The history is provided by the patient and a relative.      Past Medical History:  Diagnosis Date   Anemia    Arthritis    Colon polyps    GERD (gastroesophageal reflux disease)    Hemorrhoids    Hiatal hernia    HLD (hyperlipidemia)    Hypertension    Scoliosis     Patient Active Problem List   Diagnosis Date Noted   Iron deficiency anemia due  to chronic blood loss    Esophageal stricture    Hiatal hernia    Benign neoplasm of ascending colon    Benign neoplasm of descending colon    Symptomatic anemia 09/27/2016   Thrombocytosis 09/27/2016   Hyperglycemia 09/27/2016   Scoliosis 09/27/2016    Past Surgical History:  Procedure Laterality Date   COLONOSCOPY N/A 09/29/2016   Procedure: COLONOSCOPY;  Surgeon: Irene Shipper, MD;  Location: [facility];  Service: Endoscopy;  Laterality: N/A;   ESOPHAGOGASTRODUODENOSCOPY N/A 09/29/2016   Procedure: ESOPHAGOGASTRODUODENOSCOPY (EGD);  Surgeon: Irene Shipper, MD;  Location: [facility];  Service: Endoscopy;  Laterality: N/A;   NO PAST SURGERIES       OB History   No obstetric history on file.     Family History  Problem Relation Age of Onset   Hypertension Mother    Heart disease Father    Glaucoma Son     Social History   Tobacco Use   Smoking status: Never   Smokeless tobacco: Never  Vaping Use   Vaping Use: Never used  Substance Use Topics   Alcohol use: No   Drug use: No    Home Medications Prior to Admission medications   Medication Sig Start Date End Date Taking? Authorizing Provider  acetaminophen (TYLENOL) 500 MG tablet Take 1,000 mg by mouth  every 6 (six) hours as needed (pain).   Yes [provider]  amLODipine (NORVASC) 10 MG tablet Take 10 mg by mouth every morning. 11/11/20  Yes [provider]  Cholecalciferol (VITAMIN D-3) 125 MCG (5000 UT) TABS Take 5,000 Units by mouth daily.   Yes [provider]  ferrous sulfate 325 (65 FE) MG tablet Take 325 mg by mouth daily with lunch.   Yes [provider]  latanoprost (XALATAN) 0.005 % ophthalmic solution Place 1 drop into both eyes at bedtime.   Yes [provider]  losartan-hydrochlorothiazide (HYZAAR) 50-12.5 MG tablet Take 1 tablet by mouth at bedtime.   Yes [provider]  Menthol, Topical Analgesic, (BIOFREEZE EX) Apply 1 application topically daily  as needed (arthritis/knee pain).   Yes [provider]  sertraline (ZOLOFT) 25 MG tablet Take 1 tablet (25 mg total) by mouth daily. Patient taking differently: Take 25 mg by mouth at bedtime. 03/10/21  Yes Ngetich, Dinah C, NP    Allergies    Patient has no known allergies.  Review of Systems   Review of Systems  Constitutional:  Negative for fever.  Respiratory:  Negative for cough and shortness of breath.   Cardiovascular:  Negative for chest pain.  Gastrointestinal:  Negative for abdominal pain, nausea and vomiting.  Genitourinary:  Negative for dysuria and hematuria.  Neurological:  Negative for headaches.  Psychiatric/Behavioral:  Positive for confusion. Negative for self-injury and suicidal ideas.   All other systems reviewed and are negative.  Physical Exam Updated Vital Signs BP (!) 145/82   Pulse 85   Temp 98.3 F (36.8 C) (Oral)   Resp 18   SpO2 98%   Physical Exam Vitals and nursing note reviewed.  Constitutional:      Appearance: Normal appearance. She is well-developed.  HENT:     Head: Normocephalic and atraumatic.     Comments: No tenderness to palpation of skull. No deformities or crepitus noted. No open wounds, abrasions or lacerations.  Eyes:     General: Lids are normal.     Conjunctiva/sclera: Conjunctivae normal.     Pupils: Pupils are equal, round, and reactive to light.     Comments: PERRL. EOMs intact. No nystagmus. No neglect.   Neck:     Comments: Full flexion/extension and lateral movement of neck fully intact. No bony midline tenderness. No deformities or crepitus.  Cardiovascular:     Rate and Rhythm: Normal rate and regular rhythm.     Pulses: Normal pulses.     Heart sounds: Normal heart sounds. No murmur heard.   No friction rub. No gallop.  Pulmonary:     Effort: Pulmonary effort is normal.     Breath sounds: Normal breath sounds.     Comments: Lungs clear to auscultation bilaterally.  Symmetric chest rise.  No wheezing,  rales, rhonchi. Abdominal:     Palpations: Abdomen is soft. Abdomen is not rigid.     Tenderness: There is no abdominal tenderness. There is no guarding.     Comments: Abdomen is soft, non-distended, non-tender. No rigidity, No guarding. No peritoneal signs.  Musculoskeletal:        General: Normal range of motion.     Cervical back: Full passive range of motion without pain.  Skin:    General: Skin is warm and dry.     Capillary Refill: Capillary refill takes less than 2 seconds.  Neurological:     Mental Status: She is alert and oriented to person, place,  and time.     Comments: Cranial nerves III-XII intact Follows commands, Moves all extremities  5/5 strength to BUE and BLE  Sensation intact throughout all major nerve distributions No slurred speech. No facial droop.  Alert and oriented x3  Psychiatric:        Speech: Speech normal.    ED Results / Procedures / Treatments   Labs (all labs ordered are listed, but only abnormal results are displayed) Labs Reviewed  COMPREHENSIVE METABOLIC PANEL - Abnormal; Notable for the following components:      Result Value   Total Protein 8.4 (*)    Total Bilirubin 1.3 (*)    All other components within normal limits  CBC WITH DIFFERENTIAL/PLATELET - Abnormal; Notable for the following components:   WBC 10.8 (*)    RBC 5.12 (*)    RDW 15.7 (*)    Neutro Abs 7.9 (*)    All other components within normal limits  SALICYLATE LEVEL - Abnormal; Notable for the following components:   Salicylate Lvl <7.8 (*)    All other components within normal limits  ACETAMINOPHEN LEVEL - Abnormal; Notable for the following components:   Acetaminophen (Tylenol), Serum <10 (*)    All other components within normal limits  URINALYSIS, ROUTINE W REFLEX MICROSCOPIC - Abnormal; Notable for the following components:   APPearance HAZY (*)    Ketones, ur 20 (*)    Leukocytes,Ua MODERATE (*)    Non Squamous Epithelial 0-5 (*)    All other components within  normal limits  URINE CULTURE  RESP PANEL BY RT-PCR (FLU A&B, COVID) ARPGX2  ETHANOL  RAPID URINE DRUG SCREEN, HOSP PERFORMED    EKG EKG Interpretation  Date/Time:  Sunday March 15 2021 15:39:45 EDT Ventricular Rate:  92 PR Interval:  194 QRS Duration: 76 QT Interval:  362 QTC Calculation: 447 R Axis:   -36 Text Interpretation: Sinus rhythm with occasional Premature ventricular complexes Left axis deviation Minimal voltage criteria for LVH, may be normal variant ( R in aVL ) Possible Inferior infarct , age undetermined Anterolateral infarct , age undetermined Abnormal ECG Since last tracing pvc new Otherwise no significant change Confirmed by Daleen Bo [phone]) on 03/15/2021 4:04:04 PM  Radiology CT Head Wo Contrast  Result Date: 03/15/2021 CLINICAL DATA:  Altered mental status. EXAM: CT HEAD WITHOUT CONTRAST TECHNIQUE: Contiguous axial images were obtained from the base of the skull through the vertex without intravenous contrast. COMPARISON:  Head CT and MRI brain February 14, 2021 FINDINGS: Brain: No significant parenchymal volume loss. No evidence of acute infarction, hemorrhage, hydrocephalus, extra-axial collection or mass lesion/mass effect. Vascular: No hyperdense vessel. Atherosclerotic calcifications of the internal carotid arteries at skull base. Skull: Normal. Negative for fracture or focal lesion. Sinuses/Orbits: Visualized portions of the paranasal sinuses and mastoid air cells are predominantly clear. Orbits are grossly unremarkable. Other: None IMPRESSION: No acute intracranial findings. Electronically Signed   By: Dahlia Bailiff MD   On: 03/15/2021 17:57    Procedures Procedures   Medications Ordered in ED Medications  acetaminophen (TYLENOL) tablet 1,000 mg (has no administration in time range)  amLODipine (NORVASC) tablet 10 mg (has no administration in time range)  cholecalciferol (VITAMIN D3) tablet 5,000 Units (has no administration in time range)  losartan (COZAAR)  tablet 50 mg (has no administration in time range)  hydrochlorothiazide (MICROZIDE) capsule 12.5 mg (has no administration in time range)  LORazepam (ATIVAN) injection 1 mg (1 mg Intravenous Given 03/15/21 2203)    ED Course  I have reviewed the triage vital signs and the nursing notes.  Pertinent labs & imaging results that were available during my care of the patient were reviewed by me and considered in my medical decision making (see chart for details).    MDM Rules/Calculators/A&P                          75 year old female brought in by daughter for concerns of continued paranoia, altered mental status.  She states has been ongoing for several weeks.  She reports that patient has been overly concerned about her money and her bank account as well as feeling like cameras are watching her.  On initial arrival, patient is afebrile, toxic appearing.  Vital signs are stable.  She can answer my questions but does tell me that she has been's concerned about some cameras watching her.  Denies any SI, HI.  She is ANO x3.  We will plan to check labs, CT head.  Acetaminophen, salicylate, ethanol unremarkable.  CMP shows normal BUN/creatinine.  CBC shows leukocytosis of 10.8.  Otherwise unremarkable.  Urine shows moderate leukocytes.  No bacteria.  Will send for urine culture.  CT head negative.  I did extensive discussion with the daughter.  Daughter states that she feels like things are not getting accomplished by the primary care doctor, outpatient neurology referrals.  She is concerned that this is continued and feels like there needs to be another solution.  She at this time does not feel like patient can go home.  I discussed with her at this time, patient does not meet any criteria for admission.  We will plan to have her evaluated by TTS as well as social work.  Daughter aware that patient may have to board overnight here in the ED.  TTS has been consulted and they recommend [facility] psych inpatient  treatment.  Updated daughter on plan. Patient placed Psych Hold.   Portions of this note were generated with Lobbyist. Dictation errors may occur despite best attempts at proofreading.   Final Clinical Impression(s) / ED Diagnoses Final diagnoses:  Paranoia [facility])  Agitation    Rx / DC Orders ED Discharge Orders     None        Desma Mcgregor 03/15/21 2342    Tegeler, Gwenyth Allegra, MD 03/16/21 [phone]
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
## 2021-03-16 ENCOUNTER — Emergency Department (HOSPITAL_COMMUNITY): Payer: Medicare Other

## 2021-03-16 LAB — RESP PANEL BY RT-PCR (FLU A&B, COVID) ARPGX2
Influenza A by PCR: NEGATIVE
Influenza B by PCR: NEGATIVE
SARS Coronavirus 2 by RT PCR: NEGATIVE

## 2021-03-16 MED ORDER — LORAZEPAM 2 MG/ML IJ SOLN
1.0000 mg | Freq: Once | INTRAMUSCULAR | Status: AC
  Administered 2021-03-16: 1 mg via INTRAVENOUS
  Filled 2021-03-16: qty 1

## 2021-03-16 MED ORDER — LORAZEPAM 1 MG PO TABS
1.0000 mg | ORAL_TABLET | Freq: Once | ORAL | Status: AC
  Administered 2021-03-17: 1 mg via ORAL
  Filled 2021-03-16: qty 1

## 2021-03-16 NOTE — ED Provider Notes (Signed)
Emergency Medicine Observation Re-evaluation Note  GABRIELLAH RABEL is a 75 y.o. female, seen on rounds today.  Pt initially presented to the ED for complaints of Altered Mental Status Currently, the patient is resting comfortably without complaints.  Physical Exam  BP 130/78   Pulse 87   Temp 98.3 F (36.8 C) (Oral)   Resp 16   SpO2 99%  Physical Exam General: No distress  Lungs: Resp even and unlabored Psych: Calm and cooperative  ED Course / MDM  EKG:EKG Interpretation  Date/Time:  Sunday March 15 2021 15:39:45 EDT Ventricular Rate:  92 PR Interval:  194 QRS Duration: 76 QT Interval:  362 QTC Calculation: 447 R Axis:   -36 Text Interpretation: Sinus rhythm with occasional Premature ventricular complexes Left axis deviation Minimal voltage criteria for LVH, may be normal variant ( R in aVL ) Possible Inferior infarct , age undetermined Anterolateral infarct , age undetermined Abnormal ECG Since last tracing pvc new Otherwise no significant change Confirmed by Daleen Bo [phone]) on 03/15/2021 4:04:04 PM  I have reviewed the labs performed to date as well as medications administered while in observation.  Recent changes in the last 24 hours include None.  Plan  Current plan is for Gero-psych admission. Patient is not under full IVC at this time.   Truddie Hidden, MD 03/16/21 [phone]

## 2021-03-16 NOTE — ED Notes (Signed)
Pt repeatedly getting up out of bed and becoming agitated. Redirected multiple times with no success. Informed provider and obtained order for 1mg  ativan IV. Discovered pt had pulled out IV, obtained approval to administer IM.

## 2021-03-16 NOTE — BH Assessment (Addendum)
[facility] Assessment Progress Note   Per Pecolia Ades, NP, this voluntary pt requires psychiatric hospitalization at a facility providing specialty care for geriatric patients at this time.  The following facilities have been contacted to seek placement for this pt, with results as noted:  Beds available, information sent, decision pending: [facility]  Unable to reach: Rosana Hoes (left message at 11:00)   At capacity: [facility] (unit currently closed) [facility] (unit currently closed) [facility]   If this voluntary pt is accepted to a facility, please discuss disposition with pt to be sure that she agrees to the plan.  If a facility agrees to accept pt and the plan changes in any way please call the facility to inform them of the change.  Final disposition is pending as of this writing.  Jalene Mullet, [place] Coordinator [phone]

## 2021-03-16 NOTE — BH Assessment (Signed)
Patient was seen this date by this writer to evaluate current mental health status. Patient contnues to be disorganized and delusional referring to this Probation officer as "that man she knew at church." Patient is oriented to person, situation although when asked in reference to place patient states "somewhere over there." Patient states it is "sometime in July" although is unaware of the year. Patient speaks in a low soft voice that is difficult to understand and talks at length about people "taking her money from her" although will not elaborate on content of statement. Patient denies any S/I, H/I although when asked in reference to [place] will not respond. Patient contnues to be disorganized and per [facility] NP still contnues to meet inpatient criteria as a placement Biochemist, clinical) is investigated. Status pending.

## 2021-03-16 NOTE — Discharge Planning (Signed)
[facility] is seeking post-discharge placement for this patient at the following level of care: geriatric psych hospital.

## 2021-03-17 LAB — URINE CULTURE

## 2021-03-17 MED ORDER — ONDANSETRON 4 MG PO TBDP
4.0000 mg | ORAL_TABLET | Freq: Once | ORAL | Status: AC
  Administered 2021-03-17: 4 mg via ORAL
  Filled 2021-03-17: qty 1

## 2021-03-17 NOTE — ED Provider Notes (Signed)
Emergency Medicine Observation Re-evaluation Note  Meghan Stanley is a 75 y.o. female, seen on rounds today.  Pt initially presented to the ED for complaints of Altered Mental Status Currently, the patient is resting comfortably.  Physical Exam  BP 109/69 (BP Location: Left Arm)   Pulse 89   Temp 97.9 F (36.6 C) (Oral)   Resp 18   SpO2 96%  Physical Exam General: No acute distress Cardiac: Well-perfused Lungs: Nonlabored Psych: Cooperative  ED Course / MDM  EKG:EKG Interpretation  Date/Time:  Sunday March 15 2021 15:39:45 EDT Ventricular Rate:  92 PR Interval:  194 QRS Duration: 76 QT Interval:  362 QTC Calculation: 447 R Axis:   -36 Text Interpretation: Sinus rhythm with occasional Premature ventricular complexes Left axis deviation Minimal voltage criteria for LVH, may be normal variant ( R in aVL ) Possible Inferior infarct , age undetermined Anterolateral infarct , age undetermined Abnormal ECG Since last tracing pvc new Otherwise no significant change Confirmed by Daleen Bo [phone]) on 03/15/2021 4:04:04 PM  I have reviewed the labs performed to date as well as medications administered while in observation.  Recent changes in the last 24 hours include BH H and TOC evaluations.  Plan  Current plan is for [facility] psych placement. Patient is not under full IVC at this time.   Hayden Rasmussen, MD 03/17/21 [phone]

## 2021-03-17 NOTE — ED Notes (Addendum)
Patient exhibiting bizarre behavior demonstrated by "syncopal" episode sitting on toilet.  Patient sat on toilet with body slumped over and arms dangling.  Staff attempted to encourage patient to ambulate without success.  Staff then physically carried patient and placed her in bed as patient refused to walk.  Patient then got out of bed and ambulated back to toilet unassisted.  Patient remained in restroom for 5 minutes and ambulated back to bed.

## 2021-03-17 NOTE — ED Notes (Signed)
Pt spouse at bedside. He asked about the plan for the pt treatment. I explained the current plan is for inpatient geropsychiatry placement, and we are waiting for an opening at a geropsych facility. He said, he would like for her to receive this treatment because "this is the 3rd time I've brought her here." Pt was lying in bed and speaking with her spouse while he was at bedside. At 7:38 AM pt ambulated to restroom and showered without assistance.

## 2021-03-17 NOTE — ED Notes (Signed)
Called pt spouse and let him know pt is enroute to [facility]

## 2021-03-17 NOTE — BH Assessment (Addendum)
[facility] Assessment Progress Note   Per Pecolia Ades, NP, this voluntary pt continues to require psychiatric hospitalization at a facility providing specialty care for geriatric patients at this time.  At [facility] calls from [place] to report that pt has been accepted to their facility by Gerrit Halls, MD.  Santiago Glad concurs with this decision as does EDP Aletta Edouard, MD, who also finds that pt meets criteria for IVC which he has initiated.  IVC documents have been faxed to [facility].  As of this writing, this writer has been unable to reach the magistrate to confirm receipt.  Will continue to call.  [place] Coordinator [phone]    Addendum:  At 15:16 Lynnea Ferrier confirms receipt.  She has since faxed Findings and Custody Order to this Probation officer.  [employer] then presented at [facility], completing Return of Service.  Pt's nurse, Joellen Jersey, has been notified, and she agrees to call report to [phone].  Pt is to be transported via [facility].  [place] Coordinator [phone]
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
## 2021-03-17 NOTE — BH Assessment (Signed)
[facility] Assessment Progress Note   Per Pecolia Ades, NP, this voluntary pt continues to require psychiatric hospitalization at a facility providing specialty care for geriatric patients at this time.  The following facilities have been contacted to seek placement for this pt, with results as noted:   Beds available, information sent, decision pending: [facility] (for possible beds tomorrow) [facility] (pending review from yesterday) [place] to reach: [facility] (follow up; left message at 10:03) [facility] (left message at 10:25)  Declined: [facility] (currently not accepting geriatric referrals)   At capacity: [facility] (unit currently closed) [facility] (unit currently closed) [facility] (all geriatric units under [employer])     If this voluntary pt is accepted to a facility, please discuss disposition with pt to be sure that she agrees to the plan.  If a facility agrees to accept pt and the plan changes in any way please call the facility to inform them of the change.  Final disposition is pending as of this writing.   Jalene Mullet, [place] Coordinator [phone]

## 2021-03-17 NOTE — Consult Note (Signed)
Meghan Stanley is a 75 y.o female seen face to face by this provider at [facility] ED. Patient reports she is at the hospital because "I was confused".  She reports that she just wants to sleep, lying in the hospital bed with her eyes closed.  Patient denies suicidal ideations, denies homicidal ideations, when I asked about auditory and visual hallucination she reported "I do not know "she said that she was having bad thoughts last night she was afraid that someone was going to get hurt.  She denies substance and alcohol use, reports that she lives with her daughter.  Nursing staff has reported bizarre behaviors, they reported that she is able to ambulate, and to feed herself.  Continue to recommend inpatient gero- psychiatric placement.

## 2021-03-17 NOTE — ED Notes (Signed)
Pt belonging bag returned. Staff assisted her with changing into the clothes she wore to the hospital.

## 2021-03-17 NOTE — BH Assessment (Signed)
Requested nursing Joellen Jersey, RN) to place TTS machine in patient's room for her re-assessment.

## 2021-03-24 ENCOUNTER — Ambulatory Visit: Payer: Medicare Other | Admitting: Family

## 2021-03-25 LAB — BASIC METABOLIC PANEL
BUN: 26 — AB (ref 4–21)
CO2: 17 (ref 13–22)
Chloride: 95 — AB (ref 99–108)
Creatinine: 1.4 — AB (ref 0.5–1.1)
Glucose: 72
Potassium: 4.4 (ref 3.4–5.3)
Sodium: 139 (ref 137–147)

## 2021-03-25 LAB — HEPATIC FUNCTION PANEL
ALT: 11 (ref 7–35)
AST: 21 (ref 13–35)
Alkaline Phosphatase: 83 (ref 25–125)

## 2021-03-25 LAB — LIPID PANEL
Cholesterol: 254 — AB (ref 0–200)
HDL: 55 (ref 35–70)
LDL Cholesterol: 175
LDl/HDL Ratio: 3.2
Triglycerides: 121 (ref 40–160)

## 2021-03-25 LAB — COMPREHENSIVE METABOLIC PANEL
Albumin: 4.3 (ref 3.5–5.0)
Calcium: 10.4 (ref 8.7–10.7)
Globulin: 3.4

## 2021-03-26 ENCOUNTER — Encounter: Payer: Self-pay | Admitting: Family

## 2021-04-01 ENCOUNTER — Other Ambulatory Visit: Payer: Self-pay | Admitting: Family

## 2021-04-01 DIAGNOSIS — F411 Generalized anxiety disorder: Secondary | ICD-10-CM

## 2021-04-06 ENCOUNTER — Encounter: Payer: Self-pay | Admitting: Family

## 2021-04-07 ENCOUNTER — Other Ambulatory Visit: Payer: Self-pay

## 2021-04-07 ENCOUNTER — Encounter: Payer: Self-pay | Admitting: Family

## 2021-04-07 ENCOUNTER — Ambulatory Visit (INDEPENDENT_AMBULATORY_CARE_PROVIDER_SITE_OTHER): Payer: Medicare Other | Admitting: Family

## 2021-04-07 VITALS — BP 118/70 | HR 84 | Temp 96.9°F | Ht 63.0 in | Wt 147.0 lb

## 2021-04-07 DIAGNOSIS — E782 Mixed hyperlipidemia: Secondary | ICD-10-CM

## 2021-04-07 DIAGNOSIS — E611 Iron deficiency: Secondary | ICD-10-CM

## 2021-04-07 DIAGNOSIS — E538 Deficiency of other specified B group vitamins: Secondary | ICD-10-CM

## 2021-04-07 DIAGNOSIS — R5383 Other fatigue: Secondary | ICD-10-CM | POA: Diagnosis not present

## 2021-04-07 DIAGNOSIS — F5105 Insomnia due to other mental disorder: Secondary | ICD-10-CM

## 2021-04-07 DIAGNOSIS — I10 Essential (primary) hypertension: Secondary | ICD-10-CM

## 2021-04-07 DIAGNOSIS — L602 Onychogryphosis: Secondary | ICD-10-CM

## 2021-04-07 DIAGNOSIS — R7303 Prediabetes: Secondary | ICD-10-CM

## 2021-04-07 DIAGNOSIS — F321 Major depressive disorder, single episode, moderate: Secondary | ICD-10-CM

## 2021-04-07 DIAGNOSIS — F99 Mental disorder, not otherwise specified: Secondary | ICD-10-CM

## 2021-04-07 MED ORDER — CYANOCOBALAMIN 1000 MCG/ML IJ SOLN
1000.0000 ug | Freq: Once | INTRAMUSCULAR | Status: AC
  Administered 2021-04-07: 1000 ug via INTRAMUSCULAR

## 2021-04-07 MED ORDER — MELATONIN 3 MG PO TABS: 3.0000 mg | ORAL_TABLET | Freq: Every day | ORAL | 0 refills | Status: DC

## 2021-04-07 NOTE — Progress Notes (Signed)
Provider: Marlowe Sax FNP-C  Caliah Kopke, Nelda Bucks, NP  Patient Care Team: Parrie Rasco, Nelda Bucks, NP as PCP - General (Family Medicine)  Extended Emergency Contact Information Primary Emergency Contact: Burnetta Sabin [place] Mobile Phone: [phone] Relation: Daughter Secondary Emergency Contact: Milton Ferguson [place] of [place] Phone: [phone] Relation: Spouse  Code Status:  DNR Goals of care: Advanced Directive information Advanced Directives 04/06/2021  Does Patient Have a Medical Advance Directive? No  Would patient like information on creating a medical advance directive? No - Patient declined     Chief Complaint  Patient presents with   Transitions Of Care    [facility] from [facility] 03/15/2021-03/17/2021. Patient c/o of being fatigued, states " I have no energy." Patient also c/o trouble breathing. FYI patient takes iron supplement infrequently. Patient states she is not taking crestor and has no idea how long she has been out of it, states " A pretty good while." Patient not taking zoloft, states she has not picked up a rx for that medication. Discuss starting b12 injections listed on medication list from [facility] medication sheet    HPI:  Pt is a 75 y.o. female seen today for follow up ED and [facility] admission from 03/15/2021 - 03/17/2021 post hospital visit for Paranoia.she was in ED 03/15/2021. Husband states paranoia has improved since discharged from behavioral Health  Fatigue - feels like has no energy since admission to behavioral health.Has vitamin B12 injection 1000 mcg to inject every 2 weeks on medication list from [facility].Husband states was told to get injection at the PCP  Lab work from behavioral health that husband brought in reviewed indicated Vitamin B12 630 ( 03/25/2021). RPR was negative.Hgb A1C 6.2   Husband describes breathing issues as having to stop due to right  arthritic hip pain and lower back pain.takes Tylenol for pain and wears back brace.   Not taking Crestor - has not taken her regular medication since discharged.   Has not pick up Zoloft ordered   Vit B12 inject listed from [facility] medication sheet   Arthritic pain on right hip.Has been exercising.takes tylenol for pain.    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[place])    Pure hypercholesterolemia    Scoliosis    Vitamin D deficiency    Past Surgical History:  Procedure Laterality Date   COLONOSCOPY N/A 09/29/2016   Procedure: COLONOSCOPY;  Surgeon: Irene Shipper, MD;  Location: [facility];  Service: Endoscopy;  Laterality: N/A;   ESOPHAGOGASTRODUODENOSCOPY N/A 09/29/2016   Procedure: ESOPHAGOGASTRODUODENOSCOPY (EGD);  Surgeon: Irene Shipper, MD;  Location[facility];  Service: Endoscopy;  Laterality: N/A;   NO PAST SURGERIES      No Known Allergies  Outpatient Encounter Medications as of 04/07/2021  Medication Sig   acetaminophen (TYLENOL) 500 MG tablet Take 1,000 mg by mouth every 6 (six) hours as needed (pain).   amLODipine (NORVASC) 10 MG tablet Take 10 mg by mouth every morning.   ARIPiprazole (ABILIFY) 15 MG tablet Take 15  mg by mouth daily. For psychosis   Cholecalciferol (VITAMIN D-3) 125 MCG (5000 UT) TABS Take 5,000 Units by mouth daily.   docusate sodium (COLACE) 100 MG capsule Take 100 mg by mouth daily as needed for mild constipation.   ferrous sulfate 325 (65 FE) MG tablet Take 325 mg by mouth daily with lunch.   fluvoxaMINE (LUVOX) 100 MG tablet Take 100 mg by mouth at bedtime.   latanoprost (XALATAN) 0.005 %  ophthalmic solution Place 1 drop into both eyes at bedtime.   losartan-hydrochlorothiazide (HYZAAR) 50-12.5 MG tablet Take 1 tablet by mouth at bedtime.   memantine (NAMENDA) 5 MG tablet Take 5 mg by mouth daily.   Menthol, Topical Analgesic, (BIOFREEZE EX) Apply 1 application topically daily as needed (arthritis/knee pain).   UNABLE TO FIND Med Name: Cholecalciferol 150 mcg daily for repletion   rosuvastatin (CRESTOR) 10 MG tablet Take 10 mg by mouth daily. (Patient not taking: Reported on 04/07/2021)   sertraline (ZOLOFT) 25 MG tablet Take 25 mg by mouth at bedtime. (Patient not taking: Reported on 04/07/2021)   [DISCONTINUED] sertraline (ZOLOFT) 25 MG tablet Take 1 tablet (25 mg total) by mouth daily.   No facility-administered encounter medications on file as of 04/07/2021.    Review of Systems  Constitutional:  Negative for appetite change, chills, fatigue, fever and unexpected weight change.  HENT:  Negative for congestion, dental problem, ear discharge, ear pain, facial swelling, hearing loss, nosebleeds, postnasal drip, rhinorrhea, sinus pressure, sinus pain, sneezing, sore throat, tinnitus and trouble swallowing.   Eyes:  Negative for pain, discharge, redness, itching and visual disturbance.  Respiratory:  Negative for cough, chest tightness, shortness of breath and wheezing.   Cardiovascular:  Negative for chest pain, palpitations and leg swelling.  Gastrointestinal:  Negative for abdominal distention, abdominal pain, blood in stool, constipation, diarrhea, nausea and vomiting.  Endocrine: Negative for cold intolerance, heat intolerance, polydipsia, polyphagia and polyuria.  Genitourinary:  Negative for difficulty urinating, dysuria, flank pain, frequency and urgency.  Musculoskeletal:  Negative for arthralgias, back pain, gait problem, joint swelling, myalgias, neck pain and neck stiffness.  Skin:  Negative for color change, pallor, rash and wound.  Neurological:  Negative for dizziness,  syncope, speech difficulty, weakness, light-headedness, numbness and headaches.  Hematological:  Does not bruise/bleed easily.  Psychiatric/Behavioral:  Positive for sleep disturbance. Negative for agitation, behavioral problems, confusion, hallucinations, self-injury and suicidal ideas. The patient is not nervous/anxious.    Immunization History  Administered Date(s) Administered   Influenza, Quadrivalent, Recombinant, Inj, Pf 10/09/2018   Influenza,inj,quad, With Preservative 10/02/2015   PFIZER(Purple Top)SARS-COV-2 Vaccination 11/26/2019, 12/17/2019, 09/03/2020   Pneumococcal Conjugate-13 10/02/2015   Pertinent  Health Maintenance Due  Topic Date Due   INFLUENZA VACCINE  04/06/2021   PNA vac Low Risk Adult (2 of 2 - PPSV23) 08/24/2021 (Originally 10/01/2016)   COLONOSCOPY (Pts 45-79yr Insurance coverage will need to be confirmed)  09/29/2026   DEXA SCAN  Completed   Fall Risk  04/07/2021 04/06/2021 03/10/2021  Falls in the past year? 0 0 0  Number falls in past yr: 0 0 0  Injury with Fall? 0 0 0  Risk for fall due to : No Fall Risks No Fall Risks No Fall Risks  Follow up Falls evaluation completed Falls evaluation completed Falls evaluation completed   Functional Status Survey:    Vitals:   04/07/21 1236  BP: 118/70  Pulse: 84  Temp: (!) 96.9 F (36.1 C)  TempSrc: Temporal  SpO2: 97%  Weight:  147 lb (66.7 kg)  Height: '5\' 3"'  (1.6 m)   Body mass index is 26.04 kg/m. Physical Exam Vitals reviewed.  Constitutional:      General: She is not in acute distress.    Appearance: Normal appearance. She is overweight. She is not ill-appearing or diaphoretic.  HENT:     Head: Normocephalic.     Mouth/Throat:     Mouth: Mucous membranes are moist.     Pharynx: Oropharynx is clear. No oropharyngeal exudate or posterior oropharyngeal erythema.  Eyes:     General: No scleral icterus.       Right eye: No discharge.        Left eye: No discharge.     Extraocular Movements:  Extraocular movements intact.     Conjunctiva/sclera: Conjunctivae normal.     Pupils: Pupils are equal, round, and reactive to light.  Neck:     Vascular: No carotid bruit.  Cardiovascular:     Rate and Rhythm: Normal rate and regular rhythm.     Pulses: Normal pulses.     Heart sounds: Normal heart sounds. No murmur heard.   No friction rub. No gallop.  Pulmonary:     Effort: Pulmonary effort is normal. No respiratory distress.     Breath sounds: Normal breath sounds. No wheezing, rhonchi or rales.  Chest:     Chest wall: No tenderness.  Abdominal:     General: Bowel sounds are normal. There is no distension.     Palpations: Abdomen is soft. There is no mass.     Tenderness: There is no abdominal tenderness. There is no right CVA tenderness, left CVA tenderness, guarding or rebound.  Musculoskeletal:        General: No swelling or tenderness. Normal range of motion.     Cervical back: Normal range of motion. No rigidity or tenderness.     Thoracic back: Scoliosis present.     Right lower leg: No edema.     Left lower leg: No edema.     Comments: Back brace in place removed during exam   Lymphadenopathy:     Cervical: No cervical adenopathy.  Skin:    General: Skin is warm and dry.     Coloration: Skin is not pale.     Findings: No bruising, erythema, lesion or rash.  Neurological:     Mental Status: She is alert.     Cranial Nerves: No cranial nerve deficit.     Sensory: No sensory deficit.     Motor: No weakness.     Coordination: Coordination normal.     Gait: Gait normal.     Comments: Memory lapse   Psychiatric:        Mood and Affect: Mood normal.        Speech: Speech normal.        Behavior: Behavior normal.        Thought Content: Thought content normal.        Judgment: Judgment normal.    Labs reviewed: Recent Labs    03/02/21 1112 03/11/21 0841 03/15/21 1620  NA 134* 136 136  K 4.1 4.1 3.6  CL 101 102 99  CO2 '25 24 26  ' GLUCOSE 116* 100* 85  BUN  '13 7 17  ' CREATININE 1.01* 0.68 0.90  CALCIUM 9.7 10.0 10.0   Recent Labs    02/26/21 1930 03/01/21 0131 03/11/21 0841 03/15/21 1620  AST '21 20 14 17  ' ALT '15 14 11 14  ' ALKPHOS 65 63  --  70  BILITOT 1.3* 0.9 0.7 1.3*  PROT 8.5* 7.8 6.9 8.4*  ALBUMIN 4.6 4.3  --  4.3   Recent Labs    03/01/21 0131 03/11/21 0841 03/15/21 1620  WBC 9.5 7.6 10.8*  NEUTROABS 7.1 5,297 7.9*  HGB 12.7 12.7 13.5  HCT 39.5 38.8 42.2  MCV 82.6 82.6 82.4  PLT 264 273 291   Lab Results  Component Value Date   TSH 1.70 03/11/2021   Lab Results  Component Value Date   HGBA1C 5.3 09/28/2016   Lab Results  Component Value Date   CHOL 239 (H) 03/11/2021   HDL 56 03/11/2021   LDLCALC 167 (H) 03/11/2021   TRIG 63 03/11/2021   CHOLHDL 4.3 03/11/2021    Significant Diagnostic Results in last 30 days:  DG Chest 2 View  Result Date: 03/16/2021 CLINICAL DATA:  Medical clearance for psychiatric placement. EXAM: CHEST - 2 VIEW COMPARISON:  03/01/2021. FINDINGS: Trachea is midline. Heart size stable. Mild chronic atelectasis at the left lung base, adjacent to an elevated left hemidiaphragm. Lungs are otherwise clear. No pleural fluid. IMPRESSION: No acute findings. Electronically Signed   By: Lorin Picket M.D.   On: 03/16/2021 10:50   CT Head Wo Contrast  Result Date: 03/15/2021 CLINICAL DATA:  Altered mental status. EXAM: CT HEAD WITHOUT CONTRAST TECHNIQUE: Contiguous axial images were obtained from the base of the skull through the vertex without intravenous contrast. COMPARISON:  Head CT and MRI brain February 14, 2021 FINDINGS: Brain: No significant parenchymal volume loss. No evidence of acute infarction, hemorrhage, hydrocephalus, extra-axial collection or mass lesion/mass effect. Vascular: No hyperdense vessel. Atherosclerotic calcifications of the internal carotid arteries at skull base. Skull: Normal. Negative for fracture or focal lesion. Sinuses/Orbits: Visualized portions of the paranasal sinuses  and mastoid air cells are predominantly clear. Orbits are grossly unremarkable. Other: None IMPRESSION: No acute intracranial findings. Electronically Signed   By: Dahlia Bailiff MD   On: 03/15/2021 17:57    Assessment/Plan  1. Fatigue, unspecified type Suspect due to vitamin B 12 def  Will rule out other acute and metabolic etiologies.  - CBC with Differential/Platelet - CMP with eGFR([facility]) - Vitamin B12  2. Vitamin B12 deficiency Per behavioral health med list ordered every two weeks though latest level was 640 (03/25/2021). Will change injection to monthly for now.  - cyanocobalamin ((VITAMIN B-12)) injection 1,000 mcg - Vitamin B 12  3. Insomnia due to other mental disorder Add melatonin as below. - melatonin 3 MG TABS tablet; Take 1 tablet (3 mg total) by mouth at bedtime.; Refill: 0  4. Prediabetes Hgb A1C 6.2 (03/25/2021) at behavioral Health Dietary modification and exercise advised.  - Hemoglobin A1c  5. Current moderate episode of major depressive disorder, unspecified whether recurrent ([place]) Symptoms have improved. - continue on Abilify,FluvoxaMine and sertraline  Continue to monitor for symptoms worsening depression.   6. Essential hypertension B/p well controlled  Continue on amlodipine and Hyzaar   7. Mixed hyperlipidemia LDL not at goal - advised to restart her Rosuvastatin   8. Iron deficiency Hgb Stable  Continue on Ferrous sulfate  - on colace to prevent constipation   9. Overgrown toenails Referred to Podiatrist on previous visit awaiting appointment.    Family/ staff Communication: Reviewed plan of care with patient and Husband Mr.Boran verbalized understanding.   Labs/tests ordered:  - CBC with Differential/Platelet - CMP with eGFR([facility]) - Vitamin B 12 - Hemoglobin A1C  Next Appointment: 6 months for medical management of chronic  issues. One month for vitamin B 12 injection.   Sandrea Hughs, NP

## 2021-04-08 LAB — COMPLETE METABOLIC PANEL WITH GFR
AG Ratio: 1.4 (calc) (ref 1.0–2.5)
ALT: 12 U/L (ref 6–29)
AST: 16 U/L (ref 10–35)
Albumin: 3.8 g/dL (ref 3.6–5.1)
Alkaline phosphatase (APISO): 64 U/L (ref 37–153)
BUN: 11 mg/dL (ref 7–25)
CO2: 26 mmol/L (ref 20–32)
Calcium: 9.7 mg/dL (ref 8.6–10.4)
Chloride: 100 mmol/L (ref 98–110)
Creat: 0.86 mg/dL (ref 0.60–1.00)
Globulin: 2.8 g/dL (calc) (ref 1.9–3.7)
Glucose, Bld: 81 mg/dL (ref 65–139)
Potassium: 4.3 mmol/L (ref 3.5–5.3)
Sodium: 136 mmol/L (ref 135–146)
Total Bilirubin: 0.6 mg/dL (ref 0.2–1.2)
Total Protein: 6.6 g/dL (ref 6.1–8.1)
eGFR: 70 mL/min/{1.73_m2} (ref >=60)

## 2021-04-08 LAB — CBC WITH DIFFERENTIAL/PLATELET
Absolute Monocytes: 566 cells/uL (ref 200–950)
Basophils Absolute: 61 cells/uL (ref 0–200)
Basophils Relative: 0.7 %
Eosinophils Absolute: 104 cells/uL (ref 15–500)
Eosinophils Relative: 1.2 %
HCT: 36.8 % (ref 35.0–45.0)
Hemoglobin: 11.9 g/dL (ref 11.7–15.5)
Lymphs Abs: 2062 cells/uL (ref 850–3900)
MCH: 26.7 pg — ABNORMAL LOW (ref 27.0–33.0)
MCHC: 32.3 g/dL (ref 32.0–36.0)
MCV: 82.5 fL (ref 80.0–100.0)
MPV: 11.5 fL (ref 7.5–12.5)
Monocytes Relative: 6.5 %
Neutro Abs: 5907 cells/uL (ref 1500–7800)
Neutrophils Relative %: 67.9 %
Platelets: 278 10*3/uL (ref 140–400)
RBC: 4.46 10*6/uL (ref 3.80–5.10)
RDW: 14.9 % (ref 11.0–15.0)
Total Lymphocyte: 23.7 %
WBC: 8.7 10*3/uL (ref 3.8–10.8)

## 2021-04-08 LAB — HEMOGLOBIN A1C
Hgb A1c MFr Bld: 5.9 % of total Hgb — ABNORMAL HIGH (ref <5.7)
Mean Plasma Glucose: 123 mg/dL
eAG (mmol/L): 6.8 mmol/L

## 2021-04-08 LAB — VITAMIN B12: Vitamin B-12: >2000 pg/mL — ABNORMAL HIGH (ref 200–1100)

## 2021-04-24 ENCOUNTER — Other Ambulatory Visit: Payer: Self-pay | Admitting: Internal Medicine

## 2021-04-24 ENCOUNTER — Other Ambulatory Visit: Payer: Self-pay | Admitting: Family

## 2021-05-12 ENCOUNTER — Ambulatory Visit (INDEPENDENT_AMBULATORY_CARE_PROVIDER_SITE_OTHER): Payer: Medicare Other

## 2021-05-12 ENCOUNTER — Other Ambulatory Visit: Payer: Self-pay

## 2021-05-12 ENCOUNTER — Telehealth: Payer: Self-pay | Admitting: *Deleted

## 2021-05-12 DIAGNOSIS — E538 Deficiency of other specified B group vitamins: Secondary | ICD-10-CM

## 2021-05-12 MED ORDER — LOSARTAN POTASSIUM-HCTZ 50-12.5 MG PO TABS: 1.0000 | ORAL_TABLET | Freq: Every day | ORAL | 1 refills | Status: DC

## 2021-05-12 MED ORDER — CYANOCOBALAMIN 1000 MCG/ML IJ SOLN
1000.0000 ug | Freq: Once | INTRAMUSCULAR | Status: AC
  Administered 2021-05-12: 1000 ug via INTRAMUSCULAR

## 2021-05-12 MED ORDER — MEMANTINE HCL 5 MG PO TABS: 5.0000 mg | ORAL_TABLET | Freq: Every day | ORAL | 1 refills | Status: DC

## 2021-05-12 NOTE — Telephone Encounter (Signed)
Schedule for monthly Vitamin B 12 injection.

## 2021-05-12 NOTE — Telephone Encounter (Signed)
Forwarded to the [facility]. Patient is coming in today at 1:45. To schedule next B12 in a month not 2 weeks.

## 2021-05-12 NOTE — Telephone Encounter (Signed)
Patient is scheduled to come in this afternoon for her Vitamin B12 Injection.   It stated that she gets it every 2 weeks in previous notes.   Do you still want patient to get the Vitamin B12 Injections every 2 weeks.  Please Advise.    04/07/2021 Lab: Result Notes Component Ref Range & Units 1 mo ago 2 mo ago 4 yr ago  Vitamin B-12 200 - 1,100 pg/mL >2,000 High   549  374 R, CM

## 2021-05-19 ENCOUNTER — Other Ambulatory Visit: Payer: Self-pay | Admitting: Internal Medicine

## 2021-05-22 ENCOUNTER — Other Ambulatory Visit: Payer: Self-pay | Admitting: Family

## 2021-05-23 ENCOUNTER — Other Ambulatory Visit: Payer: Self-pay | Admitting: Family

## 2021-06-01 ENCOUNTER — Other Ambulatory Visit: Payer: Self-pay | Admitting: *Deleted

## 2021-06-01 MED ORDER — LOSARTAN POTASSIUM-HCTZ 50-12.5 MG PO TABS: 1.0000 | ORAL_TABLET | Freq: Every day | ORAL | 1 refills | Status: DC

## 2021-06-01 MED ORDER — FLUVOXAMINE MALEATE 100 MG PO TABS: 100.0000 mg | ORAL_TABLET | Freq: Every day | ORAL | 1 refills | Status: DC

## 2021-06-01 NOTE — Telephone Encounter (Signed)
Patient husband requested refills.  Pended Rxs and sent to [facility] for approval.

## 2021-06-01 NOTE — Telephone Encounter (Signed)
Please sign refill request if you approve.

## 2021-06-15 ENCOUNTER — Other Ambulatory Visit: Payer: Self-pay

## 2021-06-15 ENCOUNTER — Ambulatory Visit (INDEPENDENT_AMBULATORY_CARE_PROVIDER_SITE_OTHER): Payer: Medicare Other

## 2021-06-15 DIAGNOSIS — E538 Deficiency of other specified B group vitamins: Secondary | ICD-10-CM | POA: Diagnosis not present

## 2021-06-15 MED ORDER — CYANOCOBALAMIN 1000 MCG/ML IJ SOLN
1000.0000 ug | Freq: Once | INTRAMUSCULAR | Status: AC
  Administered 2021-06-15: 1000 ug via INTRAMUSCULAR

## 2021-06-17 ENCOUNTER — Other Ambulatory Visit: Payer: Self-pay

## 2021-06-17 ENCOUNTER — Encounter: Payer: Self-pay | Admitting: Adult Health

## 2021-06-17 ENCOUNTER — Ambulatory Visit (INDEPENDENT_AMBULATORY_CARE_PROVIDER_SITE_OTHER): Payer: Medicare Other | Admitting: Adult Health

## 2021-06-17 VITALS — BP 130/80 | HR 110 | Temp 97.1°F | Resp 16 | Ht 63.0 in | Wt 156.6 lb

## 2021-06-17 DIAGNOSIS — R350 Frequency of micturition: Secondary | ICD-10-CM | POA: Diagnosis not present

## 2021-06-17 DIAGNOSIS — M549 Dorsalgia, unspecified: Secondary | ICD-10-CM

## 2021-06-17 LAB — POCT URINALYSIS DIPSTICK
Bilirubin, UA: POSITIVE
Glucose, UA: NEGATIVE
Ketones, UA: POSITIVE
Nitrite, UA: NEGATIVE
Protein, UA: NEGATIVE
Spec Grav, UA: 1.015 (ref 1.010–1.025)
Urobilinogen, UA: NEGATIVE E.U./dL — AB
pH, UA: 5 (ref 5.0–8.0)

## 2021-06-17 MED ORDER — SACCHAROMYCES BOULARDII 250 MG PO CAPS: 250.0000 mg | ORAL_CAPSULE | Freq: Two times a day (BID) | ORAL | 0 refills | Status: AC

## 2021-06-17 MED ORDER — CIPROFLOXACIN HCL 500 MG PO TABS: 500.0000 mg | ORAL_TABLET | Freq: Two times a day (BID) | ORAL | 0 refills | Status: AC

## 2021-06-17 NOTE — Progress Notes (Signed)
[facility] clinic  Provider:   Durenda Age  DNP  Code Status:  Full Code  Goals of Care:  Advanced Directives 06/17/2021  Does Patient Have a Medical Advance Directive? No  Would patient like information on creating a medical advance directive? No - Patient declined     Chief Complaint  Patient presents with   Acute Visit    Possible UTI    HPI: Patient is a 75 y.o. female seen today for an acute visit for pain on her right flank pain and increase in urinary frequency X 1 week. She denies having fever nor chills. She denies hematuria. She complains of having pressure on her suprapubic area.She is accompanied to the clinic by her husband. Urine dipstick showed urine is cloudy in appearance, bilirubin positive, blood moderate and leukocytes large (3+).   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[place])    Pure hypercholesterolemia    Scoliosis    Vitamin D deficiency     Past Surgical History:  Procedure Laterality Date   COLONOSCOPY N/A 09/29/2016   Procedure: COLONOSCOPY;  Surgeon: Irene Shipper, MD;  [facility];  Service: Endoscopy;  Laterality: N/A;   ESOPHAGOGASTRODUODENOSCOPY N/A 09/29/2016   Procedure: ESOPHAGOGASTRODUODENOSCOPY (EGD);  Surgeon: Irene Shipper, MD;  Location: [facility];  Service: Endoscopy;  Laterality: N/A;   NO PAST SURGERIES      No Known Allergies  Outpatient Encounter Medications as of 06/17/2021  Medication Sig   acetaminophen (TYLENOL) 500 MG tablet Take 1,000 mg by mouth every 6 (six) hours as needed (pain).   amLODipine (NORVASC) 10 MG tablet Take 10 mg by mouth every morning.   ARIPiprazole (ABILIFY) 15  MG tablet Take 15 mg by mouth daily. For psychosis   Cholecalciferol (VITAMIN D-3) 125 MCG (5000 UT) TABS Take 5,000 Units by mouth daily.   cyanocobalamin (,VITAMIN B-12,) 1000 MCG/ML injection Inject 1,000 mcg into the muscle every 30 (thirty) days.   docusate sodium (COLACE) 100 MG capsule Take 100 mg by mouth daily as needed for mild constipation.   ferrous sulfate 325 (65 FE) MG tablet Take 325 mg by mouth daily with lunch.   fluvoxaMINE (LUVOX) 100 MG tablet Take 1 tablet (100 mg total) by mouth at bedtime.   latanoprost (XALATAN) 0.005 % ophthalmic solution Place 1 drop into both eyes at bedtime.   losartan-hydrochlorothiazide (HYZAAR) 50-12.5 MG tablet Take 1 tablet by mouth at bedtime.   melatonin 3 MG TABS tablet Take 1 tablet (3 mg total) by mouth at bedtime.   memantine (NAMENDA) 5 MG tablet Take 1 tablet (5 mg total) by mouth daily.   Menthol, Topical Analgesic, (BIOFREEZE EX) Apply 1 application topically daily as needed (arthritis/knee pain).   rosuvastatin (CRESTOR) 10 MG tablet Take 10 mg by mouth daily.   sertraline (ZOLOFT) 25 MG tablet TAKE 1 TABLET (25 MG TOTAL) BY MOUTH DAILY.   UNABLE TO FIND Med Name: Cholecalciferol 150 mcg daily for repletion   No facility-administered encounter medications on file as of 06/17/2021.    Review of Systems:  Review of Systems  Constitutional:  Negative for activity change, appetite change, chills and fever.  HENT:  Negative for congestion.   Eyes: Negative.        Cataract on both eyes  Respiratory:  Negative for cough and shortness of breath.   Cardiovascular:  Negative for leg swelling.  Gastrointestinal:  Positive for nausea. Negative for constipation.  Genitourinary:  Positive for flank pain, frequency and urgency. Negative for decreased urine volume, difficulty urinating, dysuria and hematuria.       Dull pain on suprapubic area  Skin: Negative.   Neurological:  Negative for dizziness and light-headedness.   Psychiatric/Behavioral: Negative.     Health Maintenance  Topic Date Due   COVID-19 Vaccine (4 - Booster for Pfizer series) 01/02/2021   INFLUENZA VACCINE  04/06/2021   TETANUS/TDAP  08/24/2021 (Originally 02/04/1965)   Zoster Vaccines- Shingrix (1 of 2) 08/24/2021 (Originally 02/05/1996)   COLONOSCOPY (Pts 45-61yrs Insurance coverage will need to be confirmed)  09/29/2026   DEXA SCAN  Completed   Hepatitis C Screening  Completed   HPV VACCINES  Aged Out    Physical Exam: Vitals:   06/17/21 1549  BP: 130/80  Pulse: (!) 110  Resp: 16  Temp: (!) 97.1 F (36.2 C)  SpO2: 98%  Weight: 156 lb 9.6 oz (71 kg)  Height: 5\' 3"  (1.6 m)   Body mass index is 27.74 kg/m. Physical Exam Cardiovascular:     Rate and Rhythm: Normal rate and regular rhythm.     Pulses: Normal pulses.     Heart sounds: Normal heart sounds.  Pulmonary:     Effort: Pulmonary effort is normal.     Breath sounds: Normal breath sounds.  Abdominal:     General: Bowel sounds are normal.     Comments: +tenderness to right flank  Musculoskeletal:        General: Normal range of motion.     Right lower leg: No edema.     Left lower leg: No edema.  Skin:    General: Skin is warm.  Psychiatric:        Mood and Affect: Mood normal.        Behavior: Behavior normal.        Thought Content: Thought content normal.        Judgment: Judgment normal.    Labs reviewed: Basic Metabolic Panel: Recent Labs    03/11/21 0841 03/15/21 1620 03/25/21 0000 04/07/21 1352  NA 136 136 139 136  K 4.1 3.6 4.4 4.3  CL 102 99 95* 100  CO2 24 26 17 26   GLUCOSE 100* 85  --  81  BUN 7 17 26* 11  CREATININE 0.68 0.90 1.4* 0.86  CALCIUM 10.0 10.0 10.4 9.7  TSH 1.70  --   --   --    Liver Function Tests: Recent Labs    03/01/21 0131 03/11/21 0841 03/15/21 1620 03/25/21 0000 04/07/21 1352  AST 20 14 17 21 16   ALT 14 11 14 11 12   ALKPHOS 63  --  70 83  --   BILITOT 0.9 0.7 1.3*  --  0.6  PROT 7.8 6.9 8.4*  --  6.6   ALBUMIN 4.3  --  4.3 4.3  --    No results for input(s): LIPASE, AMYLASE in the last 8760 hours. No results for input(s): AMMONIA in the last 8760 hours. CBC: Recent Labs    03/11/21 0841 03/15/21 1620 04/07/21 1352  WBC 7.6 10.8* 8.7  NEUTROABS 5,297 7.9* 5,907  HGB  12.7 13.5 11.9  HCT 38.8 42.2 36.8  MCV 82.6 82.4 82.5  PLT 273 291 278   Lipid Panel: Recent Labs    03/11/21 0841 03/25/21 0000  CHOL 239* 254*  HDL 56 55  LDLCALC 167* 175  TRIG 63 121  CHOLHDL 4.3  --    Lab Results  Component Value Date   HGBA1C 5.9 (H) 04/07/2021    Procedures since last visit: No results found.  Assessment/Plan  1. Urinary frequency - POC Urinalysis Dipstick Urine dipstick showed urine is cloudy in appearance, bilirubin positive, blood moderate and leukocytes large (3+). - Urine Culture - ciprofloxacin (CIPRO) 500 MG tablet; Take 1 tablet (500 mg total) by mouth 2 (two) times daily for 5 days.  Dispense: 10 tablet; Refill: 0 - saccharomyces boulardii (FLORASTOR) 250 MG capsule; Take 1 capsule (250 mg total) by mouth 2 (two) times daily for 8 days.  Dispense: 16 capsule; Refill: 0  2. Back pain, unspecified back location, unspecified back pain laterality, unspecified chronicity - POC Urinalysis Dipstick - Urine Culture -  urine dipstick showed possible UTI as the cause of back pain -  will start on Cipro   Labs/tests ordered:  Urine dipstick and culture and sensitivity   Next appt:  07/21/2021

## 2021-06-17 NOTE — Patient Instructions (Signed)

## 2021-06-18 LAB — URINE CULTURE
MICRO NUMBER:: 12494880
SPECIMEN QUALITY:: ADEQUATE

## 2021-06-18 NOTE — Progress Notes (Signed)
Urine culture is negative for UTI. How is her urinary frequency and flank pain? If flank pain and urinary frequency is not resolved, will need to follow up in the clinic.

## 2021-07-16 DIAGNOSIS — H401131 Primary open-angle glaucoma, bilateral, mild stage: Secondary | ICD-10-CM | POA: Diagnosis not present

## 2021-07-16 DIAGNOSIS — H5213 Myopia, bilateral: Secondary | ICD-10-CM | POA: Diagnosis not present

## 2021-07-21 ENCOUNTER — Ambulatory Visit: Payer: Medicare Other

## 2021-07-27 ENCOUNTER — Other Ambulatory Visit: Payer: Self-pay

## 2021-07-27 ENCOUNTER — Ambulatory Visit (INDEPENDENT_AMBULATORY_CARE_PROVIDER_SITE_OTHER): Payer: Medicare Other | Admitting: *Deleted

## 2021-07-27 DIAGNOSIS — E538 Deficiency of other specified B group vitamins: Secondary | ICD-10-CM | POA: Diagnosis not present

## 2021-07-27 MED ORDER — CYANOCOBALAMIN 1000 MCG/ML IJ SOLN
1000.0000 ug | Freq: Once | INTRAMUSCULAR | Status: AC
  Administered 2021-07-27: 1000 ug via INTRAMUSCULAR

## 2021-07-28 ENCOUNTER — Encounter: Payer: Medicare Other | Admitting: Adult Health

## 2021-07-31 NOTE — Progress Notes (Signed)
This encounter was created in error - please disregard.

## 2021-08-24 ENCOUNTER — Other Ambulatory Visit: Payer: Self-pay | Admitting: Family

## 2021-08-25 DIAGNOSIS — H401131 Primary open-angle glaucoma, bilateral, mild stage: Secondary | ICD-10-CM | POA: Diagnosis not present

## 2021-09-01 ENCOUNTER — Ambulatory Visit (INDEPENDENT_AMBULATORY_CARE_PROVIDER_SITE_OTHER): Payer: Medicare Other

## 2021-09-01 ENCOUNTER — Other Ambulatory Visit: Payer: Self-pay

## 2021-09-01 DIAGNOSIS — E538 Deficiency of other specified B group vitamins: Secondary | ICD-10-CM | POA: Diagnosis not present

## 2021-09-01 MED ORDER — CYANOCOBALAMIN 1000 MCG/ML IJ SOLN
1000.0000 ug | Freq: Once | INTRAMUSCULAR | Status: AC
  Administered 2021-09-01: 14:00:00 1000 ug via INTRAMUSCULAR

## 2021-09-26 ENCOUNTER — Other Ambulatory Visit: Payer: Self-pay | Admitting: Family

## 2021-09-28 NOTE — Telephone Encounter (Signed)
Patient has request refill on medication "Zoloft 25mg ". Patient medication has Warnings. Medication pend and sent to PCP Ngetich, Nelda Bucks, NP for approval. Please Advise.

## 2021-10-06 ENCOUNTER — Encounter: Payer: Self-pay | Admitting: Family

## 2021-10-07 ENCOUNTER — Ambulatory Visit (INDEPENDENT_AMBULATORY_CARE_PROVIDER_SITE_OTHER): Payer: Medicare Other | Admitting: Family

## 2021-10-07 ENCOUNTER — Other Ambulatory Visit: Payer: Self-pay

## 2021-10-07 ENCOUNTER — Ambulatory Visit: Payer: Medicare Other | Admitting: Family

## 2021-10-07 ENCOUNTER — Encounter: Payer: Self-pay | Admitting: Family

## 2021-10-07 VITALS — BP 130/82 | HR 100 | Temp 96.6°F | Resp 16 | Ht 63.0 in | Wt 166.2 lb

## 2021-10-07 DIAGNOSIS — E782 Mixed hyperlipidemia: Secondary | ICD-10-CM | POA: Diagnosis not present

## 2021-10-07 DIAGNOSIS — F411 Generalized anxiety disorder: Secondary | ICD-10-CM

## 2021-10-07 DIAGNOSIS — M549 Dorsalgia, unspecified: Secondary | ICD-10-CM

## 2021-10-07 DIAGNOSIS — E538 Deficiency of other specified B group vitamins: Secondary | ICD-10-CM | POA: Diagnosis not present

## 2021-10-07 DIAGNOSIS — Z23 Encounter for immunization: Secondary | ICD-10-CM

## 2021-10-07 DIAGNOSIS — E611 Iron deficiency: Secondary | ICD-10-CM

## 2021-10-07 DIAGNOSIS — I10 Essential (primary) hypertension: Secondary | ICD-10-CM | POA: Diagnosis not present

## 2021-10-07 DIAGNOSIS — R7303 Prediabetes: Secondary | ICD-10-CM | POA: Diagnosis not present

## 2021-10-07 DIAGNOSIS — R350 Frequency of micturition: Secondary | ICD-10-CM

## 2021-10-07 LAB — POCT URINALYSIS DIPSTICK
Bilirubin, UA: NEGATIVE
Glucose, UA: NEGATIVE
Ketones, UA: POSITIVE
Nitrite, UA: NEGATIVE
Protein, UA: NEGATIVE
Spec Grav, UA: 1.025 (ref 1.010–1.025)
Urobilinogen, UA: NEGATIVE E.U./dL — AB
pH, UA: 5 (ref 5.0–8.0)

## 2021-10-07 MED ORDER — TETANUS-DIPHTH-ACELL PERTUSSIS 5-2.5-18.5 LF-MCG/0.5 IM SUSP: 0.5000 mL | Freq: Once | INTRAMUSCULAR | 0 refills | Status: AC

## 2021-10-07 MED ORDER — CYANOCOBALAMIN 1000 MCG/ML IJ SOLN
1000.0000 ug | Freq: Once | INTRAMUSCULAR | Status: AC
  Administered 2021-10-07: 1000 ug via INTRAMUSCULAR

## 2021-10-07 NOTE — Patient Instructions (Signed)
- please get your shingles and Tetanus vaccine at your pharmacy   [place] stands for Dietary Approaches to Stop Hypertension. The DASH eating plan is a healthy eating plan that has been shown to: Reduce high blood pressure (hypertension). Reduce your risk for type 2 diabetes, heart disease, and stroke. Help with weight loss. What are tips for following this plan? Reading food labels Check food labels for the amount of salt (sodium) per serving. Choose foods with less than 5 percent of the Daily Value of sodium. Generally, foods with less than 300 milligrams (mg) of sodium per serving fit into this eating plan. To find whole grains, look for the word "whole" as the first word in the ingredient list. Shopping Buy products labeled as "low-sodium" or "no salt added." Buy fresh foods. Avoid canned foods and pre-made or frozen meals. Cooking Avoid adding salt when cooking. Use salt-free seasonings or herbs instead of table salt or sea salt. Check with your health care provider or pharmacist before using salt substitutes. Do not fry foods. Cook foods using healthy methods such as baking, boiling, grilling, roasting, and broiling instead. Cook with heart-healthy oils, such as olive, canola, avocado, soybean, or sunflower oil. Meal planning  Eat a balanced diet that includes: 4 or more servings of fruits and 4 or more servings of vegetables each day. Try to fill one-half of your plate with fruits and vegetables. 6-8 servings of whole grains each day. Less than 6 oz (170 g) of lean meat, poultry, or fish each day. A 3-oz (85-g) serving of meat is about the same size as a deck of cards. One egg equals 1 oz (28 g). 2-3 servings of low-fat dairy each day. One serving is 1 cup (237 mL). 1 serving of nuts, seeds, or beans 5 times each week. 2-3 servings of heart-healthy fats. Healthy fats called omega-3 fatty acids are found in foods such as walnuts, flaxseeds, fortified milks, and eggs.  These fats are also found in cold-water fish, such as sardines, salmon, and mackerel. Limit how much you eat of: Canned or prepackaged foods. Food that is high in trans fat, such as some fried foods. Food that is high in saturated fat, such as fatty meat. Desserts and other sweets, sugary drinks, and other foods with added sugar. Full-fat dairy products. Do not salt foods before eating. Do not eat more than 4 egg yolks a week. Try to eat at least 2 vegetarian meals a week. Eat more home-cooked food and less restaurant, buffet, and fast food. Lifestyle When eating at a restaurant, ask that your food be prepared with less salt or no salt, if possible. If you drink alcohol: Limit how much you use to: 0-1 drink a day for women who are not pregnant. 0-2 drinks a day for men. Be aware of how much alcohol is in your drink. In the [place] one drink equals one 12 oz bottle of beer (355 mL), one 5 oz glass of wine (148 mL), or one 1 oz glass of hard liquor (44 mL). General information Avoid eating more than 2,300 mg of salt a day. If you have hypertension, you may need to reduce your sodium intake to 1,500 mg a day. Work with your health care provider to maintain a healthy body weight or to lose weight. Ask what an ideal weight is for you. Get at least 30 minutes of exercise that causes your heart to beat faster (aerobic exercise) most days of the week. Activities may include  walking, swimming, or biking. Work with your health care provider or dietitian to adjust your eating plan to your individual calorie needs. What foods should I eat? Fruits All fresh, dried, or frozen fruit. Canned fruit in natural juice (without added sugar). Vegetables Fresh or frozen vegetables (raw, steamed, roasted, or grilled). Low-sodium or reduced-sodium tomato and vegetable juice. Low-sodium or reduced-sodium tomato sauce and tomato paste. Low-sodium or reduced-sodium canned vegetables. Grains Whole-grain or  whole-wheat bread. Whole-grain or whole-wheat pasta. Brown rice. [place]. Bulgur. Whole-grain and low-sodium cereals. Pita bread. Low-fat, low-sodium crackers. Whole-wheat flour tortillas. Meats and other proteins Skinless chicken or [place]. Ground chicken or [place]. Pork with fat trimmed off. Fish and seafood. Egg whites. Dried beans, peas, or lentils. Unsalted nuts, nut butters, and seeds. Unsalted canned beans. Lean cuts of beef with fat trimmed off. Low-sodium, lean precooked or cured meat, such as sausages or meat loaves. Dairy Low-fat (1%) or fat-free (skim) milk. Reduced-fat, low-fat, or fat-free cheeses. Nonfat, low-sodium ricotta or cottage cheese. Low-fat or nonfat yogurt. Low-fat, low-sodium cheese. Fats and oils Soft margarine without trans fats. Vegetable oil. Reduced-fat, low-fat, or light mayonnaise and salad dressings (reduced-sodium). Canola, safflower, olive, avocado, soybean, and sunflower oils. Avocado. Seasonings and condiments Herbs. Spices. Seasoning mixes without salt. Other foods Unsalted popcorn and pretzels. Fat-free sweets. The items listed above may not be a complete list of foods and beverages you can eat. Contact a dietitian for more information. What foods should I avoid? Fruits Canned fruit in a light or heavy syrup. Fried fruit. Fruit in cream or butter sauce. Vegetables Creamed or fried vegetables. Vegetables in a cheese sauce. Regular canned vegetables (not low-sodium or reduced-sodium). Regular canned tomato sauce and paste (not low-sodium or reduced-sodium). Regular tomato and vegetable juice (not low-sodium or reduced-sodium). Angie Fava. Olives. Grains Baked goods made with fat, such as croissants, muffins, or some breads. Dry pasta or rice meal packs. Meats and other proteins Fatty cuts of meat. Ribs. Fried meat. Berniece Salines. Bologna, salami, and other precooked or cured meats, such as sausages or meat loaves. Fat from the back of a pig (fatback).  Bratwurst. Salted nuts and seeds. Canned beans with added salt. Canned or smoked fish. Whole eggs or egg yolks. Chicken or [place] with skin. Dairy Whole or 2% milk, cream, and half-and-half. Whole or full-fat cream cheese. Whole-fat or sweetened yogurt. Full-fat cheese. Nondairy creamers. Whipped toppings. Processed cheese and cheese spreads. Fats and oils Butter. Stick margarine. Lard. Shortening. Ghee. Bacon fat. Tropical oils, such as coconut, palm kernel, or palm oil. Seasonings and condiments Onion salt, garlic salt, seasoned salt, table salt, and sea salt. Worcestershire sauce. Tartar sauce. Barbecue sauce. Teriyaki sauce. Soy sauce, including reduced-sodium. Steak sauce. Canned and packaged gravies. Fish sauce. Oyster sauce. Cocktail sauce. Store-bought horseradish. Ketchup. Mustard. Meat flavorings and tenderizers. Bouillon cubes. Hot sauces. Pre-made or packaged marinades. Pre-made or packaged taco seasonings. Relishes. Regular salad dressings. Other foods Salted popcorn and pretzels. The items listed above may not be a complete list of foods and beverages you should avoid. Contact a dietitian for more information. Where to find more information National Heart, Lung, and Blood Institute: https://wilson-eaton.com/ American Heart Association: www.heart.org Academy of Nutrition and Dietetics: www.eatright.[place]: www.kidney.org Summary The DASH eating plan is a healthy eating plan that has been shown to reduce high blood pressure (hypertension). It may also reduce your risk for type 2 diabetes, heart disease, and stroke. When on the DASH eating plan, aim to eat more fresh fruits and  vegetables, whole grains, lean proteins, low-fat dairy, and heart-healthy fats. With the DASH eating plan, you should limit salt (sodium) intake to 2,300 mg a day. If you have hypertension, you may need to reduce your sodium intake to 1,500 mg a day. Work with your health care provider or  dietitian to adjust your eating plan to your individual calorie needs. This information is not intended to replace advice given to you by your health care provider. Make sure you discuss any questions you have with your health care provider. Document Revised: 07/27/2019 Document Reviewed: 07/27/2019 Elsevier Patient Education  2022 [employer]. .

## 2021-10-07 NOTE — Progress Notes (Signed)
Provider: Marlowe Sax FNP-C   Otha Monical, Nelda Bucks, NP  Patient Care Team: Bladen Umar, Nelda Bucks, NP as PCP - General (Family Medicine)  Extended Emergency Contact Information Primary Emergency Contact: Burnetta Sab[place] Mobile Phone: [phone] Relation: Daughter Secondary Emergency Contact: Zeoli,Edward  [place] Phone: [phone] Relation: Spouse  Code Status:  Full Code  Goals of care: Advanced Directive information Advanced Directives 10/06/2021  Does Patient Have a Medical Advance Directive? No  Would patient like information on creating a medical advance directive? No - Patient declined     Chief Complaint  Patient presents with   Medical Management of Chronic Issues    6 month follow up.   Injection     Vitamin B12 Injection   Concerns     Possible UTI   Immunizations    Discuss the need for Tetanus vaccine, Shingrix vaccine, Pne vaccine, Covid Booster, and Influenza vaccine.    HPI:  Pt is a 76 y.o. female seen today for 6 months follow up for medical management of chronic diseases.  She complains of lower abdominal pain and dysuria. She denies any fever,chills,nausea,vomiting,flank pain,urgency,frequency,difficult urination or hematuria,   Hyperlipidemia - not taking rosuvastatin   Sleeping well without taking melatonin. She is due for Tdap,shingles ,PNA ,Influenza vaccine and COVID-19 vaccine.    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[place])    Pure hypercholesterolemia    Scoliosis    Vitamin D deficiency    Past Surgical History:  Procedure Laterality Date   COLONOSCOPY N/A 09/29/2016    Procedure: COLONOSCOPY;  Surgeon: Irene Shipper, MD;  Location: [facility];  Service: Endoscopy;  Laterality: N/A;   ESOPHAGOGASTRODUODENOSCOPY N/A 09/29/2016   Procedure: ESOPHAGOGASTRODUODENOSCOPY (EGD);  Surgeon: Irene Shipper, MD;  Location: [facility];  Service: Endoscopy;  Laterality: N/A;   NO PAST SURGERIES      No Known Allergies  Allergies as of 10/07/2021   No Known Allergies      Medication List        Accurate as of October 07, 2021  3:26 PM. If you have any questions, ask your nurse or doctor.          STOP taking these medications    latanoprost 0.005 % ophthalmic solution Commonly known as: XALATAN Stopped by: Sandrea Hughs, NP       TAKE these medications    acetaminophen 500 MG tablet Commonly known as: TYLENOL Take 1,000 mg by mouth every 6 (six) hours as needed (pain).   amLODipine 10 MG tablet Commonly known as: NORVASC Take 10 mg by mouth every morning.   ARIPiprazole 15 MG tablet Commonly known as: ABILIFY Take 15 mg by mouth daily. For psychosis   BIOFREEZE EX Apply 1 application topically daily as needed (arthritis/knee pain).   cyanocobalamin 1000 MCG/ML injection Commonly known as: (VITAMIN B-12) Inject 1,000 mcg into the muscle every 30 (thirty) days.   docusate sodium 100 MG capsule Commonly known as: COLACE Take 100 mg by mouth daily as needed for mild constipation.  sulfate 325 (65 FE) MG tablet °Take 325 mg by mouth daily with lunch. °  °fluvoxaMINE 100 MG tablet °Commonly known as: LUVOX °Take 1 tablet (100 mg total) by mouth at bedtime. °  °losartan-hydrochlorothiazide 50-12.5 MG tablet °Commonly known as: HYZAAR °Take 1 tablet by mouth at bedtime. °  °melatonin 3 MG Tabs tablet °Take 1 tablet (3 mg total) by mouth at bedtime. °  °memantine 5 MG tablet °Commonly known as: NAMENDA °Take 1 tablet (5 mg total) by mouth daily. °  °Rocklatan 0.02-0.005 % Soln °Generic drug: Netarsudil-Latanoprost °Place 1 drop into  both eyes at bedtime. °  °rosuvastatin 10 MG tablet °Commonly known as: CRESTOR °Take 10 mg by mouth daily. °  °sertraline 25 MG tablet °Commonly known as: ZOLOFT °TAKE 1 TABLET (25 MG TOTAL) BY MOUTH DAILY. °  °UNABLE TO FIND °Med Name: Cholecalciferol 150 mcg daily for repletion °  °Vitamin D-3 125 MCG (5000 UT) Tabs °Take 5,000 Units by mouth daily. °  ° °  ° ° °Review of Systems  °Constitutional:  Negative for appetite change, chills, fatigue, fever and unexpected weight change.  °HENT:  Negative for congestion, dental problem, ear discharge, ear pain, facial swelling, hearing loss, nosebleeds, postnasal drip, rhinorrhea, sinus pressure, sinus pain, sneezing, sore throat, tinnitus and trouble swallowing.   °Eyes:  Negative for pain, discharge, redness, itching and visual disturbance.  °Respiratory:  Negative for cough, chest tightness, shortness of breath and wheezing.   °Cardiovascular:  Negative for chest pain, palpitations and leg swelling.  °Gastrointestinal:  Negative for abdominal distention, abdominal pain, blood in stool, constipation, diarrhea, nausea and vomiting.  °Endocrine: Negative for cold intolerance, heat intolerance, polydipsia, polyphagia and polyuria.  °Genitourinary:  Positive for flank pain and frequency. Negative for difficulty urinating, dysuria and urgency.  °Musculoskeletal:  Negative for gait problem, joint swelling, myalgias, neck pain and neck stiffness.  °     Flank back pain   °Skin:  Negative for color change, pallor, rash and wound.  °Neurological:  Negative for dizziness, syncope, speech difficulty, weakness, light-headedness, numbness and headaches.  °Hematological:  Does not bruise/bleed easily.  °Psychiatric/Behavioral:  Negative for agitation, behavioral problems, confusion, hallucinations and sleep disturbance. The patient is not nervous/anxious.   ° °Immunization History  °Administered Date(s) Administered  ° Influenza, Quadrivalent, Recombinant, Inj, Pf 10/09/2018  °  Influenza,inj,quad, With Preservative 10/02/2015  ° PFIZER(Purple Top)SARS-COV-2 Vaccination 11/26/2019, 12/17/2019, 09/03/2020  ° Pneumococcal Conjugate-13 10/02/2015  ° °Pertinent  Health Maintenance Due  °Topic Date Due  ° COLONOSCOPY (Pts 45-49yrs Insurance coverage will need to be confirmed)  09/29/2026  ° DEXA SCAN  Completed  ° INFLUENZA VACCINE  Discontinued  ° °Fall Risk 03/17/2021 04/06/2021 04/07/2021 06/17/2021 10/06/2021  °Falls in the past year? - 0 0 0 0  °Was there an injury with Fall? - 0 0 0 0  °Fall Risk Category Calculator - 0 0 0 0  °Fall Risk Category - Low Low Low Low  °Patient Fall Risk Level Low fall risk Low fall risk Low fall risk Low fall risk Low fall risk  °Patient at Risk for Falls Due to - No Fall Risks No Fall Risks No Fall Risks No Fall Risks  °Fall risk Follow up - Falls evaluation completed Falls evaluation completed Falls evaluation completed Falls evaluation completed  ° °Functional Status Survey: °  ° °Vitals:  ° 10/07/21 1511  °BP: 130/82  °Pulse: 100  °Resp: 16  °Temp: (!) 96.6 °F (35.9 °C)  °SpO2: 94%  °Weight: 166   lb 3.2 oz (75.4 kg)  °Height: 5' 3" (1.6 m)  ° °Body mass index is 29.44 kg/m². °Physical Exam °Vitals reviewed.  °Constitutional:   °   General: She is not in acute distress. °   Appearance: Normal appearance. She is normal weight. She is not ill-appearing or diaphoretic.  °HENT:  °   Head: Normocephalic.  °   Right Ear: Tympanic membrane, ear canal and external ear normal. There is no impacted cerumen.  °   Left Ear: Tympanic membrane, ear canal and external ear normal. There is no impacted cerumen.  °   Nose: Nose normal. No congestion or rhinorrhea.  °   Mouth/Throat:  °   Mouth: Mucous membranes are moist.  °   Pharynx: Oropharynx is clear. No oropharyngeal exudate or posterior oropharyngeal erythema.  °Eyes:  °   General: No scleral icterus.    °   Right eye: No discharge.     °   Left eye: No discharge.  °   Extraocular Movements: Extraocular movements intact.  °    Conjunctiva/sclera: Conjunctivae normal.  °   Pupils: Pupils are equal, round, and reactive to light.  °Neck:  °   Vascular: No carotid bruit.  °Cardiovascular:  °   Rate and Rhythm: Normal rate and regular rhythm.  °   Pulses: Normal pulses.  °   Heart sounds: Normal heart sounds. No murmur heard. °  No friction rub. No gallop.  °Pulmonary:  °   Effort: Pulmonary effort is normal. No respiratory distress.  °   Breath sounds: Normal breath sounds. No wheezing, rhonchi or rales.  °Chest:  °   Chest wall: No tenderness.  °Abdominal:  °   General: Bowel sounds are normal. There is no distension.  °   Palpations: Abdomen is soft. There is no mass.  °   Tenderness: There is no abdominal tenderness. There is no right CVA tenderness, left CVA tenderness, guarding or rebound.  °Musculoskeletal:     °   General: No swelling or tenderness. Normal range of motion.  °   Cervical back: Normal range of motion. No rigidity or tenderness.  °   Right lower leg: No edema.  °   Left lower leg: No edema.  °Lymphadenopathy:  °   Cervical: No cervical adenopathy.  °Skin: °   General: Skin is warm and dry.  °   Coloration: Skin is not pale.  °   Findings: No bruising, erythema, lesion or rash.  °Neurological:  °   Mental Status: She is alert and oriented to person, place, and time.  °   Cranial Nerves: No cranial nerve deficit.  °   Sensory: No sensory deficit.  °   Motor: No weakness.  °   Coordination: Coordination normal.  °   Gait: Gait normal.  °Psychiatric:     °   Mood and Affect: Mood normal.     °   Speech: Speech normal.     °   Behavior: Behavior normal.     °   Thought Content: Thought content normal.     °   Judgment: Judgment normal.  ° ° °Labs reviewed: °Recent Labs  °  03/11/21 °0841 03/15/21 °1620 03/25/21 °0000 04/07/21 °1352  °NA 136 136 139 136  °K 4.1 3.6 4.4 4.3  °CL 102 99 95* 100  °CO2 24 26 17 26  °GLUCOSE 100* 85  --  81  °BUN 7 17 26* 11  °CREATININE 0.68   0.90 1.4* 0.86  °CALCIUM 10.0 10.0 10.4 9.7  ° °Recent  Labs  °  03/01/21 °0131 03/11/21 °0841 03/15/21 °1620 03/25/21 °0000 04/07/21 °1352  °AST 20 14 17 21 16  °ALT 14 11 14 11 12  °ALKPHOS 63  --  70 83  --   °BILITOT 0.9 0.7 1.3*  --  0.6  °PROT 7.8 6.9 8.4*  --  6.6  °ALBUMIN 4.3  --  4.3 4.3  --   ° °Recent Labs  °  03/11/21 °0841 03/15/21 °1620 04/07/21 °1352  °WBC 7.6 10.8* 8.7  °NEUTROABS 5,297 7.9* 5,907  °HGB 12.7 13.5 11.9  °HCT 38.8 42.2 36.8  °MCV 82.6 82.4 82.5  °PLT 273 291 278  ° °Lab Results  °Component Value Date  ° TSH 1.70 03/11/2021  ° °Lab Results  °Component Value Date  ° HGBA1C 5.9 (H) 04/07/2021  ° °Lab Results  °Component Value Date  ° CHOL 254 (A) 03/25/2021  ° HDL 55 03/25/2021  ° LDLCALC 175 03/25/2021  ° TRIG 121 03/25/2021  ° CHOLHDL 4.3 03/11/2021  ° ° °Significant Diagnostic Results in last 30 days:  °No results found. ° °Assessment/Plan ° °1. Back pain, unspecified back location, unspecified back pain laterality, unspecified chronicity °Bilateral flank back pain will rule out UTI .No  CVA tenderness noted on exam.  °- POC Urinalysis Dipstick indicated yellow clear urine positive for ketones ,trace blood and moderate leukocytes but negative for nitrites.  °- Urine Culture ° °2. Urinary frequency °Afebrile  °- POC Urinalysis Dipstick °- Urine Culture ° °3. Vitamin B12 deficiency °Continue on monthly injection  °- cyanocobalamin ((VITAMIN B-12)) injection 1,000 mcg ° °4. Essential hypertension °B/p at goal  °Continue on amlodipine and losartan-hydrochlorothiazide  °- CBC with Differential/Platelet; Future °- CMP with eGFR([facility]); Future °- TSH; Future ° °5. Mixed hyperlipidemia °Continue rosuvastatin  °- Lipid panel; Future ° °6. Iron deficiency °Latest hgb 11.9  °Continue on ferrous sulfate  ° °7. Prediabetes °Lab Results  °Component Value Date  ° HGBA1C 5.9 (H) 04/07/2021  °Continue dietary modification and exercise  °- TSH; Future °- Hemoglobin A1c; Future ° °8. Generalized anxiety disorder °Stable  °Continue on sertraline  ° °9. Need  for Tdap vaccination °Advised to get Tdap vaccine at the pharmacy. Script send to pharmacy today °- Tdap (BOOSTRIX) 5-2.5-18.5 LF-MCG/0.5 injection; Inject 0.5 mLs into the muscle once for 1 dose.  Dispense: 0.5 mL; Refill: 0 ° °Family/ staff Communication: Reviewed plan of care with patient and daughter verbalized understanding. ° °Labs/tests ordered:  °- CBC with Differential/Platelet °- CMP with eGFR([facility]) °- TSH °- Hgb A1C °- Lipid panel ° °Next Appointment : 6 months for medical management of chronic issues.Fasting Labs in one week or sooner  ° ° ° C , NP ° ° °

## 2021-10-08 ENCOUNTER — Other Ambulatory Visit: Payer: Self-pay | Admitting: *Deleted

## 2021-10-08 LAB — URINE CULTURE
MICRO NUMBER:: 12950012
Result:: NO GROWTH
SPECIMEN QUALITY:: ADEQUATE

## 2021-10-08 MED ORDER — ROSUVASTATIN CALCIUM 10 MG PO TABS
10.0000 mg | ORAL_TABLET | Freq: Every day | ORAL | 1 refills | Status: DC
Start: 2021-10-08 — End: 2021-12-14

## 2021-10-08 NOTE — Telephone Encounter (Signed)
Patient husband called requesting refill for patient's cholesterol medication.  Pended Rx and sent to [facility] for approval.

## 2021-10-14 ENCOUNTER — Other Ambulatory Visit: Payer: Self-pay

## 2021-10-14 ENCOUNTER — Other Ambulatory Visit: Payer: Medicare Other

## 2021-10-14 DIAGNOSIS — E782 Mixed hyperlipidemia: Secondary | ICD-10-CM

## 2021-10-14 DIAGNOSIS — R7303 Prediabetes: Secondary | ICD-10-CM | POA: Diagnosis not present

## 2021-10-14 DIAGNOSIS — I10 Essential (primary) hypertension: Secondary | ICD-10-CM

## 2021-10-15 LAB — COMPLETE METABOLIC PANEL WITH GFR
AG Ratio: 1.4 (calc) (ref 1.0–2.5)
ALT: 11 U/L (ref 6–29)
AST: 16 U/L (ref 10–35)
Albumin: 4.4 g/dL (ref 3.6–5.1)
Alkaline phosphatase (APISO): 87 U/L (ref 37–153)
BUN: 25 mg/dL (ref 7–25)
CO2: 28 mmol/L (ref 20–32)
Calcium: 10.5 mg/dL — ABNORMAL HIGH (ref 8.6–10.4)
Chloride: 100 mmol/L (ref 98–110)
Creat: 0.86 mg/dL (ref 0.60–1.00)
Globulin: 3.2 g/dL (calc) (ref 1.9–3.7)
Glucose, Bld: 92 mg/dL (ref 65–99)
Potassium: 5.2 mmol/L (ref 3.5–5.3)
Sodium: 137 mmol/L (ref 135–146)
Total Bilirubin: 0.6 mg/dL (ref 0.2–1.2)
Total Protein: 7.6 g/dL (ref 6.1–8.1)
eGFR: 70 mL/min/{1.73_m2} (ref >=60)

## 2021-10-15 LAB — CBC WITH DIFFERENTIAL/PLATELET
Absolute Monocytes: 679 cells/uL (ref 200–950)
Basophils Absolute: 52 cells/uL (ref 0–200)
Basophils Relative: 0.6 %
Eosinophils Absolute: 112 cells/uL (ref 15–500)
Eosinophils Relative: 1.3 %
HCT: 41.4 % (ref 35.0–45.0)
Hemoglobin: 13.2 g/dL (ref 11.7–15.5)
Lymphs Abs: 2958 cells/uL (ref 850–3900)
MCH: 26 pg — ABNORMAL LOW (ref 27.0–33.0)
MCHC: 31.9 g/dL — ABNORMAL LOW (ref 32.0–36.0)
MCV: 81.5 fL (ref 80.0–100.0)
MPV: 12 fL (ref 7.5–12.5)
Monocytes Relative: 7.9 %
Neutro Abs: 4799 cells/uL (ref 1500–7800)
Neutrophils Relative %: 55.8 %
Platelets: 236 10*3/uL (ref 140–400)
RBC: 5.08 10*6/uL (ref 3.80–5.10)
RDW: 15.4 % — ABNORMAL HIGH (ref 11.0–15.0)
Total Lymphocyte: 34.4 %
WBC: 8.6 10*3/uL (ref 3.8–10.8)

## 2021-10-15 LAB — LIPID PANEL
Cholesterol: 192 mg/dL (ref <200)
HDL: 62 mg/dL (ref >=50)
LDL Cholesterol (Calc): 115 mg/dL (calc) — ABNORMAL HIGH
Non-HDL Cholesterol (Calc): 130 mg/dL (calc) — ABNORMAL HIGH (ref <130)
Total CHOL/HDL Ratio: 3.1 (calc) (ref <5.0)
Triglycerides: 49 mg/dL (ref <150)

## 2021-10-15 LAB — TSH: TSH: 2.71 mIU/L (ref 0.40–4.50)

## 2021-10-15 LAB — HEMOGLOBIN A1C
Hgb A1c MFr Bld: 6 % of total Hgb — ABNORMAL HIGH (ref <5.7)
Mean Plasma Glucose: 126 mg/dL
eAG (mmol/L): 7 mmol/L

## 2021-10-18 ENCOUNTER — Other Ambulatory Visit: Payer: Self-pay | Admitting: Family

## 2021-10-19 NOTE — Telephone Encounter (Signed)
Patient has request refill on mediation "Fluvoxamine 100mg ". Medication last refilled June 01, 2021. Patient medication has warnings. Medication pend and sent to PCP Ngetich, Nelda Bucks, NP for approval.

## 2021-10-21 ENCOUNTER — Other Ambulatory Visit: Payer: Self-pay

## 2021-10-21 DIAGNOSIS — R7303 Prediabetes: Secondary | ICD-10-CM

## 2021-10-21 DIAGNOSIS — I10 Essential (primary) hypertension: Secondary | ICD-10-CM

## 2021-10-21 DIAGNOSIS — E782 Mixed hyperlipidemia: Secondary | ICD-10-CM

## 2021-11-05 ENCOUNTER — Other Ambulatory Visit: Payer: Self-pay

## 2021-11-05 ENCOUNTER — Ambulatory Visit (INDEPENDENT_AMBULATORY_CARE_PROVIDER_SITE_OTHER): Payer: Medicare Other

## 2021-11-05 DIAGNOSIS — E538 Deficiency of other specified B group vitamins: Secondary | ICD-10-CM | POA: Diagnosis not present

## 2021-11-05 MED ORDER — CYANOCOBALAMIN 1000 MCG/ML IJ SOLN
1000.0000 ug | Freq: Once | INTRAMUSCULAR | Status: AC
  Administered 2021-11-05: 1000 ug via INTRAMUSCULAR

## 2021-12-01 DIAGNOSIS — H401131 Primary open-angle glaucoma, bilateral, mild stage: Secondary | ICD-10-CM | POA: Diagnosis not present

## 2021-12-09 ENCOUNTER — Other Ambulatory Visit: Payer: Self-pay | Admitting: Family

## 2021-12-09 ENCOUNTER — Ambulatory Visit (INDEPENDENT_AMBULATORY_CARE_PROVIDER_SITE_OTHER): Payer: Medicare Other | Admitting: *Deleted

## 2021-12-09 DIAGNOSIS — E538 Deficiency of other specified B group vitamins: Secondary | ICD-10-CM

## 2021-12-09 MED ORDER — CYANOCOBALAMIN 1000 MCG/ML IJ SOLN
1000.0000 ug | Freq: Once | INTRAMUSCULAR | Status: AC
  Administered 2021-12-09: 1000 ug via INTRAMUSCULAR

## 2021-12-14 ENCOUNTER — Other Ambulatory Visit: Payer: Self-pay | Admitting: Family

## 2021-12-14 MED ORDER — ROSUVASTATIN CALCIUM 10 MG PO TABS: 10.0000 mg | ORAL_TABLET | Freq: Every day | ORAL | 2 refills | Status: DC

## 2021-12-14 NOTE — Telephone Encounter (Signed)
Patient requested refill

## 2021-12-14 NOTE — Addendum Note (Signed)
Addended by: Rafael Bihari A on: 12/14/2021 11:50 AM ? ? Modules accepted: Orders ? ?

## 2022-01-04 ENCOUNTER — Other Ambulatory Visit: Payer: Self-pay | Admitting: Family

## 2022-01-08 ENCOUNTER — Encounter: Payer: Self-pay | Admitting: Internal Medicine

## 2022-01-13 ENCOUNTER — Ambulatory Visit (INDEPENDENT_AMBULATORY_CARE_PROVIDER_SITE_OTHER): Payer: Medicare Other

## 2022-01-13 DIAGNOSIS — E538 Deficiency of other specified B group vitamins: Secondary | ICD-10-CM | POA: Diagnosis not present

## 2022-01-13 MED ORDER — CYANOCOBALAMIN 1000 MCG/ML IJ SOLN
1000.0000 ug | Freq: Once | INTRAMUSCULAR | Status: AC
  Administered 2022-01-13: 1000 ug via INTRAMUSCULAR

## 2022-01-18 ENCOUNTER — Other Ambulatory Visit: Payer: Self-pay

## 2022-01-18 MED ORDER — AMLODIPINE BESYLATE 10 MG PO TABS: 10.0000 mg | ORAL_TABLET | Freq: Every morning | ORAL | 1 refills | Status: DC

## 2022-02-06 ENCOUNTER — Other Ambulatory Visit: Payer: Self-pay | Admitting: Family

## 2022-02-17 ENCOUNTER — Ambulatory Visit (INDEPENDENT_AMBULATORY_CARE_PROVIDER_SITE_OTHER): Payer: Medicare Other | Admitting: *Deleted

## 2022-02-17 DIAGNOSIS — E538 Deficiency of other specified B group vitamins: Secondary | ICD-10-CM | POA: Diagnosis not present

## 2022-02-17 MED ORDER — CYANOCOBALAMIN 1000 MCG/ML IJ SOLN
1000.0000 ug | Freq: Once | INTRAMUSCULAR | Status: AC
  Administered 2022-02-17: 1000 ug via INTRAMUSCULAR

## 2022-03-02 ENCOUNTER — Other Ambulatory Visit: Payer: Self-pay | Admitting: Family

## 2022-03-03 ENCOUNTER — Ambulatory Visit: Payer: Medicare Other

## 2022-03-19 DIAGNOSIS — H401131 Primary open-angle glaucoma, bilateral, mild stage: Secondary | ICD-10-CM | POA: Diagnosis not present

## 2022-03-19 DIAGNOSIS — H2513 Age-related nuclear cataract, bilateral: Secondary | ICD-10-CM | POA: Diagnosis not present

## 2022-03-19 DIAGNOSIS — H5213 Myopia, bilateral: Secondary | ICD-10-CM | POA: Diagnosis not present

## 2022-04-02 ENCOUNTER — Ambulatory Visit (INDEPENDENT_AMBULATORY_CARE_PROVIDER_SITE_OTHER): Payer: Medicare Other | Admitting: *Deleted

## 2022-04-02 ENCOUNTER — Other Ambulatory Visit (INDEPENDENT_AMBULATORY_CARE_PROVIDER_SITE_OTHER): Payer: Medicare Other

## 2022-04-02 DIAGNOSIS — I10 Essential (primary) hypertension: Secondary | ICD-10-CM | POA: Diagnosis not present

## 2022-04-02 DIAGNOSIS — E782 Mixed hyperlipidemia: Secondary | ICD-10-CM

## 2022-04-02 DIAGNOSIS — R7303 Prediabetes: Secondary | ICD-10-CM | POA: Diagnosis not present

## 2022-04-02 DIAGNOSIS — E538 Deficiency of other specified B group vitamins: Secondary | ICD-10-CM | POA: Diagnosis not present

## 2022-04-02 MED ORDER — CYANOCOBALAMIN 1000 MCG/ML IJ SOLN
1000.0000 ug | Freq: Once | INTRAMUSCULAR | Status: AC
  Administered 2022-04-02: 1000 ug via INTRAMUSCULAR

## 2022-04-03 LAB — BASIC METABOLIC PANEL WITH GFR
BUN: 18 mg/dL (ref 7–25)
CO2: 23 mmol/L (ref 20–32)
Calcium: 9.8 mg/dL (ref 8.6–10.4)
Chloride: 104 mmol/L (ref 98–110)
Creat: 0.88 mg/dL (ref 0.60–1.00)
Glucose, Bld: 91 mg/dL (ref 65–99)
Potassium: 5 mmol/L (ref 3.5–5.3)
Sodium: 138 mmol/L (ref 135–146)
eGFR: 68 mL/min/{1.73_m2} (ref >=60)

## 2022-04-03 LAB — CBC WITH DIFFERENTIAL/PLATELET
Absolute Monocytes: 548 cells/uL (ref 200–950)
Basophils Absolute: 50 cells/uL (ref 0–200)
Basophils Relative: 0.6 %
Eosinophils Absolute: 91 cells/uL (ref 15–500)
Eosinophils Relative: 1.1 %
HCT: 39.2 % (ref 35.0–45.0)
Hemoglobin: 12.5 g/dL (ref 11.7–15.5)
Lymphs Abs: 2631 cells/uL (ref 850–3900)
MCH: 26 pg — ABNORMAL LOW (ref 27.0–33.0)
MCHC: 31.9 g/dL — ABNORMAL LOW (ref 32.0–36.0)
MCV: 81.5 fL (ref 80.0–100.0)
MPV: 13 fL — ABNORMAL HIGH (ref 7.5–12.5)
Monocytes Relative: 6.6 %
Neutro Abs: 4980 cells/uL (ref 1500–7800)
Neutrophils Relative %: 60 %
Platelets: 241 10*3/uL (ref 140–400)
RBC: 4.81 10*6/uL (ref 3.80–5.10)
RDW: 15.1 % — ABNORMAL HIGH (ref 11.0–15.0)
Total Lymphocyte: 31.7 %
WBC: 8.3 10*3/uL (ref 3.8–10.8)

## 2022-04-03 LAB — LIPID PANEL
Cholesterol: 150 mg/dL (ref <200)
HDL: 56 mg/dL (ref >=50)
LDL Cholesterol (Calc): 82 mg/dL (calc)
Non-HDL Cholesterol (Calc): 94 mg/dL (calc) (ref <130)
Total CHOL/HDL Ratio: 2.7 (calc) (ref <5.0)
Triglycerides: 50 mg/dL (ref <150)

## 2022-04-03 LAB — HEMOGLOBIN A1C
Hgb A1c MFr Bld: 6 % of total Hgb — ABNORMAL HIGH (ref <5.7)
Mean Plasma Glucose: 126 mg/dL
eAG (mmol/L): 7 mmol/L

## 2022-04-07 ENCOUNTER — Ambulatory Visit: Payer: Medicare Other | Admitting: Family

## 2022-04-07 DIAGNOSIS — H2511 Age-related nuclear cataract, right eye: Secondary | ICD-10-CM | POA: Diagnosis not present

## 2022-04-07 DIAGNOSIS — H269 Unspecified cataract: Secondary | ICD-10-CM | POA: Diagnosis not present

## 2022-04-11 ENCOUNTER — Other Ambulatory Visit: Payer: Self-pay | Admitting: Family

## 2022-04-12 ENCOUNTER — Encounter: Payer: Self-pay | Admitting: Adult Health

## 2022-04-12 ENCOUNTER — Ambulatory Visit: Payer: Medicare Other | Admitting: Family

## 2022-04-12 ENCOUNTER — Ambulatory Visit (INDEPENDENT_AMBULATORY_CARE_PROVIDER_SITE_OTHER): Payer: Medicare Other | Admitting: Adult Health

## 2022-04-12 VITALS — BP 126/70 | HR 87 | Temp 97.4°F | Resp 18 | Ht 63.0 in | Wt 167.0 lb

## 2022-04-12 DIAGNOSIS — E782 Mixed hyperlipidemia: Secondary | ICD-10-CM

## 2022-04-12 DIAGNOSIS — F339 Major depressive disorder, recurrent, unspecified: Secondary | ICD-10-CM

## 2022-04-12 DIAGNOSIS — F5101 Primary insomnia: Secondary | ICD-10-CM | POA: Diagnosis not present

## 2022-04-12 DIAGNOSIS — F29 Unspecified psychosis not due to a substance or known physiological condition: Secondary | ICD-10-CM

## 2022-04-12 DIAGNOSIS — R7303 Prediabetes: Secondary | ICD-10-CM

## 2022-04-12 DIAGNOSIS — I10 Essential (primary) hypertension: Secondary | ICD-10-CM | POA: Diagnosis not present

## 2022-04-12 DIAGNOSIS — E538 Deficiency of other specified B group vitamins: Secondary | ICD-10-CM

## 2022-04-12 DIAGNOSIS — E611 Iron deficiency: Secondary | ICD-10-CM | POA: Diagnosis not present

## 2022-04-12 MED ORDER — ARIPIPRAZOLE 10 MG PO TABS: 10.0000 mg | ORAL_TABLET | Freq: Every day | ORAL | 3 refills | Status: DC

## 2022-04-12 NOTE — Progress Notes (Unsigned)
[facility]  Provider: Durenda Age DNP  Code Status:  Full Code  Goals of Care:     04/12/2022    2:14 PM  Advanced Directives  Does Patient Have a Medical Advance Directive? No  Would patient like information on creating a medical advance directive? No - Patient declined     Chief Complaint  Patient presents with   Medical Management of Chronic Issues    Patient is here for a 18M F/U    HPI: Patient is a 76 y.o. female seen today for a routine visit.  Essential hypertension -  average BP 128/70, takes Amlodipine and Hyzaar  Vitamin B12 deficiency -  takes Vitamin B12  Iron deficiency -  hgb is stable, takes iron daily  Psychosis, unspecified psychosis type (HCC) - no hallucinations nor paranoia, takes Abilify, wanting to know if medication can be reduced  Major depression, recurrent, chronic ([place]) -  feels mild depression, takes Sertraline and Luvox  Primary insomnia -   no episodes of insomnia   Past Medical History:  Diagnosis Date   Abdominal aortic aneurysm without rupture ([place])    noted on imaging 2018   Anemia    Arthritis    Atherosclerosis of aorta (HCC)    Colon polyps    GERD (gastroesophageal reflux disease)    Hemorrhoids    Hiatal hernia    HLD (hyperlipidemia)    Hypertension    Osteopenia    Osteopenia    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[place])    Pure hypercholesterolemia    Scoliosis    Vitamin D deficiency     Past Surgical History:  Procedure Laterality Date   COLONOSCOPY N/A 09/29/2016   Procedure: COLONOSCOPY;  Surgeon: Irene Shipper, MD;  Location: [facility];  Service: Endoscopy;  Laterality: N/A;   ESOPHAGOGASTRODUODENOSCOPY N/A 09/29/2016   Procedure: ESOPHAGOGASTRODUODENOSCOPY (EGD);  Surgeon: Irene Shipper, MD;  Location: [facility];  Service: Endoscopy;  Laterality: N/A;   NO PAST SURGERIES      No Known Allergies  Outpatient Encounter Medications as of 04/12/2022  Medication Sig   acetaminophen (TYLENOL) 500  MG tablet Take 1,000 mg by mouth every 6 (six) hours as needed (pain).   amLODipine (NORVASC) 10 MG tablet Take 1 tablet (10 mg total) by mouth every morning.   ARIPiprazole (ABILIFY) 10 MG tablet Take 1 tablet (10 mg total) by mouth daily.   Cholecalciferol (VITAMIN D-3) 125 MCG (5000 UT) TABS Take 5,000 Units by mouth daily.   cyanocobalamin (,VITAMIN B-12,) 1000 MCG/ML injection Inject 1,000 mcg into the muscle every 30 (thirty) days.   docusate sodium (COLACE) 100 MG capsule Take 100 mg by mouth daily as needed for mild constipation.   fluvoxaMINE (LUVOX) 100 MG tablet TAKE 1 TABLET BY MOUTH EVERYDAY AT BEDTIME   losartan-hydrochlorothiazide (HYZAAR) 50-12.5 MG tablet TAKE 1 TABLET BY MOUTH EVERYDAY AT BEDTIME   melatonin 3 MG TABS tablet Take 1 tablet (3 mg total) by mouth at bedtime.   memantine (NAMENDA) 5 MG tablet TAKE 1 TABLET (5 MG TOTAL) BY MOUTH DAILY.   Menthol, Topical Analgesic, (BIOFREEZE EX) Apply 1 application topically daily as needed (arthritis/knee pain).   ROCKLATAN 0.02-0.005 % SOLN Place 1 drop into both eyes at bedtime.   rosuvastatin (CRESTOR) 10 MG tablet Take 1 tablet (10 mg total) by mouth daily.   sertraline (ZOLOFT) 25 MG tablet TAKE 1 TABLET (25 MG TOTAL) BY MOUTH DAILY.   UNABLE TO FIND Med Name: Cholecalciferol 150 mcg daily  for repletion   [DISCONTINUED] ARIPiprazole (ABILIFY) 15 MG tablet Take 15 mg by mouth daily. For psychosis   [DISCONTINUED] ferrous sulfate 325 (65 FE) MG tablet Take 325 mg by mouth daily with lunch.   No facility-administered encounter medications on file as of 04/12/2022.    Review of Systems:  Review of Systems  Constitutional:  Negative for appetite change, chills, fatigue and fever.  HENT:  Negative for congestion, hearing loss, rhinorrhea and sore throat.   Eyes: Negative.   Respiratory:  Negative for cough, shortness of breath and wheezing.   Cardiovascular:  Negative for chest pain, palpitations and leg swelling.   Gastrointestinal:  Negative for abdominal pain, constipation, diarrhea, nausea and vomiting.  Genitourinary:  Negative for dysuria.  Musculoskeletal:  Negative for arthralgias, back pain and myalgias.  Skin:  Negative for color change, rash and wound.  Neurological:  Negative for dizziness, weakness and headaches.  Psychiatric/Behavioral:  Negative for behavioral problems. The patient is not nervous/anxious.     Health Maintenance  Topic Date Due   TETANUS/TDAP  Never done   Zoster Vaccines- Shingrix (1 of 2) Never done   Pneumonia Vaccine 69+ Years old (2 - PPSV23 or PCV20) 10/01/2016   COVID-19 Vaccine (4 - Pfizer series) 10/29/2020   DEXA SCAN  Completed   Hepatitis C Screening  Completed   HPV VACCINES  Aged Out   INFLUENZA VACCINE  Discontinued   COLONOSCOPY (Pts 45-31yr Insurance coverage will need to be confirmed)  Discontinued    Physical Exam: Vitals:   04/12/22 1421  BP: 126/70  Pulse: 87  Resp: 18  Temp: (!) 97.4 F (36.3 C)  TempSrc: Temporal  SpO2: 98%  Weight: 167 lb (75.8 kg)  Height: '5\' 3"'$  (1.6 m)   Body mass index is 29.58 kg/m. Physical Exam Constitutional:      Appearance: Normal appearance.  HENT:     Head: Normocephalic and atraumatic.     Nose: Nose normal.     Mouth/Throat:     Mouth: Mucous membranes are moist.  Eyes:     Conjunctiva/sclera: Conjunctivae normal.  Cardiovascular:     Rate and Rhythm: Normal rate and regular rhythm.  Pulmonary:     Effort: Pulmonary effort is normal.     Breath sounds: Normal breath sounds.  Abdominal:     General: Bowel sounds are normal.     Palpations: Abdomen is soft.  Musculoskeletal:        General: Normal range of motion.     Cervical back: Normal range of motion.  Skin:    General: Skin is warm and dry.  Neurological:     General: No focal deficit present.     Mental Status: She is alert and oriented to person, place, and time.  Psychiatric:        Mood and Affect: Mood normal.         Behavior: Behavior normal.        Thought Content: Thought content normal.        Judgment: Judgment normal.     Labs reviewed: Basic Metabolic Panel: Recent Labs    10/14/21 1002 04/02/22 0940  NA 137 138  K 5.2 5.0  CL 100 104  CO2 28 23  GLUCOSE 92 91  BUN 25 18  CREATININE 0.86 0.88  CALCIUM 10.5* 9.8  TSH 2.71  --    Liver Function Tests: Recent Labs    10/14/21 1002  AST 16  ALT 11  BILITOT 0.6  PROT  7.6   No results for input(s): "LIPASE", "AMYLASE" in the last 8760 hours. No results for input(s): "AMMONIA" in the last 8760 hours. CBC: Recent Labs    10/14/21 1002 04/02/22 0940  WBC 8.6 8.3  NEUTROABS 4,799 4,980  HGB 13.2 12.5  HCT 41.4 39.2  MCV 81.5 81.5  PLT 236 241   Lipid Panel: Recent Labs    10/14/21 1002 04/02/22 0940  CHOL 192 150  HDL 62 56  LDLCALC 115* 82  TRIG 49 50  CHOLHDL 3.1 2.7   Lab Results  Component Value Date   HGBA1C 6.0 (H) 04/02/2022    Procedures since last visit: No results found.  Assessment/Plan  1. Essential hypertension -  BP today 126/70, good -Blood pressure well controlled Continue current medications  2. Vitamin B12 deficiency -   continue B12 1,000 mcg IM mothly - check Vitamin B12  level  3. Iron deficiency Lab Results  Component Value Date   HGB 12.5 04/02/2022   -  hgb within normal level, discontinue Ferrous sulfate  4. Psychosis, unspecified psychosis type ([place]) -   no reported agitation nor hallucinations -  will decrease Abilify from 15 mg daily to 10 mg daily - ARIPiprazole (ABILIFY) 10 MG tablet; Take 1 tablet (10 mg total) by mouth daily.  Dispense: 30 tablet; Refill: 3  5. Major depression, recurrent, chronic (HCC) -  mood is stable, continue Fluvoxamine and Abilify  6. Primary insomnia -  continue Melatonin  7. Prediabetes Lab Results  Component Value Date   HGBA1C 6.0 (H) 04/02/2022   -   continue low carb diet   8.  Mixed hyperlipidemia Lab Results  Component  Value Date   CHOL 150 04/02/2022   HDL 56 04/02/2022   LDLCALC 82 04/02/2022   TRIG 50 04/02/2022   CHOLHDL 2.7 04/02/2022    -  at goal, continue Crestor    Labs/tests ordered:  Vitamin B12  Next appt:  05/03/2022

## 2022-04-12 NOTE — Patient Instructions (Signed)
Preventive Care 65 Years and Older, Female Preventive care refers to lifestyle choices and visits with your health care provider that can promote health and wellness. Preventive care visits are also called wellness exams. What can I expect for my preventive care visit? Counseling Your health care provider may ask you questions about your: Medical history, including: Past medical problems. Family medical history. Pregnancy and menstrual history. History of falls. Current health, including: Memory and ability to understand (cognition). Emotional well-being. Home life and relationship well-being. Sexual activity and sexual health. Lifestyle, including: Alcohol, nicotine or tobacco, and drug use. Access to firearms. Diet, exercise, and sleep habits. Work and work environment. Sunscreen use. Safety issues such as seatbelt and bike helmet use. Physical exam Your health care provider will check your: Height and weight. These may be used to calculate your BMI (body mass index). BMI is a measurement that tells if you are at a healthy weight. Waist circumference. This measures the distance around your waistline. This measurement also tells if you are at a healthy weight and may help predict your risk of certain diseases, such as type 2 diabetes and high blood pressure. Heart rate and blood pressure. Body temperature. Skin for abnormal spots. What immunizations do I need?  Vaccines are usually given at various ages, according to a schedule. Your health care provider will recommend vaccines for you based on your age, medical history, and lifestyle or other factors, such as travel or where you work. What tests do I need? Screening Your health care provider may recommend screening tests for certain conditions. This may include: Lipid and cholesterol levels. Hepatitis C test. Hepatitis B test. HIV (human immunodeficiency virus) test. STI (sexually transmitted infection) testing, if you are at  risk. Lung cancer screening. Colorectal cancer screening. Diabetes screening. This is done by checking your blood sugar (glucose) after you have not eaten for a while (fasting). Mammogram. Talk with your health care provider about how often you should have regular mammograms. BRCA-related cancer screening. This may be done if you have a family history of breast, ovarian, tubal, or peritoneal cancers. Bone density scan. This is done to screen for osteoporosis. Talk with your health care provider about your test results, treatment options, and if necessary, the need for more tests. Follow these instructions at home: Eating and drinking  Eat a diet that includes fresh fruits and vegetables, whole grains, lean protein, and low-fat dairy products. Limit your intake of foods with high amounts of sugar, saturated fats, and salt. Take vitamin and mineral supplements as recommended by your health care provider. Do not drink alcohol if your health care provider tells you not to drink. If you drink alcohol: Limit how much you have to 0-1 drink a day. Know how much alcohol is in your drink. In the [place] one drink equals one 12 oz bottle of beer (355 mL), one 5 oz glass of wine (148 mL), or one 1 oz glass of hard liquor (44 mL). Lifestyle Brush your teeth every morning and night with fluoride toothpaste. Floss one time each day. Exercise for at least 30 minutes 5 or more days each week. Do not use any products that contain nicotine or tobacco. These products include cigarettes, chewing tobacco, and vaping devices, such as e-cigarettes. If you need help quitting, ask your health care provider. Do not use drugs. If you are sexually active, practice safe sex. Use a condom or other form of protection in order to prevent STIs. Take aspirin only as told by   your health care provider. Make sure that you understand how much to take and what form to take. Work with your health care provider to find out whether it  is safe and beneficial for you to take aspirin daily. Ask your health care provider if you need to take a cholesterol-lowering medicine (statin). Find healthy ways to manage stress, such as: Meditation, yoga, or listening to music. Journaling. Talking to a trusted person. Spending time with friends and family. Minimize exposure to UV radiation to reduce your risk of skin cancer. Safety Always wear your seat belt while driving or riding in a vehicle. Do not drive: If you have been drinking alcohol. Do not ride with someone who has been drinking. When you are tired or distracted. While texting. If you have been using any mind-altering substances or drugs. Wear a helmet and other protective equipment during sports activities. If you have firearms in your house, make sure you follow all gun safety procedures. What's next? Visit your health care provider once a year for an annual wellness visit. Ask your health care provider how often you should have your eyes and teeth checked. Stay up to date on all vaccines. This information is not intended to replace advice given to you by your health care provider. Make sure you discuss any questions you have with your health care provider. Document Revised: 02/18/2021 Document Reviewed: 02/18/2021 Elsevier Patient Education  2023 [employer]  

## 2022-04-13 LAB — VITAMIN B12: Vitamin B-12: 1683 pg/mL — ABNORMAL HIGH (ref 200–1100)

## 2022-04-14 NOTE — Progress Notes (Signed)
Vitamin B12 level is within normal, continue taking medication monthly.

## 2022-04-28 DIAGNOSIS — H2512 Age-related nuclear cataract, left eye: Secondary | ICD-10-CM | POA: Diagnosis not present

## 2022-04-28 DIAGNOSIS — H269 Unspecified cataract: Secondary | ICD-10-CM | POA: Diagnosis not present

## 2022-05-03 ENCOUNTER — Ambulatory Visit: Payer: Medicare Other

## 2022-05-06 DIAGNOSIS — Z961 Presence of intraocular lens: Secondary | ICD-10-CM | POA: Diagnosis not present

## 2022-05-10 ENCOUNTER — Other Ambulatory Visit: Payer: Self-pay | Admitting: Adult Health

## 2022-05-10 DIAGNOSIS — F29 Unspecified psychosis not due to a substance or known physiological condition: Secondary | ICD-10-CM

## 2022-05-12 ENCOUNTER — Ambulatory Visit: Payer: Medicare Other

## 2022-05-19 ENCOUNTER — Ambulatory Visit: Payer: Medicare Other

## 2022-05-20 ENCOUNTER — Ambulatory Visit (INDEPENDENT_AMBULATORY_CARE_PROVIDER_SITE_OTHER): Payer: Medicare Other | Admitting: *Deleted

## 2022-05-20 DIAGNOSIS — E538 Deficiency of other specified B group vitamins: Secondary | ICD-10-CM | POA: Diagnosis not present

## 2022-05-20 MED ORDER — CYANOCOBALAMIN 1000 MCG/ML IJ SOLN
1000.0000 ug | Freq: Once | INTRAMUSCULAR | Status: AC
  Administered 2022-05-20: 1000 ug via INTRAMUSCULAR

## 2022-05-21 ENCOUNTER — Encounter: Payer: Medicare Other | Admitting: Nurse Practitioner

## 2022-05-24 NOTE — Progress Notes (Signed)
Err

## 2022-06-03 DIAGNOSIS — Z961 Presence of intraocular lens: Secondary | ICD-10-CM | POA: Diagnosis not present

## 2022-06-07 ENCOUNTER — Other Ambulatory Visit: Payer: Self-pay | Admitting: Adult Health

## 2022-06-07 DIAGNOSIS — F29 Unspecified psychosis not due to a substance or known physiological condition: Secondary | ICD-10-CM

## 2022-06-14 ENCOUNTER — Other Ambulatory Visit: Payer: Self-pay | Admitting: Family

## 2022-07-13 ENCOUNTER — Encounter: Payer: Self-pay | Admitting: Family

## 2022-07-14 ENCOUNTER — Encounter: Payer: Self-pay | Admitting: Family

## 2022-07-14 ENCOUNTER — Ambulatory Visit (INDEPENDENT_AMBULATORY_CARE_PROVIDER_SITE_OTHER): Payer: Medicare Other | Admitting: Family

## 2022-07-14 VITALS — BP 132/90 | HR 89 | Temp 97.5°F | Resp 18 | Ht 60.0 in | Wt 162.0 lb

## 2022-07-14 DIAGNOSIS — L602 Onychogryphosis: Secondary | ICD-10-CM

## 2022-07-14 DIAGNOSIS — E559 Vitamin D deficiency, unspecified: Secondary | ICD-10-CM | POA: Diagnosis not present

## 2022-07-14 DIAGNOSIS — E782 Mixed hyperlipidemia: Secondary | ICD-10-CM

## 2022-07-14 DIAGNOSIS — F29 Unspecified psychosis not due to a substance or known physiological condition: Secondary | ICD-10-CM

## 2022-07-14 DIAGNOSIS — F339 Major depressive disorder, recurrent, unspecified: Secondary | ICD-10-CM

## 2022-07-14 DIAGNOSIS — E538 Deficiency of other specified B group vitamins: Secondary | ICD-10-CM

## 2022-07-14 DIAGNOSIS — I10 Essential (primary) hypertension: Secondary | ICD-10-CM | POA: Diagnosis not present

## 2022-07-14 DIAGNOSIS — F411 Generalized anxiety disorder: Secondary | ICD-10-CM

## 2022-07-14 DIAGNOSIS — M549 Dorsalgia, unspecified: Secondary | ICD-10-CM | POA: Diagnosis not present

## 2022-07-14 DIAGNOSIS — Z23 Encounter for immunization: Secondary | ICD-10-CM

## 2022-07-14 DIAGNOSIS — R7303 Prediabetes: Secondary | ICD-10-CM

## 2022-07-14 MED ORDER — ARIPIPRAZOLE 5 MG PO TABS: 5.0000 mg | ORAL_TABLET | Freq: Every day | ORAL | 0 refills | Status: DC

## 2022-07-14 MED ORDER — CYANOCOBALAMIN 1000 MCG/ML IJ SOLN
1000.0000 ug | Freq: Once | INTRAMUSCULAR | Status: AC
  Administered 2022-07-14: 1000 ug via INTRAMUSCULAR

## 2022-07-14 NOTE — Patient Instructions (Addendum)
-   Please get your shingles,Tetanus and COVID-19 vaccine at the pharmacy   - Reduce Abilify from 10 mg tablet to 5 mg tablet daily for 2 weeks then discontinue.

## 2022-07-14 NOTE — Progress Notes (Signed)
Provider: Marlowe Sax FNP-C   Hoy Fallert, Nelda Bucks, NP  Patient Care Team: Reegan Mctighe, Nelda Bucks, NP as PCP - General (Family Medicine)  Extended Emergency Contact Information Primary Emergency Contact: Burnetta Sab[place] Mobile Phone: [phone] Relation: Daughter Secondary Emergency Contact: Vacca,Edward  [place] Phone: [phone] Relation: Spouse  Code Status:  Full Code  Goals of care: Advanced Directive information    04/12/2022    2:14 PM  Advanced Directives  Does Patient Have a Medical Advance Directive? No  Would patient like information on creating a medical advance directive? No - Patient declined     Chief Complaint  Patient presents with   Medical Management of Chronic Issues    Patient is here for a 32mF/U for chronic conditions. Pt would like to change medication Abilify, Pt would like vitamin B 12 injections.    HPI:  Pt is a 76y.o. female seen today for 6 months follow up for medical management of chronic diseases.she is here with Husband who provides additional HPI information. States due for vitamin B 12 injection.patient missed her previous appointment " did not feel like coming out for the shot".    Left knee pain - uses Biofreeze as needed.wears a knee brace.rates pain 5 on scale 10.declines refer to Orthopedic for evaluation.   Lower back pain - wears back brace while up and off at bedtime.states scoliosis contributing to her back pain.   Dementia - daughter and husband help with cooking.still does own showering. Husband states patient has no agitation request Abilify to be discontinued makes patient sleepy throughout the day. She was seen by MGenia PlantsNP 06/17/2022 Abilify was reduced from 15 mg tablet to 10 mg tablet daily.   Hypertension - no home B/p readings for review. On losartan- HCZT and Amlodipine   Has had  5 lbs weight loss since last visit.states eats at least two meals daily and  snacks. Hyperlipidemia - latest LDL was 115 with normal total cholesterol and TRG. Currently on rosuvastatin.  Past Medical History:  Diagnosis Date   Abdominal aortic aneurysm without rupture ([facility]    noted on imaging 2018   Anemia    Arthritis    Atherosclerosis of aorta (HCC)    Colon polyps    GERD (gastroesophageal reflux disease)    Hemorrhoids    Hiatal hernia    HLD (hyperlipidemia)    Hypertension    Osteopenia    Osteopenia    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    Pure hypercholesterolemia    Scoliosis    Vitamin D deficiency    Past Surgical History:  Procedure Laterality Date   COLONOSCOPY N/A 09/29/2016   Procedure: COLONOSCOPY;  Surgeon: JIrene Shipper MD;  Location: [facility];  Service: Endoscopy;  Laterality: N/A;   ESOPHAGOGASTRODUODENOSCOPY N/A 09/29/2016   Procedure: ESOPHAGOGASTRODUODENOSCOPY (EGD);  Surgeon: JIrene Shipper MD;  [facility];  Service: Endoscopy;  Laterality: N/A;   NO PAST SURGERIES      No Known Allergies  Allergies as of 07/14/2022   No Known Allergies      Medication List        Accurate as of July 14, 2022  3:38 PM. If you have any questions, ask your nurse or doctor.          acetaminophen 500 MG tablet Commonly known as: TYLENOL Take 1,000 mg by mouth every 6 (six) hours as needed (pain).   amLODipine 10 MG tablet Commonly  known as: NORVASC Take 1 tablet (10 mg total) by mouth every morning.   ARIPiprazole 10 MG tablet Commonly known as: ABILIFY TAKE 1 TABLET BY MOUTH EVERY DAY   BIOFREEZE EX Apply 1 application topically daily as needed (arthritis/knee pain).   cyanocobalamin 1000 MCG/ML injection Commonly known as: VITAMIN B12 Inject 1,000 mcg into the muscle every 30 (thirty) days.   docusate sodium 100 MG capsule Commonly known as: COLACE Take 100 mg by mouth daily as needed for mild constipation.   fluvoxaMINE 100 MG tablet Commonly known as: LUVOX TAKE 1 TABLET BY MOUTH EVERYDAY  AT BEDTIME   losartan-hydrochlorothiazide 50-12.5 MG tablet Commonly known as: HYZAAR TAKE 1 TABLET BY MOUTH EVERYDAY AT BEDTIME   melatonin 3 MG Tabs tablet Take 1 tablet (3 mg total) by mouth at bedtime.   memantine 5 MG tablet Commonly known as: NAMENDA TAKE 1 TABLET (5 MG TOTAL) BY MOUTH DAILY.   Rocklatan 0.02-0.005 % Soln Generic drug: Netarsudil-Latanoprost Place 1 drop into both eyes at bedtime.   rosuvastatin 10 MG tablet Commonly known as: CRESTOR Take 1 tablet (10 mg total) by mouth daily.   sertraline 25 MG tablet Commonly known as: ZOLOFT TAKE 1 TABLET (25 MG TOTAL) BY MOUTH DAILY.   UNABLE TO FIND Med Name: Cholecalciferol 150 mcg daily for repletion   Vitamin D-3 125 MCG (5000 UT) Tabs Take 5,000 Units by mouth daily.        Review of Systems  Constitutional:  Negative for appetite change, chills, fatigue, fever and unexpected weight change.  HENT:  Negative for congestion, dental problem, ear discharge, ear pain, facial swelling, hearing loss, nosebleeds, postnasal drip, rhinorrhea, sinus pressure, sinus pain, sneezing, sore throat, tinnitus and trouble swallowing.   Eyes:  Negative for pain, discharge, redness, itching and visual disturbance.  Respiratory:  Negative for cough, chest tightness, shortness of breath and wheezing.   Cardiovascular:  Negative for chest pain, palpitations and leg swelling.  Gastrointestinal:  Negative for abdominal distention, abdominal pain, blood in stool, constipation, diarrhea, nausea and vomiting.  Endocrine: Negative for cold intolerance, heat intolerance, polydipsia, polyphagia and polyuria.  Genitourinary:  Negative for difficulty urinating, dysuria, flank pain, frequency and urgency.  Musculoskeletal:  Positive for arthralgias, back pain and gait problem. Negative for joint swelling, myalgias, neck pain and neck stiffness.  Skin:  Negative for color change, pallor, rash and wound.  Neurological:  Negative for  dizziness, syncope, speech difficulty, weakness, light-headedness, numbness and headaches.  Hematological:  Does not bruise/bleed easily.  Psychiatric/Behavioral:  Negative for agitation, behavioral problems, confusion, hallucinations, self-injury, sleep disturbance and suicidal ideas. The patient is not nervous/anxious.        Husband states patient has no agitation request Abilify to be discontinued makes patient sleepy throughout the day.     Immunization History  Administered Date(s) Administered   Influenza, Quadrivalent, Recombinant, Inj, Pf 10/09/2018   Influenza,inj,quad, With Preservative 10/02/2015   PFIZER(Purple Top)SARS-COV-2 Vaccination 11/26/2019, 12/17/2019, 09/03/2020   Pneumococcal Conjugate-13 10/02/2015   Pertinent  Health Maintenance Due  Topic Date Due   DEXA SCAN  Completed   INFLUENZA VACCINE  Discontinued   COLONOSCOPY (Pts 45-15yr Insurance coverage will need to be confirmed)  Discontinued      04/06/2021    4:53 PM 04/07/2021   12:38 PM 06/17/2021    3:26 PM 10/06/2021    4:31 PM 04/12/2022    2:14 PM  Fall Risk  Falls in the past year? 0 0 0 0 0  Was  there an injury with Fall? 0 0 0 0 0  Fall Risk Category Calculator 0 0 0 0 0  Fall Risk Category _0   Patient Fall Risk Level _1   Patient at Risk for Falls Due to _2   Fall risk Follow up _3    Functional Status Survey:    Vitals:   07/14/22 1500  BP: (!) 132/90  Pulse: 89  Resp: 18  Temp: (!) 97.5 F (36.4 C)  SpO2: 93%  Weight: 162 lb (73.5 kg)  Height: 5' (1.524 m)   Body mass index is 31.64 kg/m. Physical Exam Vitals reviewed.  Constitutional:      General: She is not in acute distress.    Appearance: Normal  appearance. She is obese. She is not ill-appearing or diaphoretic.  HENT:     Head: Normocephalic.     Right Ear: Tympanic membrane, ear canal and external ear normal. There is no impacted cerumen.     Left Ear: Tympanic membrane, ear canal and external ear normal. There is no impacted cerumen.     Nose: Nose normal. No congestion or rhinorrhea.     Mouth/Throat:     Mouth: Mucous membranes are moist.     Pharynx: Oropharynx is clear. No oropharyngeal exudate or posterior oropharyngeal erythema.  Eyes:     General: No scleral icterus.       Right eye: No discharge.        Left eye: No discharge.     Extraocular Movements: Extraocular movements intact.     Conjunctiva/sclera: Conjunctivae normal.     Pupils: Pupils are equal, round, and reactive to light.  Neck:     Vascular: No carotid bruit.  Cardiovascular:     Rate and Rhythm: Normal rate and regular rhythm.     Pulses: Normal pulses.     Heart sounds: Normal heart sounds. No murmur heard.    No friction rub. No gallop.  Pulmonary:     Effort: Pulmonary effort is normal. No respiratory distress.     Breath sounds: Normal breath sounds. No wheezing, rhonchi or rales.  Chest:     Chest wall: No tenderness.  Abdominal:     General: Bowel sounds are normal. There is no distension.     Palpations: Abdomen is soft. There is no mass.     Tenderness: There is no abdominal tenderness. There is no right CVA tenderness, left CVA tenderness, guarding or rebound.  Musculoskeletal:        General: No swelling.     Cervical back: Normal range of motion. No rigidity.     Thoracic back: Scoliosis present.     Right knee: No swelling, effusion, erythema or ecchymosis. Decreased range of motion. Tenderness present.     Left knee: Normal.     Right lower leg: No edema.     Left lower leg: No edema.  Lymphadenopathy:     Cervical: No cervical adenopathy.  Skin:    General: Skin is warm and dry.     Coloration: Skin is not pale.      Findings: No bruising, erythema, lesion or rash.  Neurological:     Mental Status: She is alert and oriented to person, place, and time.  Cranial Nerves: No cranial nerve deficit.     Sensory: No sensory deficit.     Motor: No weakness.     Coordination: Coordination normal.     Gait: Gait normal.  Psychiatric:        Mood and Affect: Mood normal.        Speech: Speech normal.        Behavior: Behavior normal.        Thought Content: Thought content normal.        Cognition and Memory: She exhibits impaired recent memory.        Judgment: Judgment normal.     Comments: Forgetful asking similar question repeatedly after few minutes at times.      Labs reviewed: Recent Labs    10/14/21 1002 04/02/22 0940  NA 137 138  K 5.2 5.0  CL 100 104  CO2 28 23  GLUCOSE 92 91  BUN 25 18  CREATININE 0.86 0.88  CALCIUM 10.5* 9.8   Recent Labs    10/14/21 1002  AST 16  ALT 11  BILITOT 0.6  PROT 7.6   Recent Labs    10/14/21 1002 04/02/22 0940  WBC 8.6 8.3  NEUTROABS 4,799 4,980  HGB 13.2 12.5  HCT 41.4 39.2  MCV 81.5 81.5  PLT 236 241   Lab Results  Component Value Date   TSH 2.71 10/14/2021   Lab Results  Component Value Date   HGBA1C 6.0 (H) 04/02/2022   Lab Results  Component Value Date   CHOL 150 04/02/2022   HDL 56 04/02/2022   LDLCALC 82 04/02/2022   TRIG 50 04/02/2022   CHOLHDL 2.7 04/02/2022    Significant Diagnostic Results in last 30 days:  No results found.  Assessment/Plan 1. Vitamin B12 deficiency Missed 1 dose in previous month patient did not feel like coming out per Husband. Vitamin B12 administered by CMA today. - cyanocobalamin (VITAMIN B12) injection 1,000 mcg - Vitamin B12; Future  2. Essential hypertension Blood pressure stable Continue on statin-hydrochlorothiazide and amlodipine - TSH; Future - COMPLETE METABOLIC PANEL WITH GFR; Future - CBC with Differential/Platelet; Future  3. Mixed hyperlipidemia LDL not at  goal Continue on rosuvastatin Dietary modification advised and exercise as tolerated - Lipid panel; Future  4. Prediabetes Lab Results  Component Value Date   HGBA1C 6.0 (H) 04/02/2022   - Hemoglobin A1c; Future -Dietary modification and exercise advised 5. Back pain, unspecified back location, unspecified back pain laterality, unspecified chronicity Continue current pain regimen -Recommend follow-up with orthopedic pain.  6. Generalized anxiety disorder Stable -Continue on sertraline  7. Major depression, recurrent, chronic (HCC) Mood stable .  Continue on sertraline and Fluvoxamine   8. Psychosis, unspecified psychosis type ([place]) Psychosis symptoms resolved.  Husband requests medication to be discontinued since it makes patient sleepy whenever she takes Abilify.  Dose reduced from 15 to 10 mg by Genia Plants NP on 06/17/2022  - advised to Reduce Abilify from 10 mg tablet to 5 mg tablet daily for 2 weeks then discontinue.  -Notify provider if symptoms recurs. - ARIPiprazole (ABILIFY) 5 MG tablet; Take 1 tablet (5 mg total) by mouth daily for 14 days. Then stop  Dispense: 14 tablet; Refill: 0  9. Vitamin D deficiency Continue on vitamin D supplements - Vitamin D, 1,25-dihydroxy; Future  10. Need for vaccination Afebrile Flut shot administered by CMA no acute reaction reported.  - advised to take tylenol for any soreness   11. Overgrown toenails Overgrown thick toenails.will refer  to podiatrist to trim toenails.  - Ambulatory referral to Podiatry  12. Need for pneumococcal vaccination Afebrile  Prevnar 20 vaccine administered by Eleanora Neighbor no allergic reaction noted.  - Pneumococcal conjugate vaccine 20-valent (Prevnar 20)   Family/ staff Communication: Reviewed plan of care with patient and husband verbalized understanding  Labs/tests ordered:  - CBC with Differential/Platelet - CMP with eGFR([facility]) - TSH - Hgb A1C - Lipid panel - Vitamin B  12  Next Appointment : Return in about 6 months (around 01/12/2023) for medical mangement of chronic issues., Fasting labs in 6 months prior to visit.   Sandrea Hughs, NP

## 2022-07-18 ENCOUNTER — Other Ambulatory Visit: Payer: Self-pay | Admitting: Family

## 2022-08-16 ENCOUNTER — Ambulatory Visit: Payer: Medicare Other

## 2022-08-25 ENCOUNTER — Ambulatory Visit (INDEPENDENT_AMBULATORY_CARE_PROVIDER_SITE_OTHER): Payer: Medicare Other

## 2022-08-25 DIAGNOSIS — E538 Deficiency of other specified B group vitamins: Secondary | ICD-10-CM

## 2022-08-25 MED ORDER — CYANOCOBALAMIN 1000 MCG/ML IJ SOLN
1000.0000 ug | Freq: Once | INTRAMUSCULAR | Status: AC
  Administered 2022-08-25: 1000 ug via INTRAMUSCULAR

## 2022-09-03 ENCOUNTER — Other Ambulatory Visit: Payer: Self-pay | Admitting: Family

## 2022-09-03 DIAGNOSIS — F29 Unspecified psychosis not due to a substance or known physiological condition: Secondary | ICD-10-CM

## 2022-09-09 DIAGNOSIS — H401131 Primary open-angle glaucoma, bilateral, mild stage: Secondary | ICD-10-CM | POA: Diagnosis not present

## 2022-09-12 ENCOUNTER — Other Ambulatory Visit: Payer: Self-pay | Admitting: Family

## 2022-09-13 ENCOUNTER — Other Ambulatory Visit: Payer: Self-pay | Admitting: Family

## 2022-09-29 ENCOUNTER — Ambulatory Visit (INDEPENDENT_AMBULATORY_CARE_PROVIDER_SITE_OTHER): Payer: Medicare Other | Admitting: Family

## 2022-09-29 DIAGNOSIS — E538 Deficiency of other specified B group vitamins: Secondary | ICD-10-CM | POA: Diagnosis not present

## 2022-09-29 MED ORDER — CYANOCOBALAMIN 1000 MCG/ML IJ SOLN
1000.0000 ug | Freq: Once | INTRAMUSCULAR | Status: AC
  Administered 2022-09-29: 1000 ug via INTRAMUSCULAR

## 2022-09-30 ENCOUNTER — Other Ambulatory Visit: Payer: Self-pay | Admitting: Family

## 2022-09-30 DIAGNOSIS — F29 Unspecified psychosis not due to a substance or known physiological condition: Secondary | ICD-10-CM

## 2022-11-04 ENCOUNTER — Ambulatory Visit (INDEPENDENT_AMBULATORY_CARE_PROVIDER_SITE_OTHER): Payer: Medicare Other | Admitting: *Deleted

## 2022-11-04 DIAGNOSIS — E538 Deficiency of other specified B group vitamins: Secondary | ICD-10-CM | POA: Diagnosis not present

## 2022-11-04 MED ORDER — CYANOCOBALAMIN 1000 MCG/ML IJ SOLN
1000.0000 ug | Freq: Once | INTRAMUSCULAR | Status: AC
  Administered 2022-11-04: 1000 ug via INTRAMUSCULAR

## 2022-11-08 ENCOUNTER — Ambulatory Visit (INDEPENDENT_AMBULATORY_CARE_PROVIDER_SITE_OTHER): Payer: Medicare Other | Admitting: Adult Health

## 2022-11-08 ENCOUNTER — Encounter: Payer: Self-pay | Admitting: Adult Health

## 2022-11-08 VITALS — BP 136/84 | HR 101 | Temp 97.1°F | Resp 18 | Ht 60.0 in | Wt 162.0 lb

## 2022-11-08 DIAGNOSIS — I10 Essential (primary) hypertension: Secondary | ICD-10-CM

## 2022-11-08 DIAGNOSIS — F339 Major depressive disorder, recurrent, unspecified: Secondary | ICD-10-CM | POA: Diagnosis not present

## 2022-11-08 DIAGNOSIS — J0120 Acute ethmoidal sinusitis, unspecified: Secondary | ICD-10-CM | POA: Diagnosis not present

## 2022-11-08 LAB — POC COVID19 BINAXNOW: SARS Coronavirus 2 Ag: NEGATIVE

## 2022-11-08 MED ORDER — AZITHROMYCIN 250 MG PO TABS: ORAL_TABLET | ORAL | 0 refills | Status: AC

## 2022-11-08 NOTE — Patient Instructions (Signed)

## 2022-11-08 NOTE — Progress Notes (Signed)
[facility] clinic  Provider:  Durenda Age DNP  Code Status: Full Code  Goals of Care:     04/12/2022    2:14 PM  Advanced Directives  Does Patient Have a Medical Advance Directive? No  Would patient like information on creating a medical advance directive? No - Patient declined     Chief Complaint  Patient presents with   Acute Visit    Patient is being seen for chronic cough    HPI: Patient is a 77 y.o. female seen today for an acute visit for coughing. She was accompanied today by her husband. She complains of cold and cough X 1 week. She stated that when she coughs, she has pain in the middle of her eyes. She has yellowish productive cough, denies fever and chills. She has a nasal voice.  She tested negative for COVID-19.  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[place])    Pure hypercholesterolemia    Scoliosis    Vitamin D deficiency     Past Surgical History:  Procedure Laterality Date   COLONOSCOPY N/A 09/29/2016   Procedure: COLONOSCOPY;  Surgeon: Irene Shipper, MD;  Location: [facility];  Service: Endoscopy;  Laterality: N/A;   ESOPHAGOGASTRODUODENOSCOPY N/A 09/29/2016   Procedure: ESOPHAGOGASTRODUODENOSCOPY (EGD);  Surgeon: Irene Shipper, MD;  [facility];  Service: Endoscopy;  Laterality: N/A;   NO PAST SURGERIES      No Known Allergies  Outpatient Encounter Medications as of 11/08/2022  Medication Sig   acetaminophen (TYLENOL) 500 MG tablet Take 1,000 mg by mouth every 6 (six) hours as needed (pain).   amLODipine (NORVASC) 10 MG tablet TAKE 1 TABLET (10 MG TOTAL) BY MOUTH EVERY MORNING.   Cholecalciferol (VITAMIN D-3)  125 MCG (5000 UT) TABS Take 5,000 Units by mouth daily.   cyanocobalamin (,VITAMIN B-12,) 1000 MCG/ML injection Inject 1,000 mcg into the muscle every 30 (thirty) days.   docusate sodium (COLACE) 100 MG capsule Take 100 mg by mouth daily as needed for mild constipation.   fluvoxaMINE (LUVOX) 100 MG tablet TAKE 1 TABLET BY MOUTH EVERYDAY AT BEDTIME   losartan-hydrochlorothiazide (HYZAAR) 50-12.5 MG tablet TAKE 1 TABLET BY MOUTH EVERYDAY AT BEDTIME   melatonin 3 MG TABS tablet Take 1 tablet (3 mg total) by mouth at bedtime.   memantine (NAMENDA) 5 MG tablet TAKE 1 TABLET (5 MG TOTAL) BY MOUTH DAILY.   Menthol, Topical Analgesic, (BIOFREEZE EX) Apply 1 application topically daily as needed (arthritis/knee pain).   ROCKLATAN 0.02-0.005 % SOLN Place 1 drop into both eyes at bedtime.   rosuvastatin (CRESTOR) 10 MG tablet TAKE 1 TABLET BY MOUTH EVERY DAY   sertraline (ZOLOFT) 25 MG tablet TAKE 1 TABLET (25 MG TOTAL) BY MOUTH DAILY.   UNABLE TO FIND Med Name: Cholecalciferol 150 mcg daily for repletion   ARIPiprazole (ABILIFY) 5 MG tablet Take 1 tablet (5 mg total) by mouth daily for 14 days. Then stop   [DISCONTINUED] ARIPiprazole (ABILIFY) 10 MG tablet TAKE 1 TABLET BY MOUTH EVERY DAY   No facility-administered encounter medications on file as  of 11/08/2022.    Review of Systems:  Review of Systems  Constitutional:  Negative for appetite change, chills, fatigue and fever.  HENT:  Positive for sinus pressure, sinus pain and voice change. Negative for congestion, hearing loss, rhinorrhea and sore throat.   Eyes: Negative.   Respiratory:  Positive for cough. Negative for shortness of breath and wheezing.   Cardiovascular:  Negative for chest pain, palpitations and leg swelling.  Gastrointestinal:  Negative for abdominal pain, constipation, diarrhea, nausea and vomiting.  Genitourinary:  Negative for dysuria.  Musculoskeletal:  Negative for arthralgias, back pain and myalgias.  Skin:  Negative for  color change, rash and wound.  Neurological:  Negative for dizziness, weakness and headaches.  Psychiatric/Behavioral:  Negative for behavioral problems. The patient is not nervous/anxious.     Health Maintenance  Topic Date Due   Medicare Annual Wellness (AWV)  Never done   DTaP/Tdap/Td (1 - Tdap) Never done   Zoster Vaccines- Shingrix (1 of 2) Never done   COVID-19 Vaccine (4 - 2023-24 season) 05/07/2022   Pneumonia Vaccine 68+ Years old  Completed   DEXA SCAN  Completed   Hepatitis C Screening  Completed   HPV VACCINES  Aged Out   INFLUENZA VACCINE  Discontinued   COLONOSCOPY (Pts 45-16yr Insurance coverage will need to be confirmed)  Discontinued    Physical Exam: Vitals:   11/08/22 1446  BP: 136/84  Pulse: (!) 101  Resp: 18  Temp: (!) 97.1 F (36.2 C)  SpO2: 96%  Weight: 162 lb (73.5 kg)  Height: 5' (1.524 m)   Body mass index is 31.64 kg/m. Physical Exam Constitutional:      General: She is not in acute distress.    Appearance: She is obese.  HENT:     Head: Normocephalic and atraumatic.     Nose: Nose normal.     Mouth/Throat:     Mouth: Mucous membranes are moist.  Eyes:     Conjunctiva/sclera: Conjunctivae normal.  Cardiovascular:     Rate and Rhythm: Normal rate and regular rhythm.  Pulmonary:     Effort: Pulmonary effort is normal.     Breath sounds: Normal breath sounds.  Abdominal:     General: Bowel sounds are normal.     Palpations: Abdomen is soft.  Musculoskeletal:        General: Normal range of motion.     Cervical back: Normal range of motion.  Skin:    General: Skin is warm and dry.  Neurological:     General: No focal deficit present.     Mental Status: She is alert and oriented to person, place, and time.  Psychiatric:        Mood and Affect: Mood normal.        Behavior: Behavior normal.        Thought Content: Thought content normal.        Judgment: Judgment normal.     Labs reviewed: Basic Metabolic Panel: Recent Labs     04/02/22 0940  NA 138  K 5.0  CL 104  CO2 23  GLUCOSE 91  BUN 18  CREATININE 0.88  CALCIUM 9.8   Liver Function Tests: No results for input(s): "AST", "ALT", "ALKPHOS", "BILITOT", "PROT", "ALBUMIN" in the last 8760 hours. No results for input(s): "LIPASE", "AMYLASE" in the last 8760 hours. No results for input(s): "AMMONIA" in the last 8760 hours. CBC: Recent Labs    04/02/22 0940  WBC 8.3  NEUTROABS 4,980  HGB 12.5  HCT 39.2  MCV 81.5  PLT 241   Lipid Panel: Recent Labs    04/02/22 0940  CHOL 150  HDL 56  LDLCALC 82  TRIG 50  CHOLHDL 2.7   Lab Results  Component Value Date   HGBA1C 6.0 (H) 04/02/2022    Procedures since last visit: No results found.  Assessment/Plan  1. Acute ethmoidal sinusitis, recurrence not specified -  drink plenty of fluids -   steam inhalations BID - azithromycin (ZITHROMAX) 250 MG tablet; Take 2 tablets on day 1, then 1 tablet daily on days 2 through 5  Dispense: 6 tablet; Refill: 0 - POC COVID-19  2. Primary hypertension -  BP 136/84 -  continue Amlodipine and Losartan-HCTZ  3. Major depression, recurrent, chronic ([place]) -  denies feeling depressed -  continue Sertraline    Labs/tests ordered:  POC COVID-19  Next appt:  12/03/2022

## 2022-12-03 ENCOUNTER — Ambulatory Visit: Payer: Medicare Other

## 2022-12-08 ENCOUNTER — Ambulatory Visit (INDEPENDENT_AMBULATORY_CARE_PROVIDER_SITE_OTHER): Payer: Medicare Other

## 2022-12-08 DIAGNOSIS — E538 Deficiency of other specified B group vitamins: Secondary | ICD-10-CM | POA: Diagnosis not present

## 2022-12-08 MED ORDER — CYANOCOBALAMIN 1000 MCG/ML IJ SOLN
1000.0000 ug | Freq: Once | INTRAMUSCULAR | Status: AC
Start: 2022-12-08 — End: 2022-12-08
  Administered 2022-12-08: 1000 ug via INTRAMUSCULAR

## 2022-12-10 DIAGNOSIS — H401131 Primary open-angle glaucoma, bilateral, mild stage: Secondary | ICD-10-CM | POA: Diagnosis not present

## 2022-12-10 DIAGNOSIS — H524 Presbyopia: Secondary | ICD-10-CM | POA: Diagnosis not present

## 2023-01-02 ENCOUNTER — Other Ambulatory Visit: Payer: Self-pay | Admitting: Family

## 2023-01-02 DIAGNOSIS — F29 Unspecified psychosis not due to a substance or known physiological condition: Secondary | ICD-10-CM

## 2023-01-03 NOTE — Telephone Encounter (Signed)
Discontinued by provider. Renewing this prescription may not be appropriate.

## 2023-01-11 ENCOUNTER — Ambulatory Visit: Payer: Medicare Other | Admitting: Family

## 2023-01-12 ENCOUNTER — Ambulatory Visit (INDEPENDENT_AMBULATORY_CARE_PROVIDER_SITE_OTHER): Payer: Medicare Other | Admitting: Family

## 2023-01-12 ENCOUNTER — Encounter: Payer: Self-pay | Admitting: Family

## 2023-01-12 VITALS — BP 138/78 | HR 110 | Temp 96.8°F | Resp 17 | Ht 63.5 in | Wt 168.6 lb

## 2023-01-12 DIAGNOSIS — D500 Iron deficiency anemia secondary to blood loss (chronic): Secondary | ICD-10-CM | POA: Diagnosis not present

## 2023-01-12 DIAGNOSIS — I714 Abdominal aortic aneurysm, without rupture, unspecified: Secondary | ICD-10-CM | POA: Diagnosis not present

## 2023-01-12 DIAGNOSIS — E782 Mixed hyperlipidemia: Secondary | ICD-10-CM

## 2023-01-12 DIAGNOSIS — F29 Unspecified psychosis not due to a substance or known physiological condition: Secondary | ICD-10-CM | POA: Diagnosis not present

## 2023-01-12 DIAGNOSIS — E538 Deficiency of other specified B group vitamins: Secondary | ICD-10-CM | POA: Diagnosis not present

## 2023-01-12 DIAGNOSIS — R7303 Prediabetes: Secondary | ICD-10-CM

## 2023-01-12 DIAGNOSIS — E559 Vitamin D deficiency, unspecified: Secondary | ICD-10-CM | POA: Diagnosis not present

## 2023-01-12 DIAGNOSIS — I10 Essential (primary) hypertension: Secondary | ICD-10-CM | POA: Diagnosis not present

## 2023-01-12 MED ORDER — CYANOCOBALAMIN 1000 MCG/ML IJ SOLN
1000.0000 ug | Freq: Once | INTRAMUSCULAR | Status: AC
Start: 2023-01-12 — End: 2023-01-12
  Administered 2023-01-12: 1000 ug via INTRAMUSCULAR

## 2023-01-12 NOTE — Progress Notes (Signed)
Provider: Richarda Blade FNP-C   Antonette Hendricks, Donalee Citrin, NP  Patient Care Team: Connie Hilgert, Donalee Citrin, NP as PCP - General (Family Medicine)  Extended Emergency Contact Information Primary Emergency Contact: Renaldo Fidd[place] Mobile Phone: [phone] Relation: Daughter Secondary Emergency Contact: Flaugher,Edward  [place] Home Phone: [phone] Relation: Spouse  Code Status:  Full Code  Goals of care: Advanced Directive information    01/12/2023    2:57 PM  Advanced Directives  Does Patient Have a Medical Advance Directive? Yes  Type of Advance Directive Healthcare Power of Attorney  Does patient want to make changes to medical advance directive? No - Patient declined  Copy of Healthcare Power of Attorney in Chart? No - copy requested     Chief Complaint  Patient presents with   Medical Management of Chronic Issues    6 month follow up patient has no other concerns     HPI:  Pt is a 77 y.o. female seen today for 6 months follow up for medical management of chronic diseases. She is here with Husband.  Has had 6 lbs weight gain over 6 months. Eats at least 2 meals .tends to snack on sweet.  States feels down every now and then but gets up any where.Has not lost interest. Likes to read and does puzzles.  States no fall episode.   Also here for Vitamin B 12 injection. Denies any increased fatigue.  Due for Tdap ,shingles and COVID-19 vaccine reminded to get vaccine at the pharmacy.   Past Medical History:  Diagnosis Date   Abdominal aortic aneurysm without rupture (HCC)    noted on imaging 2018   Anemia    Arthritis    Atherosclerosis of aorta (HCC)    Colon polyps    GERD (gastroesophageal reflux disease)    Hemorrhoids    Hiatal hernia    HLD (hyperlipidemia)    Hypertension    Osteopenia    Osteopenia    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HCC)    Pure hypercholesterolemia    Scoliosis    Vitamin D deficiency    Past  Surgical History:  Procedure Laterality Date   COLONOSCOPY N/A 09/29/2016   Procedure: COLONOSCOPY;  Surgeon: Hilarie Fredrickson, MD;  Location: [facility];  Service: Endoscopy;  Laterality: N/A;   ESOPHAGOGASTRODUODENOSCOPY N/A 09/29/2016   Procedure: ESOPHAGOGASTRODUODENOSCOPY (EGD);  Surgeon: Hilarie Fredrickson, MD;  Location: [facility];  Service: Endoscopy;  Laterality: N/A;   NO PAST SURGERIES      No Known Allergies  Allergies as of 01/12/2023   No Known Allergies      Medication List        Accurate as of Jan 12, 2023  3:30 PM. If you have any questions, ask your nurse or doctor.          acetaminophen 500 MG tablet Commonly known as: TYLENOL Take 1,000 mg by mouth every 6 (six) hours as needed (pain).   amLODipine 10 MG tablet Commonly known as: NORVASC TAKE 1 TABLET (10 MG TOTAL) BY MOUTH EVERY MORNING.   BIOFREEZE EX Apply 1 application topically daily as needed (arthritis/knee pain).   cyanocobalamin 1000 MCG/ML injection Commonly known as: VITAMIN B12 Inject 1,000 mcg into the muscle every 30 (thirty) days.   docusate sodium 100 MG capsule Commonly known as: COLACE Take 100 mg by mouth daily as needed for mild constipation.   losartan-hydrochlorothiazide 50-12.5 MG tablet Commonly known as: HYZAAR TAKE 1 TABLET BY MOUTH EVERYDAY AT  BEDTIME   melatonin 3 MG Tabs tablet Take 1 tablet (3 mg total) by mouth at bedtime.   memantine 5 MG tablet Commonly known as: NAMENDA TAKE 1 TABLET (5 MG TOTAL) BY MOUTH DAILY.   Rocklatan 0.02-0.005 % Soln Generic drug: Netarsudil-Latanoprost Place 1 drop into both eyes at bedtime.   rosuvastatin 10 MG tablet Commonly known as: CRESTOR TAKE 1 TABLET BY MOUTH EVERY DAY   sertraline 25 MG tablet Commonly known as: ZOLOFT TAKE 1 TABLET (25 MG TOTAL) BY MOUTH DAILY.   UNABLE TO FIND Med Name: Cholecalciferol 150 mcg daily for repletion   Vitamin D-3 125 MCG (5000 UT) Tabs Take 5,000 Units by mouth daily.         Review of Systems  Constitutional:  Negative for appetite change, chills, fatigue, fever and unexpected weight change.  HENT:  Negative for congestion, dental problem, ear discharge, ear pain, facial swelling, hearing loss, nosebleeds, postnasal drip, rhinorrhea, sinus pressure, sinus pain, sneezing, sore throat, tinnitus and trouble swallowing.   Eyes:  Negative for pain, discharge, redness, itching and visual disturbance.  Respiratory:  Negative for cough, chest tightness, shortness of breath and wheezing.   Cardiovascular:  Negative for chest pain, palpitations and leg swelling.  Gastrointestinal:  Negative for abdominal distention, abdominal pain, blood in stool, constipation, diarrhea, nausea and vomiting.  Endocrine: Negative for cold intolerance, heat intolerance, polydipsia, polyphagia and polyuria.  Genitourinary:  Negative for difficulty urinating, dysuria, flank pain, frequency and urgency.  Musculoskeletal:  Positive for arthralgias, back pain and gait problem. Negative for joint swelling, myalgias, neck pain and neck stiffness.  Skin:  Negative for color change, pallor, rash and wound.  Neurological:  Negative for dizziness, syncope, speech difficulty, weakness, light-headedness, numbness and headaches.  Hematological:  Does not bruise/bleed easily.  Psychiatric/Behavioral:  Negative for agitation, behavioral problems, confusion, hallucinations, self-injury, sleep disturbance and suicidal ideas. The patient is not nervous/anxious.     Immunization History  Administered Date(s) Administered   Influenza, Quadrivalent, Recombinant, Inj, Pf 10/09/2018   Influenza,inj,quad, With Preservative 10/02/2015   PFIZER(Purple Top)SARS-COV-2 Vaccination 11/26/2019, 12/17/2019, 09/03/2020   PNEUMOCOCCAL CONJUGATE-20 07/14/2022   Pneumococcal Conjugate-13 10/02/2015   Pertinent  Health Maintenance Due  Topic Date Due   DEXA SCAN  Completed   INFLUENZA VACCINE  Discontinued    COLONOSCOPY (Pts 45-40yrs Insurance coverage will need to be confirmed)  Discontinued      04/07/2021   12:38 PM 06/17/2021    3:26 PM 10/06/2021    4:31 PM 04/12/2022    2:14 PM 01/12/2023    2:57 PM  Fall Risk  Falls in the past year? 0 0 0 0 0  Was there an injury with Fall? 0 0 0 0 0  Fall Risk Category Calculator 0 0 0 0 0  Fall Risk Category (Retired) Low Low Low Low   (RETIRED) Patient Fall Risk Level Low fall risk Low fall risk Low fall risk Low fall risk   Patient at Risk for Falls Due to No Fall Risks No Fall Risks No Fall Risks No Fall Risks No Fall Risks  Fall risk Follow up Falls evaluation completed Falls evaluation completed Falls evaluation completed Falls evaluation completed Falls evaluation completed   Functional Status Survey:    Vitals:   01/12/23 1458  BP: 138/78  Pulse: (!) 110  Resp: 17  Temp: (!) 96.8 F (36 C)  TempSrc: Temporal  SpO2: 96%  Weight: 168 lb 9.6 oz (76.5 kg)  Height: 5' 3.5" (1.613 m)  Body mass index is 29.4 kg/m. Physical Exam Vitals reviewed.  Constitutional:      General: She is not in acute distress.    Appearance: Normal appearance. She is overweight. She is not ill-appearing or diaphoretic.  HENT:     Head: Normocephalic.     Right Ear: Tympanic membrane, ear canal and external ear normal. There is no impacted cerumen.     Left Ear: Tympanic membrane, ear canal and external ear normal. There is no impacted cerumen.     Nose: Nose normal. No congestion or rhinorrhea.     Mouth/Throat:     Mouth: Mucous membranes are moist.     Pharynx: Oropharynx is clear. No oropharyngeal exudate or posterior oropharyngeal erythema.  Eyes:     General: No scleral icterus.       Right eye: No discharge.        Left eye: No discharge.     Extraocular Movements: Extraocular movements intact.     Conjunctiva/sclera: Conjunctivae normal.     Pupils: Pupils are equal, round, and reactive to light.  Neck:     Vascular: No carotid bruit.   Cardiovascular:     Rate and Rhythm: Normal rate and regular rhythm.     Pulses: Normal pulses.     Heart sounds: Normal heart sounds. No murmur heard.    No friction rub. No gallop.  Pulmonary:     Effort: Pulmonary effort is normal. No respiratory distress.     Breath sounds: Normal breath sounds. No wheezing, rhonchi or rales.  Chest:     Chest wall: No tenderness.  Abdominal:     General: Bowel sounds are normal. There is no distension.     Palpations: Abdomen is soft. There is no mass.     Tenderness: There is no abdominal tenderness. There is no right CVA tenderness, left CVA tenderness, guarding or rebound.     Comments: Abdominal Brace in place   Musculoskeletal:        General: No swelling or tenderness. Normal range of motion.     Cervical back: Normal range of motion. No rigidity or tenderness.     Right lower leg: No edema.     Left lower leg: No edema.  Feet:     Right foot:     Skin integrity: No skin breakdown, erythema, warmth, callus or dry skin.     Toenail Condition: Right toenails are abnormally thick and long. Fungal disease present.    Left foot:     Skin integrity: No skin breakdown, erythema, warmth, callus or dry skin.     Toenail Condition: Left toenails are abnormally thick and long. Fungal disease present. Lymphadenopathy:     Cervical: No cervical adenopathy.  Skin:    General: Skin is warm and dry.     Coloration: Skin is not pale.     Findings: No bruising, erythema, lesion or rash.  Neurological:     Mental Status: She is alert and oriented to person, place, and time.     Cranial Nerves: No cranial nerve deficit.     Sensory: No sensory deficit.     Motor: No weakness.     Coordination: Coordination normal.     Gait: Gait abnormal.  Psychiatric:        Mood and Affect: Mood normal.        Speech: Speech normal.        Behavior: Behavior normal.        Thought Content: Thought  content normal.        Judgment: Judgment normal.     Labs  reviewed: Recent Labs    04/02/22 0940  NA 138  K 5.0  CL 104  CO2 23  GLUCOSE 91  BUN 18  CREATININE 0.88  CALCIUM 9.8   No results for input(s): "AST", "ALT", "ALKPHOS", "BILITOT", "PROT", "ALBUMIN" in the last 8760 hours. Recent Labs    04/02/22 0940  WBC 8.3  NEUTROABS 4,980  HGB 12.5  HCT 39.2  MCV 81.5  PLT 241   Lab Results  Component Value Date   TSH 2.71 10/14/2021   Lab Results  Component Value Date   HGBA1C 6.0 (H) 04/02/2022   Lab Results  Component Value Date   CHOL 150 04/02/2022   HDL 56 04/02/2022   LDLCALC 82 04/02/2022   TRIG 50 04/02/2022   CHOLHDL 2.7 04/02/2022    Significant Diagnostic Results in last 30 days:  No results found.  Assessment/Plan 1. Vitamin D deficiency Continue on vitamin D supplements - Vitamin D, 1,25-dihydroxy  2. Vitamin B12 deficiency Received vitamin B-12 injection this visit.  Will recheck levels. - Vitamin B12 - cyanocobalamin (VITAMIN B12) injection 1,000 mcg  3. Prediabetes Lab Results  Component Value Date   HGBA1C 6.0 (H) 04/02/2022  -Dietary modification and exercise advised - Hemoglobin A1c  4. Essential hypertension Blood pressure stable -Dietary modification and exercise advised -Continue on losartan-hydrochlorothiazide, amlodipine - CBC with Differential/Platelet - COMPLETE METABOLIC PANEL WITH GFR - TSH  5. Mixed hyperlipidemia LDL at goal -dietary modification and exercise advised -Continue rosuvastatin - Lipid panel  6. Psychosis, unspecified psychosis type (HCC) Symptoms controlled -Continue on memantine and sertraline  7. Abdominal aortic aneurysm (AAA) without rupture, unspecified part (HCC) Measured 2.7 cm [11/19/2019] increased from 2.5 cm [09/28/2016].  Follow-up abdominal ultrasound recommended in 5 years. Per vascular specialist notes appears to be fusiform and tortuous.Continues to wear his abdominal brace for support.Aware of rupture signs. -Advised to continue to avoid  lifting heavy loads -Continue to follow-up with vascular specialist  Family/ staff Communication: Reviewed plan of care with patient and Husband verbalized   Labs/tests ordered:  - CBC with Differential/Platelet - COMPLETE METABOLIC PANEL WITH GFR - TSH - Lipid panel - Vitamin D, 1,25-dihydroxy  Next Appointment : Return in about 6 months (around 07/15/2023) for medical mangement of chronic issues., Annual wellness visit soon.Caesar Bookman, NP

## 2023-01-16 DIAGNOSIS — F29 Unspecified psychosis not due to a substance or known physiological condition: Secondary | ICD-10-CM | POA: Insufficient documentation

## 2023-01-16 DIAGNOSIS — I714 Abdominal aortic aneurysm, without rupture, unspecified: Secondary | ICD-10-CM | POA: Insufficient documentation

## 2023-01-16 LAB — CBC WITH DIFFERENTIAL/PLATELET
Absolute Monocytes: 634 cells/uL (ref 200–950)
Basophils Absolute: 48 cells/uL (ref 0–200)
Basophils Relative: 0.5 %
Eosinophils Absolute: 125 cells/uL (ref 15–500)
Eosinophils Relative: 1.3 %
HCT: 40.5 % (ref 35.0–45.0)
Hemoglobin: 12.6 g/dL (ref 11.7–15.5)
Lymphs Abs: 3466 cells/uL (ref 850–3900)
MCH: 25 pg — ABNORMAL LOW (ref 27.0–33.0)
MCHC: 31.1 g/dL — ABNORMAL LOW (ref 32.0–36.0)
MCV: 80.2 fL (ref 80.0–100.0)
MPV: 11.8 fL (ref 7.5–12.5)
Monocytes Relative: 6.6 %
Neutro Abs: 5328 cells/uL (ref 1500–7800)
Neutrophils Relative %: 55.5 %
Platelets: 229 10*3/uL (ref 140–400)
RBC: 5.05 10*6/uL (ref 3.80–5.10)
RDW: 15.4 % — ABNORMAL HIGH (ref 11.0–15.0)
Total Lymphocyte: 36.1 %
WBC: 9.6 10*3/uL (ref 3.8–10.8)

## 2023-01-16 LAB — COMPLETE METABOLIC PANEL WITH GFR
AG Ratio: 1.2 (calc) (ref 1.0–2.5)
ALT: 10 U/L (ref 6–29)
AST: 14 U/L (ref 10–35)
Albumin: 4.2 g/dL (ref 3.6–5.1)
Alkaline phosphatase (APISO): 75 U/L (ref 37–153)
BUN/Creatinine Ratio: 23 (calc) — ABNORMAL HIGH (ref 6–22)
BUN: 27 mg/dL — ABNORMAL HIGH (ref 7–25)
CO2: 27 mmol/L (ref 20–32)
Calcium: 10.2 mg/dL (ref 8.6–10.4)
Chloride: 101 mmol/L (ref 98–110)
Creat: 1.17 mg/dL — ABNORMAL HIGH (ref 0.60–1.00)
Globulin: 3.4 g/dL (calc) (ref 1.9–3.7)
Glucose, Bld: 93 mg/dL (ref 65–99)
Potassium: 4.7 mmol/L (ref 3.5–5.3)
Sodium: 138 mmol/L (ref 135–146)
Total Bilirubin: 0.6 mg/dL (ref 0.2–1.2)
Total Protein: 7.6 g/dL (ref 6.1–8.1)
eGFR: 48 mL/min/{1.73_m2} — ABNORMAL LOW (ref >=60)

## 2023-01-16 LAB — LIPID PANEL
Cholesterol: 169 mg/dL (ref <200)
HDL: 55 mg/dL (ref >=50)
LDL Cholesterol (Calc): 97 mg/dL (calc)
Non-HDL Cholesterol (Calc): 114 mg/dL (calc) (ref <130)
Total CHOL/HDL Ratio: 3.1 (calc) (ref <5.0)
Triglycerides: 81 mg/dL (ref <150)

## 2023-01-16 LAB — TSH: TSH: 2.17 mIU/L (ref 0.40–4.50)

## 2023-01-16 LAB — VITAMIN D 1,25 DIHYDROXY
Vitamin D 1, 25 (OH)2 Total: 24 pg/mL (ref 18–72)
Vitamin D2 1, 25 (OH)2: <8 pg/mL
Vitamin D3 1, 25 (OH)2: 24 pg/mL

## 2023-01-16 LAB — VITAMIN B12: Vitamin B-12: >2000 pg/mL — ABNORMAL HIGH (ref 200–1100)

## 2023-01-16 LAB — HEMOGLOBIN A1C
Hgb A1c MFr Bld: 6.5 % of total Hgb — ABNORMAL HIGH (ref <5.7)
Mean Plasma Glucose: 140 mg/dL
eAG (mmol/L): 7.7 mmol/L

## 2023-01-26 ENCOUNTER — Other Ambulatory Visit: Payer: Self-pay

## 2023-01-26 DIAGNOSIS — E11319 Type 2 diabetes mellitus with unspecified diabetic retinopathy without macular edema: Secondary | ICD-10-CM

## 2023-01-26 DIAGNOSIS — N183 Chronic kidney disease, stage 3 unspecified: Secondary | ICD-10-CM

## 2023-01-26 DIAGNOSIS — E139 Other specified diabetes mellitus without complications: Secondary | ICD-10-CM

## 2023-02-07 ENCOUNTER — Ambulatory Visit (INDEPENDENT_AMBULATORY_CARE_PROVIDER_SITE_OTHER): Payer: Medicare Other

## 2023-02-07 DIAGNOSIS — E538 Deficiency of other specified B group vitamins: Secondary | ICD-10-CM | POA: Diagnosis not present

## 2023-02-07 MED ORDER — CYANOCOBALAMIN 1000 MCG/ML IJ SOLN
1000.0000 ug | Freq: Once | INTRAMUSCULAR | Status: AC
Start: 2023-02-07 — End: 2023-02-07
  Administered 2023-02-07: 1000 ug via INTRAMUSCULAR

## 2023-03-17 ENCOUNTER — Ambulatory Visit (INDEPENDENT_AMBULATORY_CARE_PROVIDER_SITE_OTHER): Payer: Medicare Other | Admitting: Family

## 2023-03-17 ENCOUNTER — Encounter: Payer: Self-pay | Admitting: Family

## 2023-03-17 VITALS — BP 128/78 | HR 92 | Temp 97.9°F | Ht 63.5 in | Wt 165.0 lb

## 2023-03-17 DIAGNOSIS — Z0000 Encounter for general adult medical examination without abnormal findings: Secondary | ICD-10-CM

## 2023-03-17 NOTE — Progress Notes (Signed)
Subjective:   Meghan Stanley is a 77 y.o. female who presents for Medicare Annual (Subsequent) preventive examination.  Visit Complete: In person  Patient Medicare AWV questionnaire was completed by the patient on 03/17/2023; I have confirmed that all information answered by patient is correct and no changes since this date.  Review of Systems     Cardiac Risk Factors include: advanced age (>73men, >29 women);hypertension;sedentary lifestyle     Objective:    Today's Vitals   03/17/23 1508 03/17/23 1551  BP: 128/78   Pulse: 92   Temp: 97.9 F (36.6 C)   TempSrc: Temporal   SpO2: 98%   Weight: 165 lb (74.8 kg)   Height: 5' 3.5" (1.613 m)   PainSc:  6    Body mass index is 28.77 kg/m.     01/12/2023    2:57 PM 04/12/2022    2:14 PM 10/06/2021    4:31 PM 06/17/2021    3:28 PM 04/06/2021    4:53 PM 03/15/2021    3:33 PM 03/10/2021    1:14 PM  Advanced Directives  Does Patient Have a Medical Advance Directive? Yes No No No No No No  Type of Advance Directive Healthcare Power of Attorney        Does patient want to make changes to medical advance directive? No - Patient declined        Copy of Healthcare Power of Attorney in Chart? No - copy requested        Would patient like information on creating a medical advance directive?  No - Patient declined No - Patient declined No - Patient declined No - Patient declined No - Patient declined No - Patient declined    Current Medications (verified) Outpatient Encounter Medications as of 03/17/2023  Medication Sig   acetaminophen (TYLENOL) 500 MG tablet Take 1,000 mg by mouth every 6 (six) hours as needed (pain).   amLODipine (NORVASC) 10 MG tablet TAKE 1 TABLET (10 MG TOTAL) BY MOUTH EVERY MORNING.   Cholecalciferol (VITAMIN D-3) 125 MCG (5000 UT) TABS Take 5,000 Units by mouth daily.   cyanocobalamin (,VITAMIN B-12,) 1000 MCG/ML injection Inject 1,000 mcg into the muscle every 30 (thirty) days.   docusate sodium (COLACE) 100 MG  capsule Take 100 mg by mouth daily as needed for mild constipation.   losartan-hydrochlorothiazide (HYZAAR) 50-12.5 MG tablet TAKE 1 TABLET BY MOUTH EVERYDAY AT BEDTIME   melatonin 3 MG TABS tablet Take 1 tablet (3 mg total) by mouth at bedtime.   memantine (NAMENDA) 5 MG tablet TAKE 1 TABLET (5 MG TOTAL) BY MOUTH DAILY.   Menthol, Topical Analgesic, (BIOFREEZE EX) Apply 1 application topically daily as needed (arthritis/knee pain).   ROCKLATAN 0.02-0.005 % SOLN Place 1 drop into both eyes at bedtime.   rosuvastatin (CRESTOR) 10 MG tablet TAKE 1 TABLET BY MOUTH EVERY DAY   sertraline (ZOLOFT) 25 MG tablet TAKE 1 TABLET (25 MG TOTAL) BY MOUTH DAILY.   [DISCONTINUED] UNABLE TO FIND Med Name: Cholecalciferol 150 mcg daily for repletion   No facility-administered encounter medications on file as of 03/17/2023.    Allergies (verified) Patient has no known allergies.   History: Past Medical History:  Diagnosis Date   Abdominal aortic aneurysm without rupture (HCC)    noted on imaging 2018   Anemia    Arthritis    Atherosclerosis of aorta (HCC)    Colon polyps    GERD (gastroesophageal reflux disease)    Hemorrhoids    Hiatal hernia  HLD (hyperlipidemia)    Hypertension    Osteopenia    Osteopenia    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HCC)    Pure hypercholesterolemia    Scoliosis    Vitamin D deficiency    Past Surgical History:  Procedure Laterality Date   COLONOSCOPY N/A 09/29/2016   Procedure: COLONOSCOPY;  Surgeon: Hilarie Fredrickson, MD;  Location: [facility];  Service: Endoscopy;  Laterality: N/A;   ESOPHAGOGASTRODUODENOSCOPY N/A 09/29/2016   Procedure: ESOPHAGOGASTRODUODENOSCOPY (EGD);  Surgeon: Hilarie Fredrickson, MD;  Location: [facility];  Service: Endoscopy;  Laterality: N/A;   NO PAST SURGERIES     Family History  Problem Relation Age of Onset   Hypertension Mother    Heart disease Father    Glaucoma Son    Social History   Socioeconomic History   Marital status:  Married    Spouse name: Not on file   Number of children: Not on file   Years of education: Not on file   Highest education level: Not on file  Occupational History   Not on file  Tobacco Use   Smoking status: Never   Smokeless tobacco: Never  Vaping Use   Vaping status: Never Used  Substance and Sexual Activity   Alcohol use: No   Drug use: No   Sexual activity: Not on file  Other Topics Concern   Not on file  Social History Narrative   Tobacco use, amount per day now: None.   Past tobacco use, amount per day: None.   How many years did you use tobacco: None.   Alcohol use (drinks per week): None.   Diet: N/A   Do you drink/eat things with caffeine: Yes   Marital status:  Married                                What year were you married?   Do you live in a house, apartment, assisted living, condo, trailer, etc.? House   Is it one or more stories? One   How many persons live in your home? 3   Do you have pets in your home?( please list) No   Highest Level of education completed? High School   Current or past profession:   Do you exercise?   A little bit                               Type and how often? Walking   Do you have a living will?   Do you have a DNR form?                                   If not, do you want to discuss one?   Do you have signed POA/HPOA forms?                        If so, please bring to you appointment      Do you have any difficulty bathing or dressing yourself? No   Do you have any difficulty preparing food or eating? No   Do you have any difficulty managing your medications? No   Do you have any difficulty managing your finances? No   Do you have any difficulty affording your medications? No   Social Determinants of Health  Financial Resource Strain: Not on file  Food Insecurity: Not on file  Transportation Needs: Not on file  Physical Activity: Not on file  Stress: Not on file  Social Connections: Not on file    Tobacco  Counseling Counseling given: Not Answered   Clinical Intake:     Pain : 0-10 Pain Score: 6  Pain Type: Chronic pain Pain Location: Back (right knee) Pain Orientation: Right Pain Radiating Towards: below knee Pain Descriptors / Indicators: Aching Pain Onset: More than a month ago Pain Frequency: Constant Pain Relieving Factors: rest Effect of Pain on Daily Activities: walking  Pain Relieving Factors: rest  BMI - recorded: 28.77 Nutritional Status: BMI 25 -29 Overweight Nutritional Risks: None Diabetes: No  How often do you need to have someone help you when you read instructions, pamphlets, or other written materials from your doctor or pharmacy?: 1 - Never What is the last grade level you completed in school?: 12 Grade  Interpreter Needed?: No      Activities of Daily Living    03/17/2023    3:56 PM  In your present state of health, do you have any difficulty performing the following activities:  Hearing? 0  Vision? 0  Difficulty concentrating or making decisions? 1  Comment Remembering  Walking or climbing stairs? 0  Dressing or bathing? 0  Doing errands, shopping? 1  Comment Husband assist  Preparing Food and eating ? N  Using the Toilet? N  In the past six months, have you accidently leaked urine? Y  Comment wears pad  Do you have problems with loss of bowel control? N  Managing your Medications? N  Managing your Finances? N  Housekeeping or managing your Housekeeping? N    Patient Care Team: Tyrone Balash, Donalee Citrin, NP as PCP - General (Family Medicine)  Indicate any recent Medical Services you may have received from other than [facility] providers in the past year (date may be approximate).     Assessment:   This is a routine wellness examination for [place].  Hearing/Vision screen Hearing Screening - Comments:: No hearing issues  Vision Screening - Comments:: Last eye exam was April 2024, with Dr.Bowen   Dietary issues and exercise activities discussed:      Goals Addressed             This Visit's Progress    Exercise 150 min/wk Moderate Activity       Weight (lb) < 200 lb (90.7 kg)   165 lb (74.8 kg)    Loss weight        Depression Screen    03/17/2023    3:09 PM 01/12/2023    2:57 PM 03/03/2021   12:49 PM 03/01/2021    9:37 PM  PHQ 2/9 Scores  PHQ - 2 Score 0 0  1  PHQ- 9 Score    4  Exception Documentation      Not completed         Information is confidential and restricted. Go to Review Flowsheets to unlock data.    Fall Risk    03/17/2023    3:09 PM 01/12/2023    2:57 PM 04/12/2022    2:14 PM 10/06/2021    4:31 PM 06/17/2021    3:26 PM  Fall Risk   Falls in the past year? 0 0 0 0 0  Number falls in past yr: 0 0 0 0 0  Injury with Fall? 0 0 0 0 0  Risk for fall due to : No  Fall Risks No Fall Risks No Fall Risks No Fall Risks No Fall Risks  Follow up Falls evaluation completed Falls evaluation completed Falls evaluation completed Falls evaluation completed Falls evaluation completed    MEDICARE RISK AT HOME:  Medicare Risk at Home - 03/17/23 1603     Any stairs in or around the home? Yes    If so, are there any without handrails? No    Home free of loose throw rugs in walkways, pet beds, electrical cords, etc? Yes    Adequate lighting in your home to reduce risk of falls? Yes    Life alert? No    Use of a cane, walker or w/c? Yes    Grab bars in the bathroom? Yes    Shower chair or bench in shower? No    Elevated toilet seat or a handicapped toilet? Yes             TIMED UP AND GO:  Was the test performed?  Yes  Length of time to ambulate 10 feet: 15 sec Gait slow and steady with assistive device    Cognitive Function:    03/17/2023    3:23 PM  MMSE - Mini Mental State Exam  Orientation to time 5  Orientation to Place 5  Registration 3  Attention/ Calculation 5  Recall 2  Language- name 2 objects 2  Language- repeat 1  Language- follow 3 step command 3  Language- read & follow direction 1   Write a sentence 1  Copy design 1  Total score 29        Immunizations Immunization History  Administered Date(s) Administered   Influenza, Quadrivalent, Recombinant, Inj, Pf 10/09/2018   Influenza,inj,quad, With Preservative 10/02/2015   PFIZER(Purple Top)SARS-COV-2 Vaccination 11/26/2019, 12/17/2019, 09/03/2020   PNEUMOCOCCAL CONJUGATE-20 07/14/2022   Pneumococcal Conjugate-13 10/02/2015    TDAP status: Due, Education has been provided regarding the importance of this vaccine. Advised may receive this vaccine at local pharmacy or Health Dept. Aware to provide a copy of the vaccination record if obtained from local pharmacy or Health Dept. Verbalized acceptance and understanding.  Flu Vaccine status: Up to date  Pneumococcal vaccine status: Up to date  Covid-19 vaccine status: Completed vaccines  Qualifies for Shingles Vaccine? Yes   Zostavax completed No   Shingrix Completed?: No.    Education has been provided regarding the importance of this vaccine. Patient has been advised to call insurance company to determine out of pocket expense if they have not yet received this vaccine. Advised may also receive vaccine at local pharmacy or Health Dept. Verbalized acceptance and understanding.  Screening Tests Health Maintenance  Topic Date Due   DTaP/Tdap/Td (1 - Tdap) Never done   Zoster Vaccines- Shingrix (1 of 2) Never done   COVID-19 Vaccine (4 - 2023-24 season) 09/06/2028 (Originally 05/07/2022)   Medicare Annual Wellness (AWV)  03/16/2024   Pneumonia Vaccine 53+ Years old  Completed   DEXA SCAN  Completed   Hepatitis C Screening  Completed   HPV VACCINES  Aged Out   INFLUENZA VACCINE  Discontinued   Colonoscopy  Discontinued    Health Maintenance  Health Maintenance Due  Topic Date Due   DTaP/Tdap/Td (1 - Tdap) Never done   Zoster Vaccines- Shingrix (1 of 2) Never done    Colorectal cancer screening: No longer required.   Mammogram status: No longer required due  to advance age .  Bone Density status: Completed 03/30/2021. Results reflect: Bone density results: OSTEOPENIA. Repeat every  2 years.  Lung Cancer Screening: (Low Dose CT Chest recommended if Age 60-80 years, 20 pack-year currently smoking OR have quit w/in 15years.) does not qualify.   Lung Cancer Screening Referral: No   Additional Screening:  Hepatitis C Screening: does qualify; Completed yes  Vision Screening: Recommended annual ophthalmology exams for early detection of glaucoma and other disorders of the eye. Is the patient up to date with their annual eye exam?  Yes  Who is the provider or what is the name of the office in which the patient attends annual eye exams? Dr.Bowen  If pt is not established with a provider, would they like to be referred to a provider to establish care? No .   Dental Screening: Recommended annual dental exams for proper oral hygiene  Diabetic Foot Exam: Diabetic Foot Exam: Completed N/A   Community Resource Referral / Chronic Care Management: CRR required this visit?  No   CCM required this visit?  No     Plan:     I have personally reviewed and noted the following in the patient's chart:   Medical and social history Use of alcohol, tobacco or illicit drugs  Current medications and supplements including opioid prescriptions. Patient is not currently taking opioid prescriptions. Functional ability and status Nutritional status Physical activity Advanced directives List of other physicians Hospitalizations, surgeries, and ER visits in previous 12 months Vitals Screenings to include cognitive, depression, and falls Referrals and appointments  In addition, I have reviewed and discussed with patient certain preventive protocols, quality metrics, and best practice recommendations. A written personalized care plan for preventive services as well as general preventive health recommendations were provided to patient.     Caesar Bookman,  NP   03/17/2023   After Visit Summary: (Pick Up) Due to this being a telephonic visit, with patients personalized plan was offered to patient and patient has requested to Pick up at office.In office   Nurse Notes: Advised to get Tdap and shingles vaccine at the pharmacy.

## 2023-03-17 NOTE — Patient Instructions (Signed)
Meghan Stanley , Thank you for taking time to come for your Medicare Wellness Visit. I appreciate your ongoing commitment to your health goals. Please review the following plan we discussed and let me know if I can assist you in the future.   Screening recommendations/referrals: Colonoscopy : N/A  Mammogram : Up to date  Bone Density  : Up to date  Recommended yearly ophthalmology/optometry visit for glaucoma screening and checkup Recommended yearly dental visit for hygiene and checkup  Vaccinations: Influenza vaccine- due annually in September/October Pneumococcal vaccine  : Up to date  Tdap vaccine : due please get vaccine at the pharmcy    Shingles vaccine : Due please get vaccine at the pharmacy     Advanced directives: No   Conditions/risks identified: Cardiac Risk Factors include: advanced age (>47men, >55 women);hypertension;sedentary lifestyle  Next appointment: 1 year   Preventive Care 60 Years and Older, Female Preventive care refers to lifestyle choices and visits with your health care provider that can promote health and wellness. What does preventive care include? A yearly physical exam. This is also called an annual well check. Dental exams once or twice a year. Routine eye exams. Ask your health care provider how often you should have your eyes checked. Personal lifestyle choices, including: Daily care of your teeth and gums. Regular physical activity. Eating a healthy diet. Avoiding tobacco and drug use. Limiting alcohol use. Practicing safe sex. Taking low-dose aspirin every day. Taking vitamin and mineral supplements as recommended by your health care provider. What happens during an annual well check? The services and screenings done by your health care provider during your annual well check will depend on your age, overall health, lifestyle risk factors, and family history of disease. Counseling  Your health care provider may ask you questions about  your: Alcohol use. Tobacco use. Drug use. Emotional well-being. Home and relationship well-being. Sexual activity. Eating habits. History of falls. Memory and ability to understand (cognition). Work and work Astronomer. Reproductive health. Screening  You may have the following tests or measurements: Height, weight, and BMI. Blood pressure. Lipid and cholesterol levels. These may be checked every 5 years, or more frequently if you are over 16 years old. Skin check. Lung cancer screening. You may have this screening every year starting at age 35 if you have a 30-pack-year history of smoking and currently smoke or have quit within the past 15 years. Fecal occult blood test (FOBT) of the stool. You may have this test every year starting at age 51. Flexible sigmoidoscopy or colonoscopy. You may have a sigmoidoscopy every 5 years or a colonoscopy every 10 years starting at age 9. Hepatitis C blood test. Hepatitis B blood test. Sexually transmitted disease (STD) testing. Diabetes screening. This is done by checking your blood sugar (glucose) after you have not eaten for a while (fasting). You may have this done every 1-3 years. Bone density scan. This is done to screen for osteoporosis. You may have this done starting at age 70. Mammogram. This may be done every 1-2 years. Talk to your health care provider about how often you should have regular mammograms. Talk with your health care provider about your test results, treatment options, and if necessary, the need for more tests. Vaccines  Your health care provider may recommend certain vaccines, such as: Influenza vaccine. This is recommended every year. Tetanus, diphtheria, and acellular pertussis (Tdap, Td) vaccine. You may need a Td booster every 10 years. Zoster vaccine. You may need this after  age 66. Pneumococcal 13-valent conjugate (PCV13) vaccine. One dose is recommended after age 73. Pneumococcal polysaccharide (PPSV23) vaccine.  One dose is recommended after age 97. Talk to your health care provider about which screenings and vaccines you need and how often you need them. This information is not intended to replace advice given to you by your health care provider. Make sure you discuss any questions you have with your health care provider. Document Released: 09/19/2015 Document Revised: 05/12/2016 Document Reviewed: 06/24/2015 Elsevier Interactive Patient Education  2017 [employer].  Fall Prevention in the Home Falls can cause injuries. They can happen to people of all ages. There are many things you can do to make your home safe and to help prevent falls. What can I do on the outside of my home? Regularly fix the edges of walkways and driveways and fix any cracks. Remove anything that might make you trip as you walk through a door, such as a raised step or threshold. Trim any bushes or trees on the path to your home. Use bright outdoor lighting. Clear any walking paths of anything that might make someone trip, such as rocks or tools. Regularly check to see if handrails are loose or broken. Make sure that both sides of any steps have handrails. Any raised decks and porches should have guardrails on the edges. Have any leaves, snow, or ice cleared regularly. Use sand or salt on walking paths during winter. Clean up any spills in your garage right away. This includes oil or grease spills. What can I do in the bathroom? Use night lights. Install grab bars by the toilet and in the tub and shower. Do not use towel bars as grab bars. Use non-skid mats or decals in the tub or shower. If you need to sit down in the shower, use a plastic, non-slip stool. Keep the floor dry. Clean up any water that spills on the floor as soon as it happens. Remove soap buildup in the tub or shower regularly. Attach bath mats securely with double-sided non-slip rug tape. Do not have throw rugs and other things on the floor that can make  you trip. What can I do in the bedroom? Use night lights. Make sure that you have a light by your bed that is easy to reach. Do not use any sheets or blankets that are too big for your bed. They should not hang down onto the floor. Have a firm chair that has side arms. You can use this for support while you get dressed. Do not have throw rugs and other things on the floor that can make you trip. What can I do in the kitchen? Clean up any spills right away. Avoid walking on wet floors. Keep items that you use a lot in easy-to-reach places. If you need to reach something above you, use a strong step stool that has a grab bar. Keep electrical cords out of the way. Do not use floor polish or wax that makes floors slippery. If you must use wax, use non-skid floor wax. Do not have throw rugs and other things on the floor that can make you trip. What can I do with my stairs? Do not leave any items on the stairs. Make sure that there are handrails on both sides of the stairs and use them. Fix handrails that are broken or loose. Make sure that handrails are as long as the stairways. Check any carpeting to make sure that it is firmly attached to the stairs.  Fix any carpet that is loose or worn. Avoid having throw rugs at the top or bottom of the stairs. If you do have throw rugs, attach them to the floor with carpet tape. Make sure that you have a light switch at the top of the stairs and the bottom of the stairs. If you do not have them, ask someone to add them for you. What else can I do to help prevent falls? Wear shoes that: Do not have high heels. Have rubber bottoms. Are comfortable and fit you well. Are closed at the toe. Do not wear sandals. If you use a stepladder: Make sure that it is fully opened. Do not climb a closed stepladder. Make sure that both sides of the stepladder are locked into place. Ask someone to hold it for you, if possible. Clearly mark and make sure that you can  see: Any grab bars or handrails. First and last steps. Where the edge of each step is. Use tools that help you move around (mobility aids) if they are needed. These include: Canes. Walkers. Scooters. Crutches. Turn on the lights when you go into a dark area. Replace any light bulbs as soon as they burn out. Set up your furniture so you have a clear path. Avoid moving your furniture around. If any of your floors are uneven, fix them. If there are any pets around you, be aware of where they are. Review your medicines with your doctor. Some medicines can make you feel dizzy. This can increase your chance of falling. Ask your doctor what other things that you can do to help prevent falls. This information is not intended to replace advice given to you by your health care provider. Make sure you discuss any questions you have with your health care provider. Document Released: 06/19/2009 Document Revised: 01/29/2016 Document Reviewed: 09/27/2014 Elsevier Interactive Patient Education  2017 [employer].

## 2023-03-24 DIAGNOSIS — H04123 Dry eye syndrome of bilateral lacrimal glands: Secondary | ICD-10-CM | POA: Diagnosis not present

## 2023-03-24 DIAGNOSIS — H401132 Primary open-angle glaucoma, bilateral, moderate stage: Secondary | ICD-10-CM | POA: Diagnosis not present

## 2023-03-28 ENCOUNTER — Other Ambulatory Visit: Payer: Self-pay | Admitting: Family

## 2023-04-18 ENCOUNTER — Other Ambulatory Visit: Payer: Self-pay | Admitting: Family

## 2023-05-05 IMAGING — MR MR HEAD W/O CM
10 of 11 series · 42 of 48 positions shown · non-contrast
Comparison: Prior CT from earlier the same day.

CLINICAL DATA: Initial evaluation for neuro deficit, stroke
suspected.

EXAM:
MRI HEAD WITHOUT CONTRAST
TECHNIQUE: Multiplanar, multiecho pulse sequences of the brain and surrounding
structures were obtained without intravenous contrast.

[Series 5: DWI · axial · 3.0mm · 0.88mm/px · z∈[-120,+5]mm · 9 of 96 slices shown (1 of 4)]
[im 1/96]
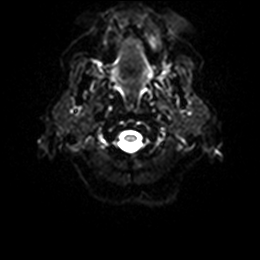
[im 12/96]
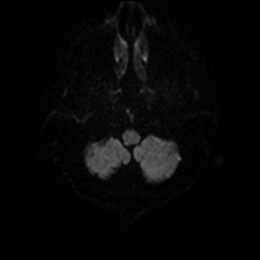
[im 24/96]
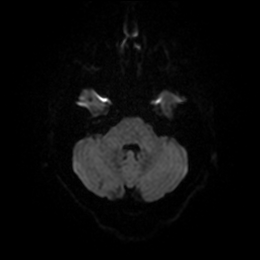
[im 36/96]
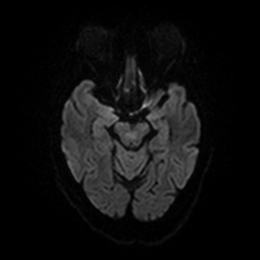
[im 48/96]
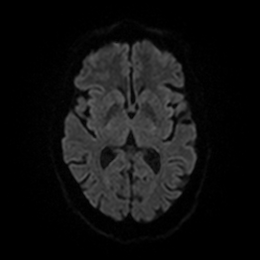
[im 60/96]
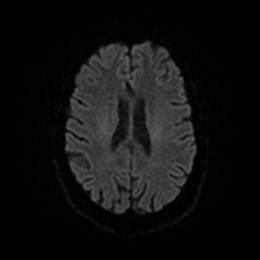
[im 72/96]
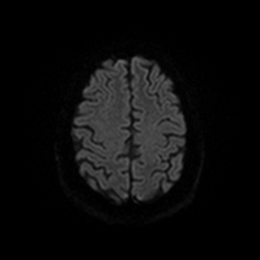
[im 84/96]
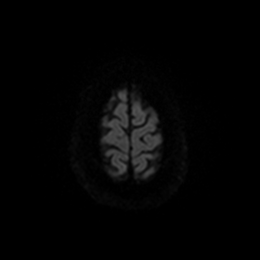
[im 96/96]
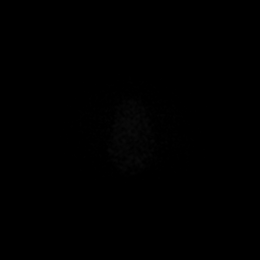

[Series 6: DWI · axial · 3.0mm · 0.88mm/px · z∈[-120,+5]mm · 4 of 48 slices shown (2 of 4)]
[im 1/48]
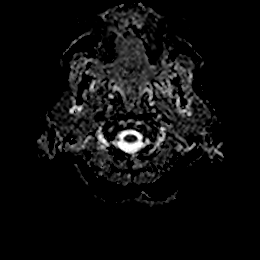
[im 16/48]
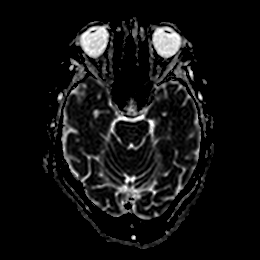
[im 32/48]
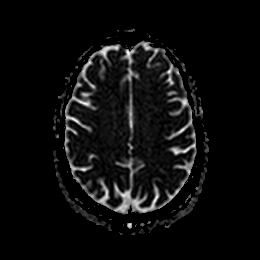
[im 48/48]
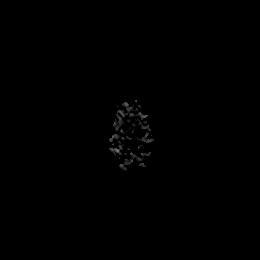

[Series 7: DWI · coronal · 4.0mm · 0.88mm/px · 6 of 64 slices shown (3 of 4)]
[im 1/64]
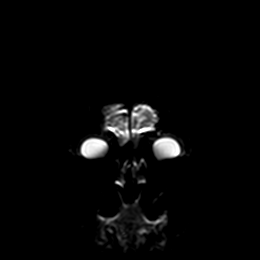
[im 13/64]
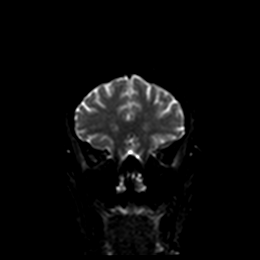
[im 26/64]
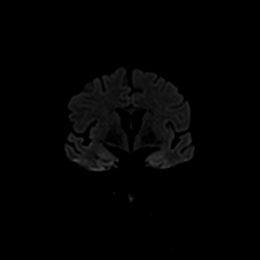
[im 38/64]
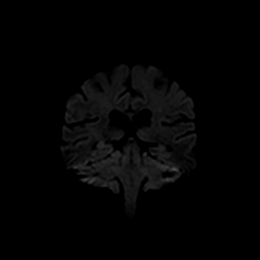
[im 51/64]
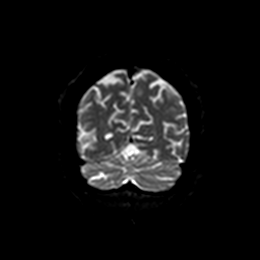
[im 64/64]
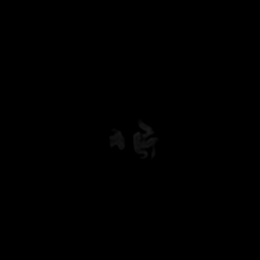

[Series 8: DWI · coronal · 4.0mm · 0.88mm/px · 3 of 32 slices shown (4 of 4)]
[im 1/32]
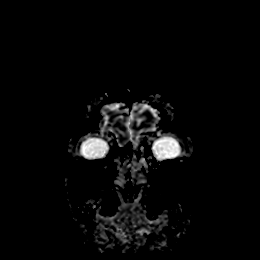
[im 16/32]
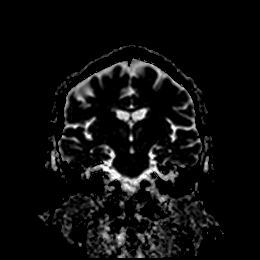
[im 32/32]
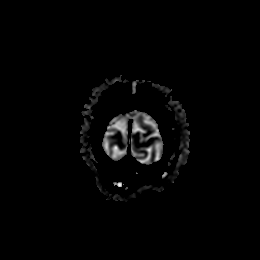

[Series 9: T1 · sagittal · 5.0mm · 0.75mm/px · 2 of 23 slices shown]
[im 1/23]
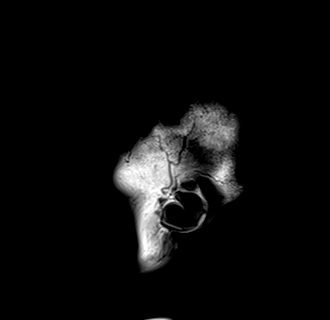
[im 23/23]
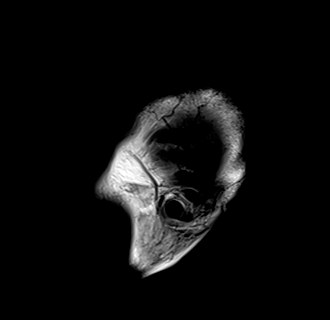

[Series 10: T2 · axial · 5.0mm · 0.72mm/px · z∈[-121,+7]mm · 2 of 25 slices shown (1 of 2)]
[im 1/25]
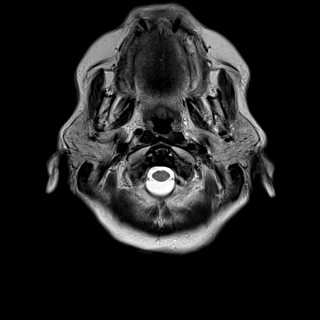
[im 25/25]
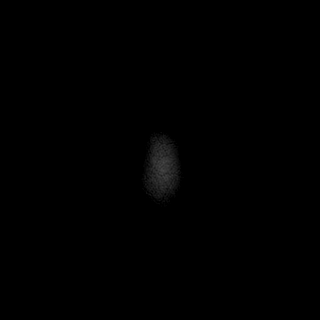

[Series 11: FLAIR · axial · 5.0mm · 0.45mm/px · z∈[-123,+5]mm · 2 of 25 slices shown]
[im 1/25]
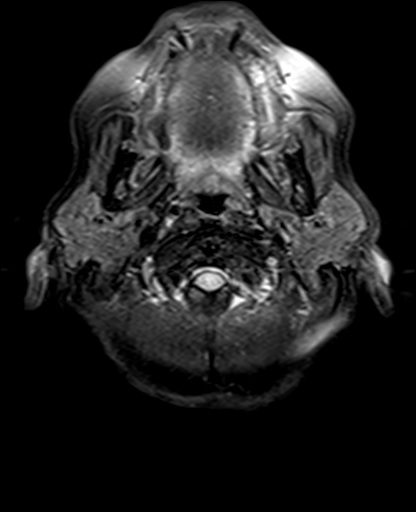
[im 25/25]
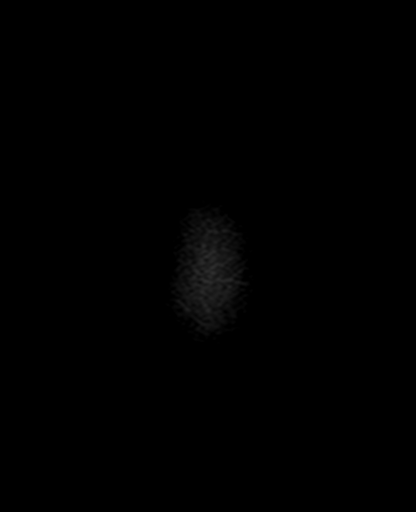

[Series 13: pha_images · axial · 3.0mm · 0.90mm/px · z∈[-137,+9]mm · 5 of 56 slices shown]
[im 1/56]
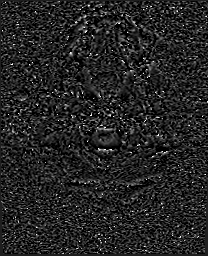
[im 14/56]
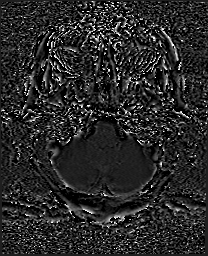
[im 28/56]
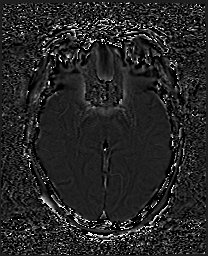
[im 42/56]
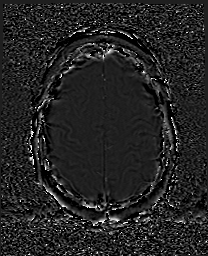
[im 56/56]
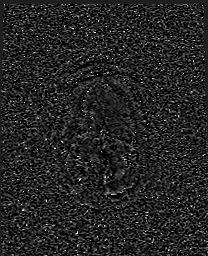

[Series 14: swi_images · axial · 3.0mm · 0.90mm/px · z∈[-137,+20]mm · 6 of 60 slices shown]
[im 1/60]
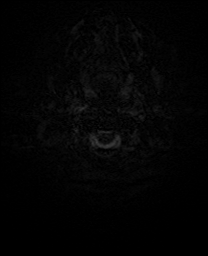
[im 12/60]
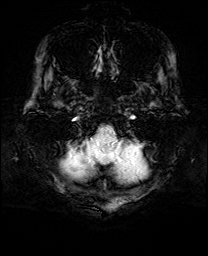
[im 24/60]
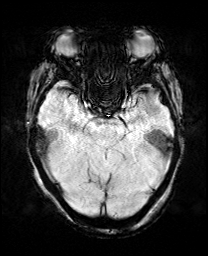
[im 36/60]
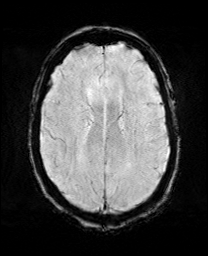
[im 48/60]
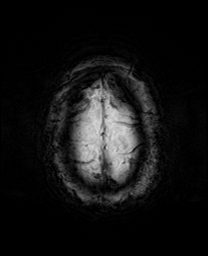
[im 60/60]
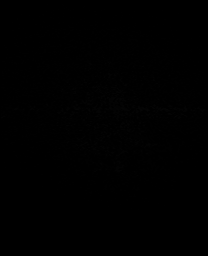

[Series 17: T2 · coronal · 5.0mm · 0.34mm/px · 3 of 29 slices shown (2 of 2)]
[im 1/29]
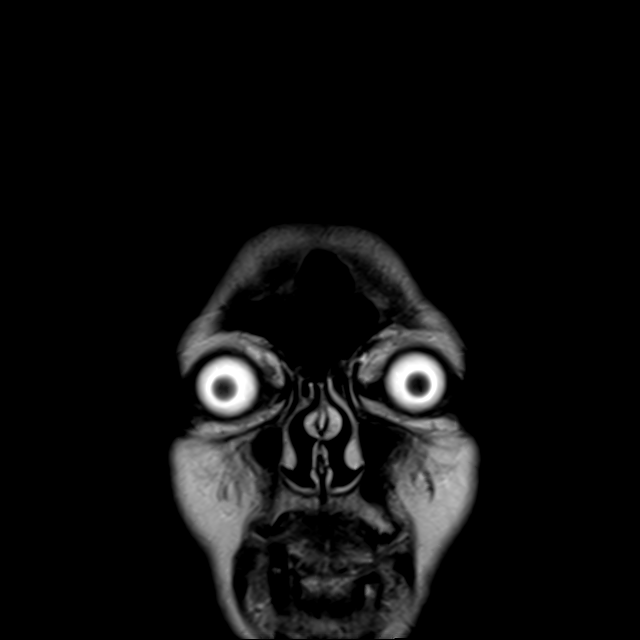
[im 15/29]
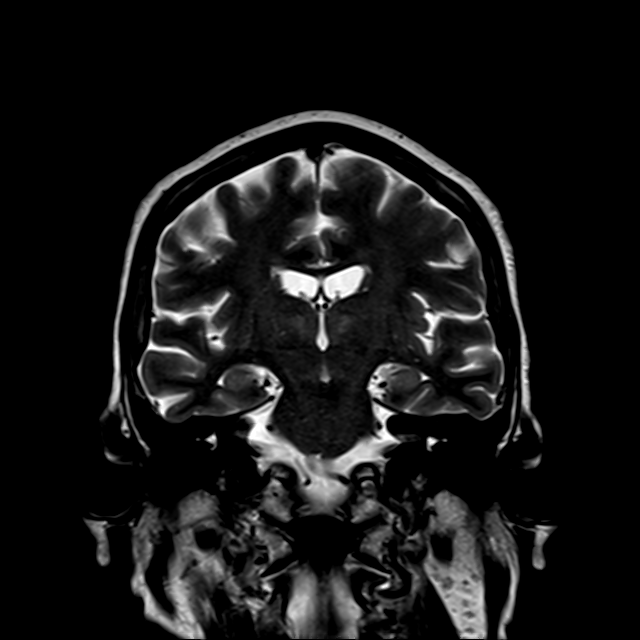
[im 29/29]
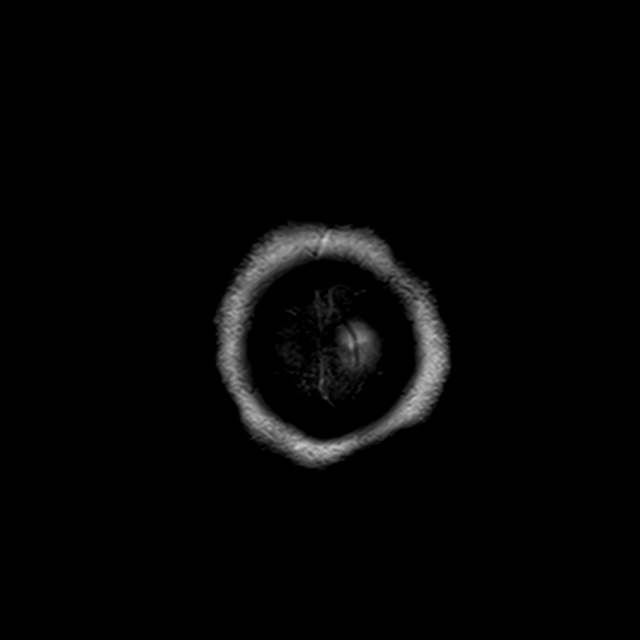

[42 of 48 positions shown; findings below may reference images not displayed]

FINDINGS: Brain: Cerebral volume within normal limits. Minimal chronic
microvascular ischemic disease noted involving the periventricular
white matter. Few scattered small remote right cerebellar infarcts.

No abnormal foci of restricted diffusion to suggest acute or
subacute ischemia. Gray-white matter differentiation maintained. No
encephalomalacia to suggest chronic cortical infarction elsewhere
within the brain. No other evidence for acute or chronic
intracranial hemorrhage.

No mass lesion, midline shift or mass effect. No hydrocephalus or
extra-axial fluid collection. Pituitary gland suprasellar region
normal. Midline structures intact.

Vascular: Major intracranial vascular flow voids are maintained.

Skull and upper cervical spine: Craniocervical junction within
normal limits. Degenerative spondylosis noted at C3-4 with
associated mild to moderate spinal stenosis. Bone marrow signal
intensity normal. No scalp soft tissue abnormality.

Sinuses/Orbits: Globes and orbital soft tissues within normal
limits. Paranasal sinuses are clear. No mastoid effusion.

Other: None.
IMPRESSION: 1. No acute intracranial abnormality.
2. Few scattered small remote right cerebellar infarcts.
3. Underlying mild chronic microvascular ischemic disease for age.
4. Degenerative spondylosis at C3-4 with resultant mild to moderate
spinal stenosis. Finding could be further assessed with dedicated
MRI of the cervical spine as clinically warranted.

## 2023-05-05 IMAGING — CT CT HEAD W/O CM
4 series · 16 of 47 positions shown, 18 images · non-contrast
Comparison: None.

CLINICAL DATA: Altered mental status

EXAM:
CT HEAD WITHOUT CONTRAST
TECHNIQUE: Contiguous axial images were obtained from the base of the skull
through the vertex without intravenous contrast.

[Series 3: head without · axial · non-contrast · 0.39mm/px · z∈[+1318,+1438]mm · 7 of 33 slices shown, 9 images]
[im 5/33  brain]
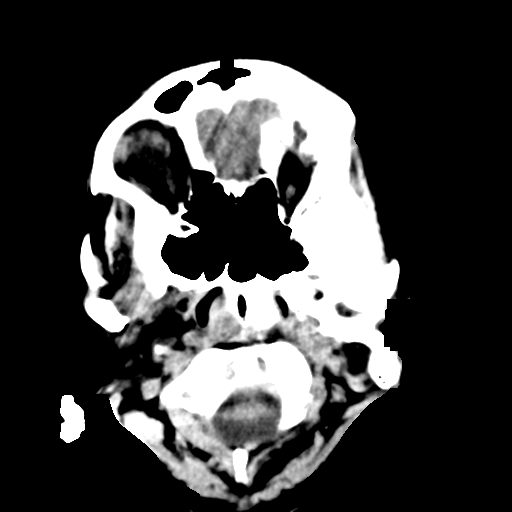
[im 5/33  bone]
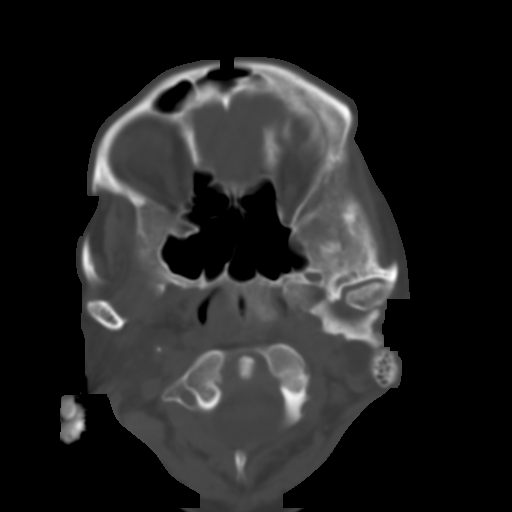
[im 9/33  brain]
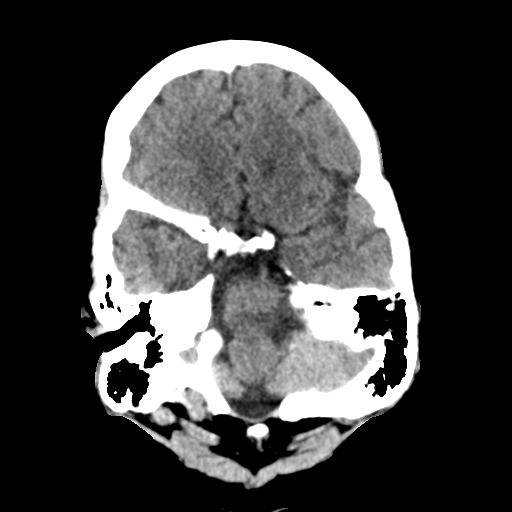
[im 13/33  brain]
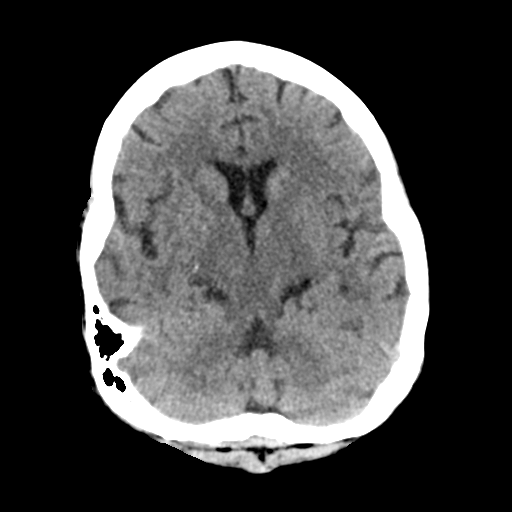
[im 17/33  brain]
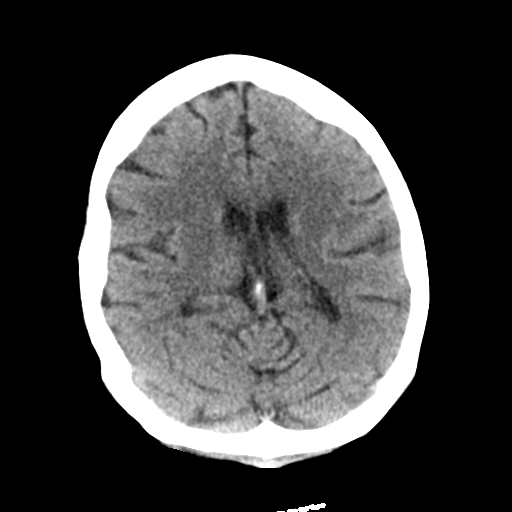
[im 21/33  brain]
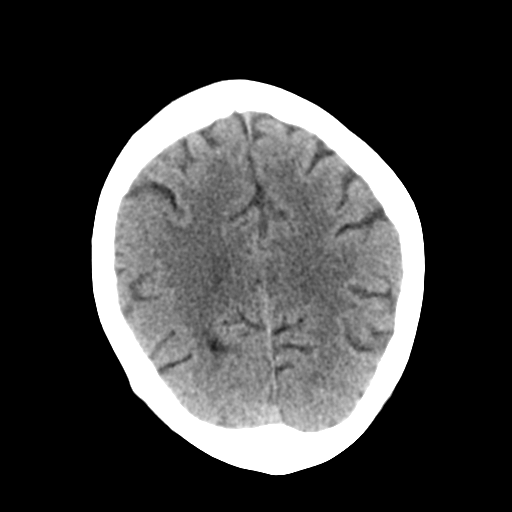
[im 21/33  bone]
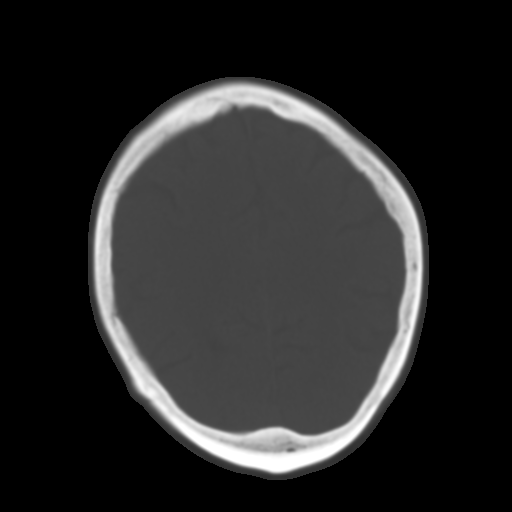
[im 25/33  brain]
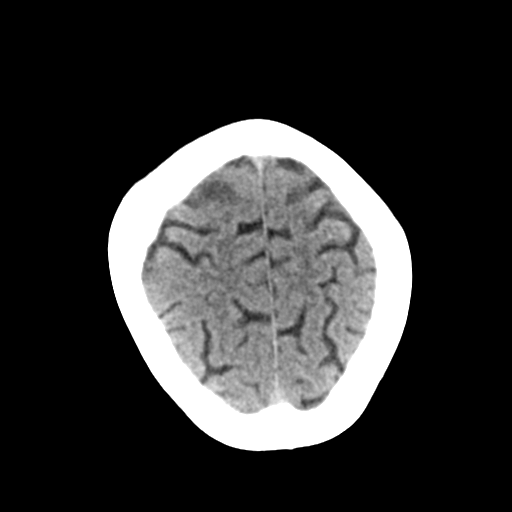
[im 29/33  brain]
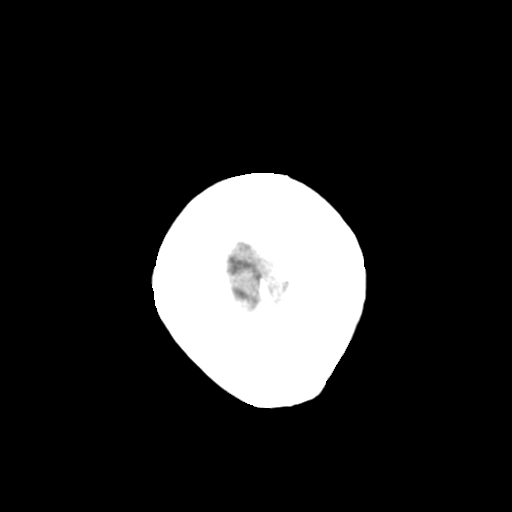

[Series 4: head bone · axial · 0.39mm/px · z∈[+1314,+1346]mm · 3 of 82 slices shown]
[im 9/82  bone]
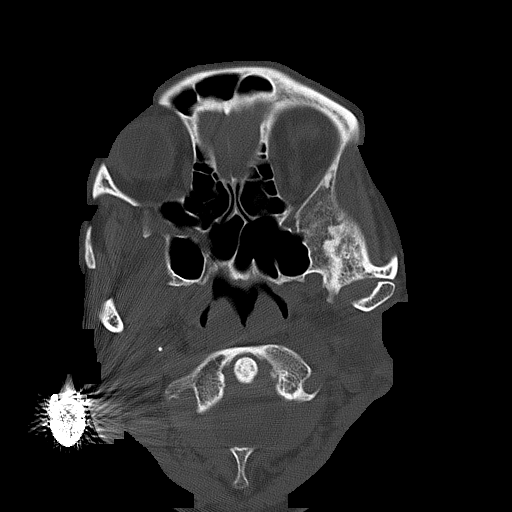
[im 17/82  bone]
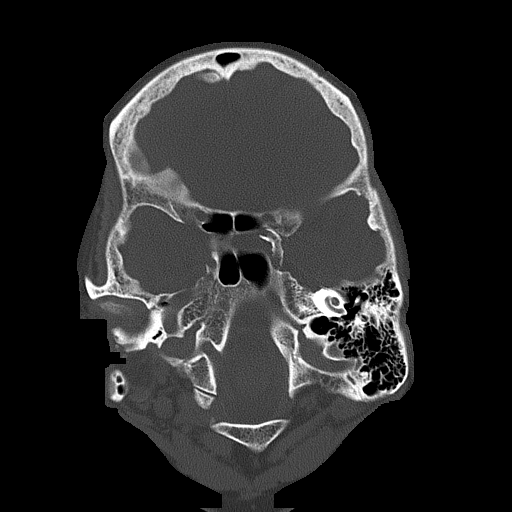
[im 25/82  bone]
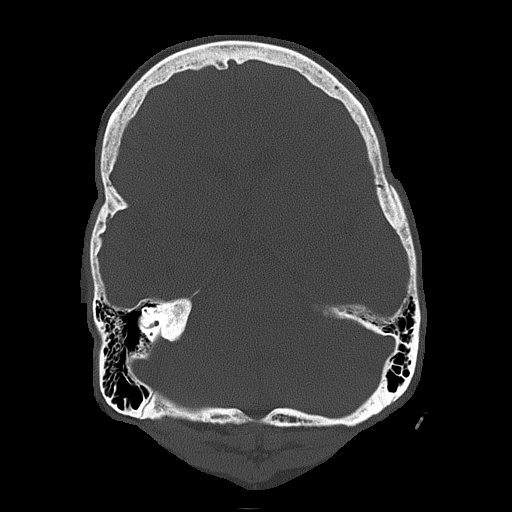

[Series 5: head without cor · coronal · non-contrast · 0.27mm/px · 3 of 67 slices shown]
[im 23/67  brain]
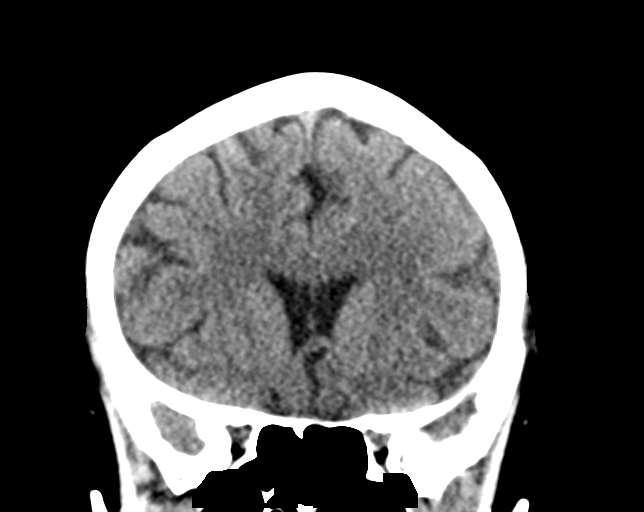
[im 30/67  brain]
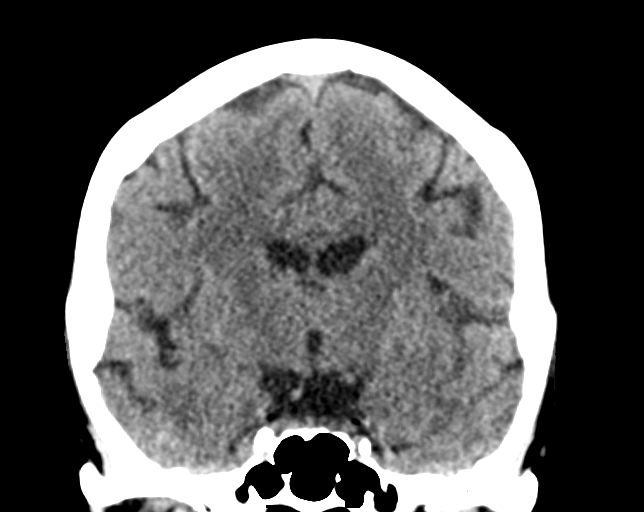
[im 37/67  brain]
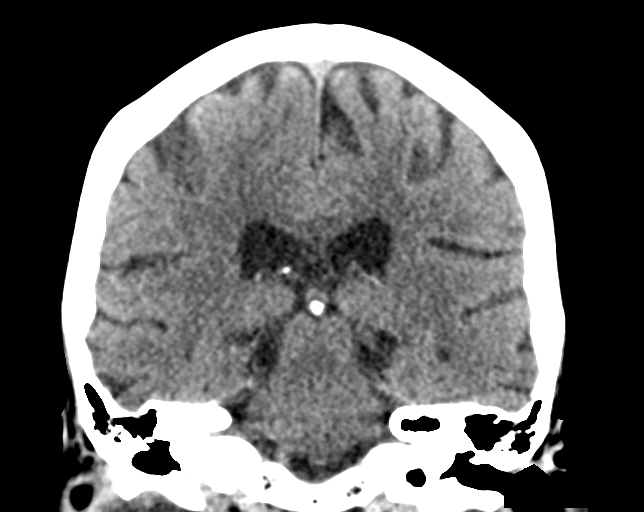

[Series 6: head without sag · sagittal · non-contrast · 0.28mm/px · 3 of 53 slices shown]
[im 18/53  brain]
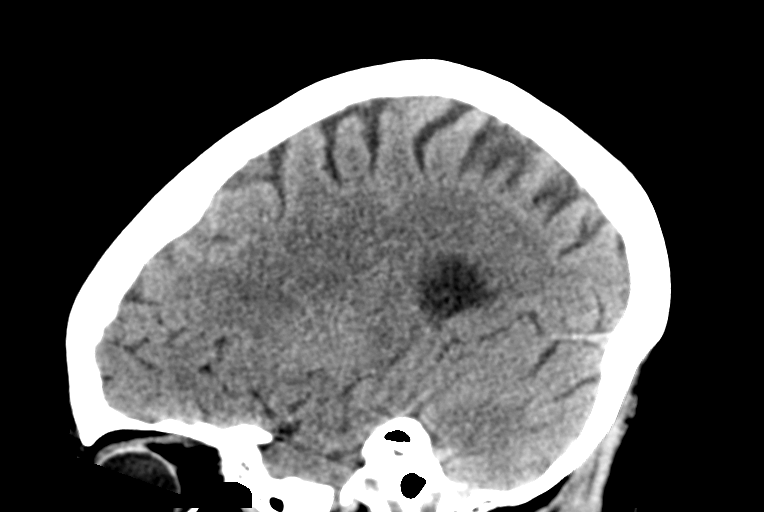
[im 27/53  brain]
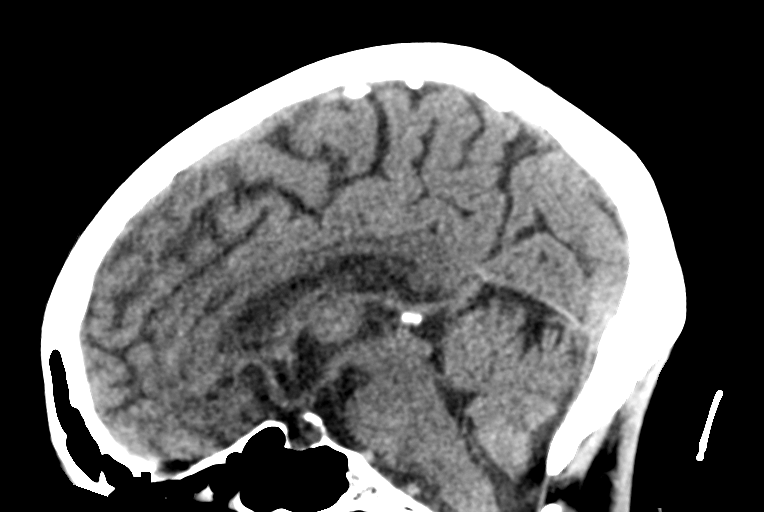
[im 35/53  brain]
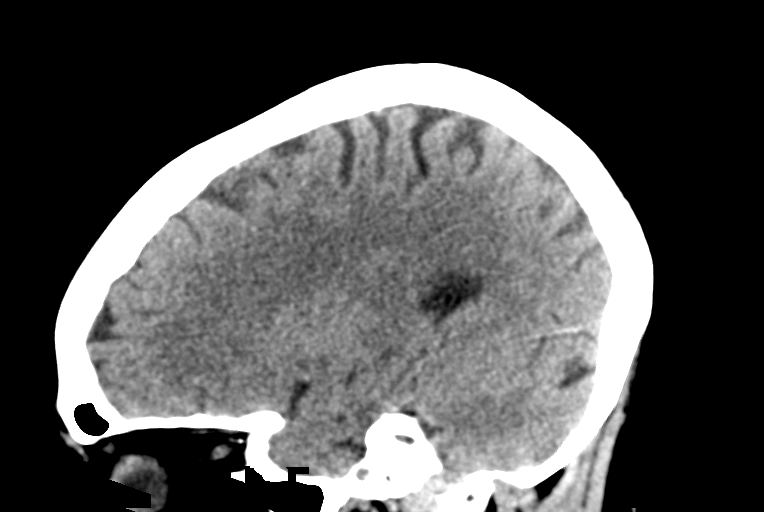

[16 of 47 positions shown; findings below may reference images not displayed]

FINDINGS: Brain: No evidence of acute infarction, hemorrhage, hydrocephalus,
extra-axial collection or mass lesion/mass effect.

Vascular: No hyperdense vessel or unexpected calcification.

Skull: Normal. Negative for fracture or focal lesion.

Sinuses/Orbits: No acute finding.

Other: None.
IMPRESSION: No acute intracranial abnormality.

## 2023-05-10 ENCOUNTER — Other Ambulatory Visit: Payer: Medicare Other

## 2023-05-10 DIAGNOSIS — E11319 Type 2 diabetes mellitus with unspecified diabetic retinopathy without macular edema: Secondary | ICD-10-CM | POA: Diagnosis not present

## 2023-05-11 LAB — HEMOGLOBIN A1C
Hgb A1c MFr Bld: 6.4 %{Hb} — ABNORMAL HIGH (ref <5.7)
Mean Plasma Glucose: 137 mg/dL
eAG (mmol/L): 7.6 mmol/L

## 2023-05-21 IMAGING — US US RENAL
1 series · 14 of 25 positions shown · non-contrast
Comparison: CT abdomen and pelvis September 28, 2016

CLINICAL DATA: Hematuria and urinary frequency

EXAM:
RENAL / URINARY TRACT ULTRASOUND COMPLETE

[Series 2: us renal · 14 of 247 slices shown]
[im 1/247]
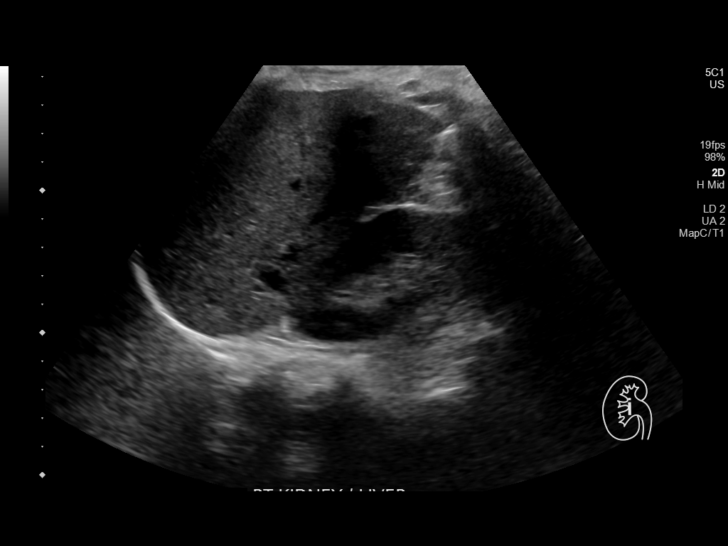
[im 21/247]
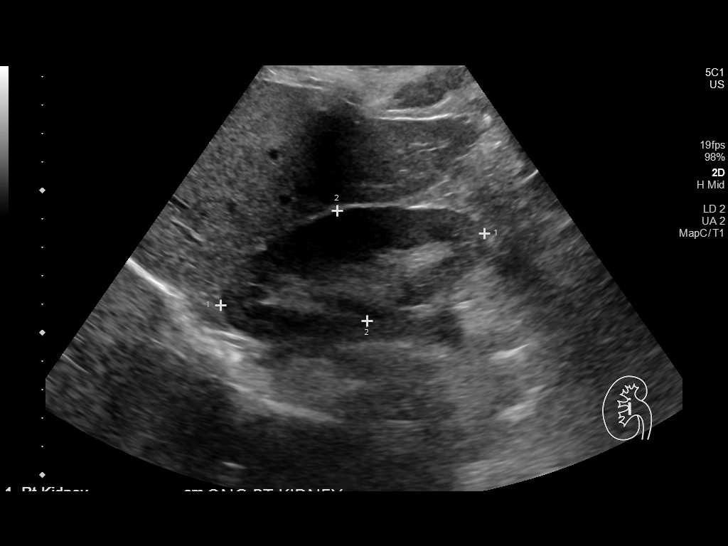
[im 42/247]
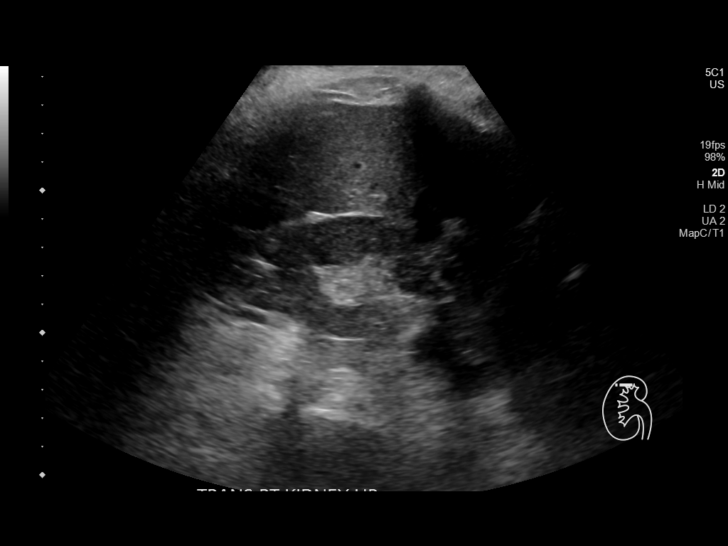
[im 62/247]
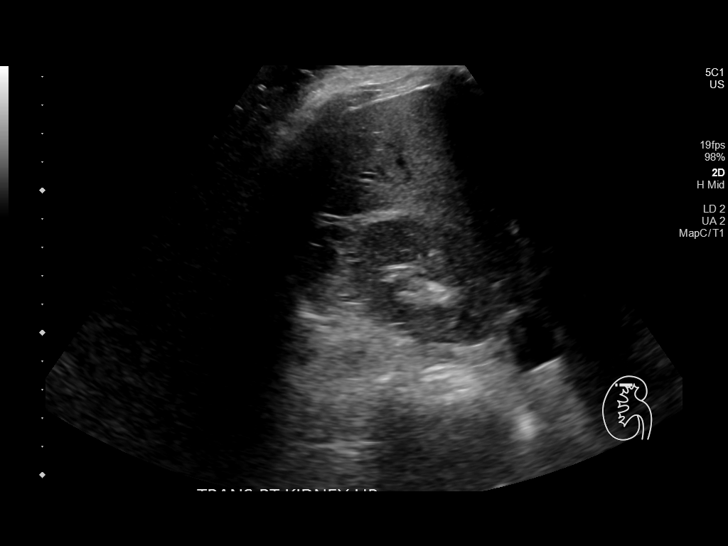
[im 83/247]
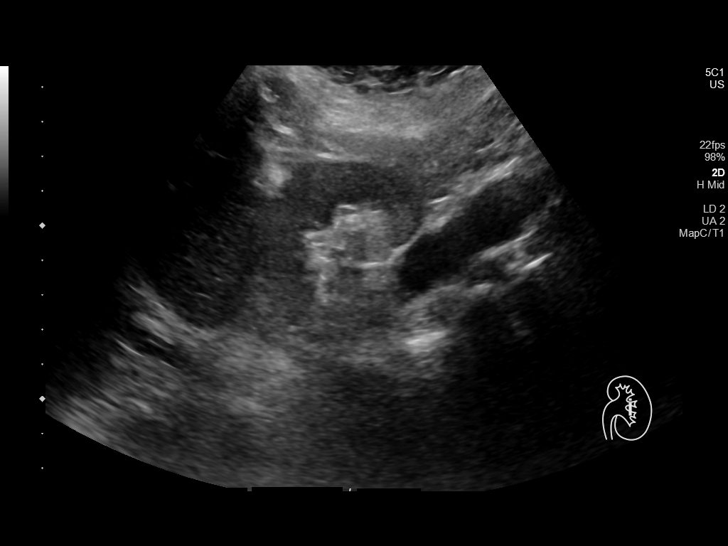
[im 93/247]
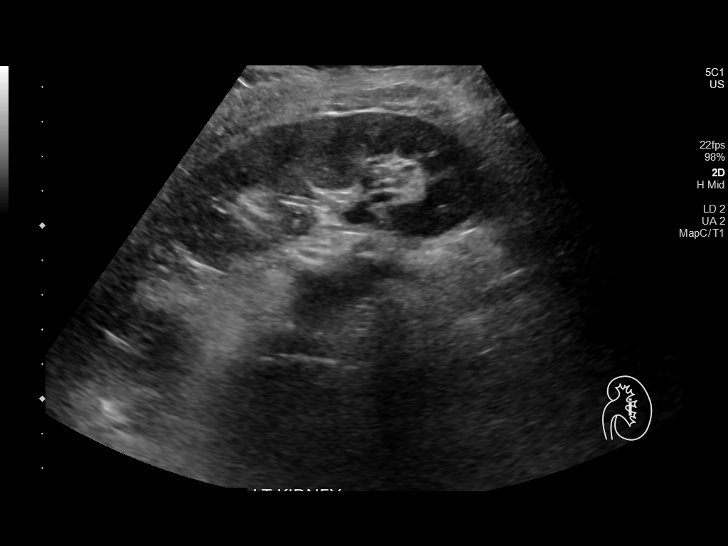
[im 113/247]
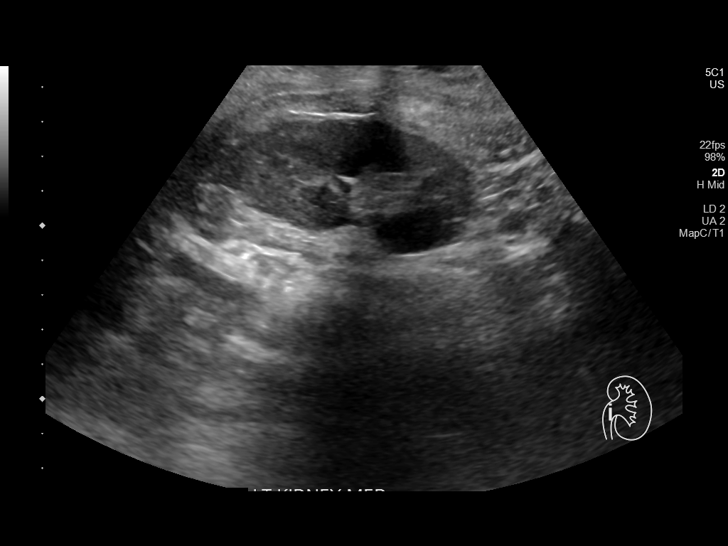
[im 134/247]
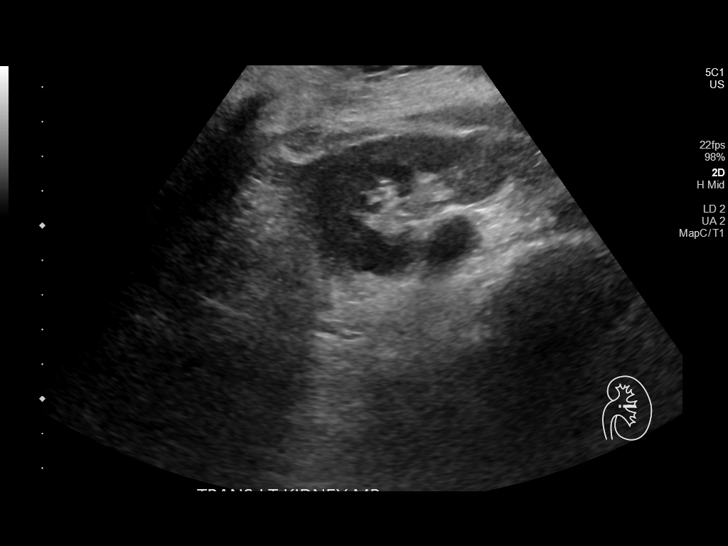
[im 154/247]
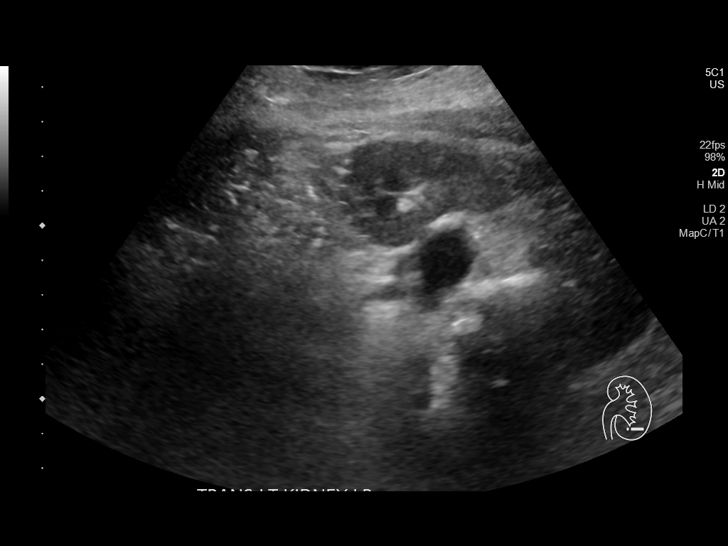
[im 165/247]
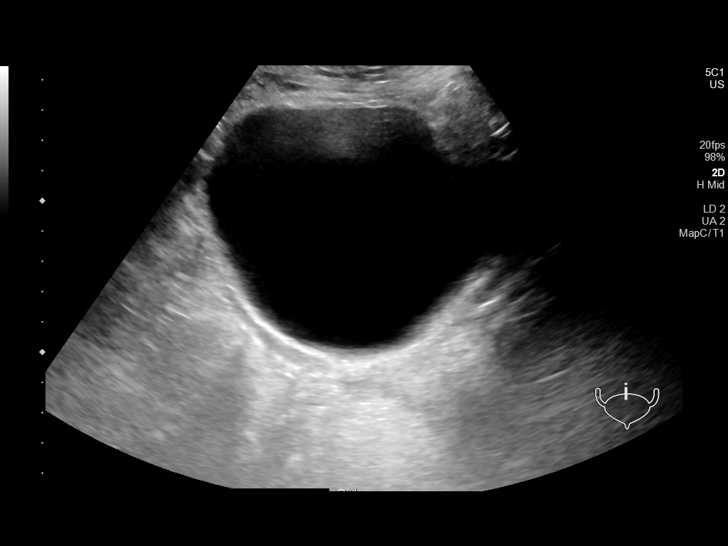
[im 185/247]
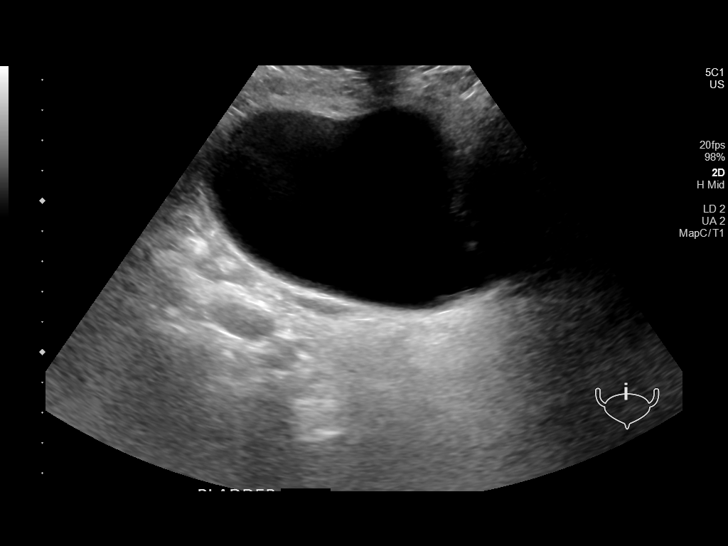
[im 206/247]
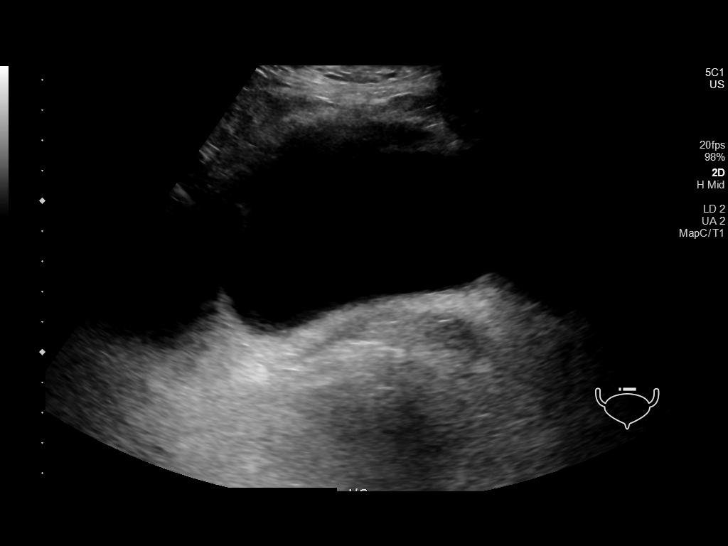
[im 226/247]
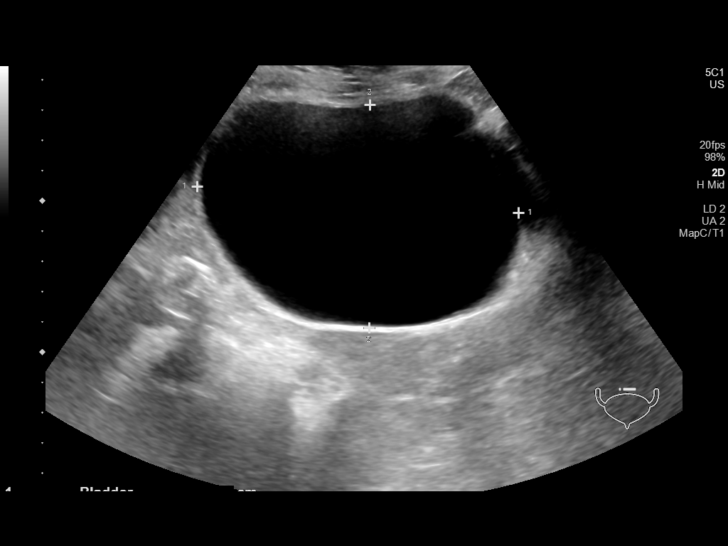
[im 247/247]
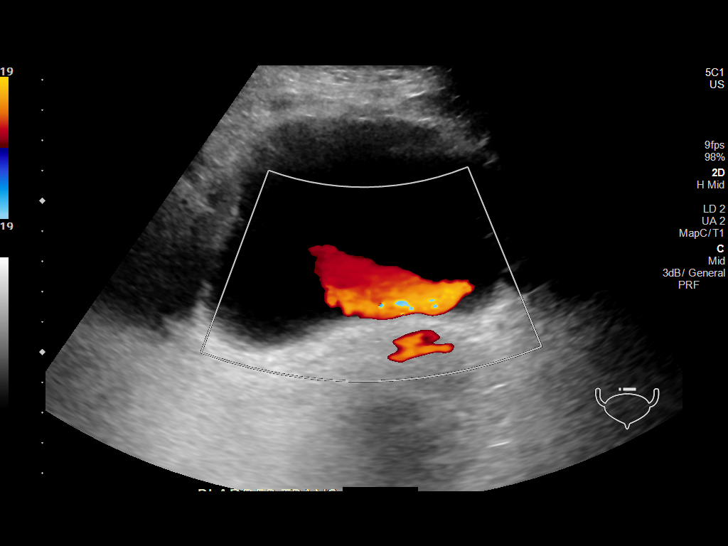

[14 of 25 positions shown; findings below may reference images not displayed]

FINDINGS: Right Kidney:

Renal measurements: 9.6 x 4.0 x 4.8 cm = volume: 97 mL. Echogenicity
and renal cortical thickness are within normal limits. No mass,
perinephric fluid, or hydronephrosis visualized. No sonographically
demonstrable calculus or ureterectasis.

Left Kidney:

Renal measurements: 9.0 x 3.2 x 4.3 cm = volume: 66 mL. Echogenicity
and renal cortical thickness are within normal limits. No mass,
perinephric fluid, or hydronephrosis visualized. No sonographically
demonstrable calculus or ureterectasis.

Bladder:

Appears normal for degree of bladder distention.

Other:

None.
IMPRESSION: Study within normal limits.

## 2023-06-23 DIAGNOSIS — H401131 Primary open-angle glaucoma, bilateral, mild stage: Secondary | ICD-10-CM | POA: Diagnosis not present

## 2023-06-23 DIAGNOSIS — H04123 Dry eye syndrome of bilateral lacrimal glands: Secondary | ICD-10-CM | POA: Diagnosis not present

## 2023-06-28 ENCOUNTER — Other Ambulatory Visit: Payer: Self-pay | Admitting: Family

## 2023-06-28 DIAGNOSIS — F29 Unspecified psychosis not due to a substance or known physiological condition: Secondary | ICD-10-CM

## 2023-06-28 NOTE — Telephone Encounter (Signed)
Abilify refilled.  °

## 2023-07-20 ENCOUNTER — Encounter: Payer: Self-pay | Admitting: Family

## 2023-07-20 ENCOUNTER — Ambulatory Visit (INDEPENDENT_AMBULATORY_CARE_PROVIDER_SITE_OTHER): Payer: Medicare Other | Admitting: Family

## 2023-07-20 VITALS — BP 126/82 | HR 89 | Temp 96.8°F | Resp 18 | Ht 63.5 in | Wt 165.8 lb

## 2023-07-20 DIAGNOSIS — E782 Mixed hyperlipidemia: Secondary | ICD-10-CM

## 2023-07-20 DIAGNOSIS — E11319 Type 2 diabetes mellitus with unspecified diabetic retinopathy without macular edema: Secondary | ICD-10-CM | POA: Diagnosis not present

## 2023-07-20 DIAGNOSIS — E538 Deficiency of other specified B group vitamins: Secondary | ICD-10-CM | POA: Diagnosis not present

## 2023-07-20 DIAGNOSIS — I10 Essential (primary) hypertension: Secondary | ICD-10-CM | POA: Diagnosis not present

## 2023-07-20 DIAGNOSIS — N183 Chronic kidney disease, stage 3 unspecified: Secondary | ICD-10-CM

## 2023-07-20 NOTE — Progress Notes (Signed)
Provider: Richarda Blade FNP-C   Severn Goddard, Donalee Citrin, NP  Patient Care Team: Loc Feinstein, Donalee Citrin, NP as PCP - General (Family Medicine)  Extended Emergency Contact Information Primary Emergency Contact: Renaldo Fidd[place] Mobile Phone: [phone] Relation: Daughter Secondary Emergency Contact: Bedonie,Edward  [place] Home Phone: [phone] Relation: Spouse  Code Status:  Full Code  Goals of care: Advanced Directive information    01/12/2023    2:57 PM  Advanced Directives  Does Patient Have a Medical Advance Directive? Yes  Type of Advance Directive Healthcare Power of Attorney  Does patient want to make changes to medical advance directive? No - Patient declined  Copy of Healthcare Power of Attorney in Chart? No - copy requested     Chief Complaint  Patient presents with   Medical Management of Chronic Issues    Patient presents today for a 6 month follow-up   Quality Metric Gaps    Urine microalbumin, TDAP, zoster, COVID#4    HPI:  Pt is a 77 y.o. female seen today for 6 months follow up for medical management of chronic diseases.    Hypertension - no home B/p readings.readings normal today.she denies any headache,dizziness,vision changes,fatigue,chest tightness,palpitation,chest pain or shortness of breath.     Hyperlipidemia - on Crestor no side effects reported.   Dementia - stable states still cooks and does house chores but does not drive.Husband drives.  Right knee pain - takes tylenol for pain.Declines X-ray and referral to orthopedic.No fall episode   Immunization - Due for Tetanus ,Shingles and COVID-19 vaccine.made away aware to get at the Pharmacy.   Past Medical History:  Diagnosis Date   Abdominal aortic aneurysm without rupture (HCC)    noted on imaging 2018   Anemia    Arthritis    Atherosclerosis of aorta (HCC)    Colon polyps    GERD (gastroesophageal reflux disease)    Hemorrhoids    Hiatal hernia     HLD (hyperlipidemia)    Hypertension    Osteopenia    Osteopenia    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HCC)    Pure hypercholesterolemia    Scoliosis    Vitamin D deficiency    Past Surgical History:  Procedure Laterality Date   COLONOSCOPY N/A 09/29/2016   Procedure: COLONOSCOPY;  Surgeon: Hilarie Fredrickson, MD;  Location: [facility];  Service: Endoscopy;  Laterality: N/A;   ESOPHAGOGASTRODUODENOSCOPY N/A 09/29/2016   Procedure: ESOPHAGOGASTRODUODENOSCOPY (EGD);  Surgeon: Hilarie Fredrickson, MD;  Location: [facility];  Service: Endoscopy;  Laterality: N/A;   NO PAST SURGERIES      No Known Allergies  Allergies as of 07/20/2023   No Known Allergies      Medication List        Accurate as of July 20, 2023  3:50 PM. If you have any questions, ask your nurse or doctor.          acetaminophen 500 MG tablet Commonly known as: TYLENOL Take 1,000 mg by mouth every 6 (six) hours as needed (pain).   amLODipine 10 MG tablet Commonly known as: NORVASC TAKE 1 TABLET (10 MG TOTAL) BY MOUTH EVERY MORNING.   ARIPiprazole 10 MG tablet Commonly known as: ABILIFY TAKE 1 TABLET BY MOUTH EVERY DAY   BIOFREEZE EX Apply 1 application topically daily as needed (arthritis/knee pain).   cyanocobalamin 1000 MCG/ML injection Commonly known as: VITAMIN B12 Inject 1,000 mcg into the muscle every 30 (thirty) days.   docusate sodium 100 MG capsule  Commonly known as: COLACE Take 100 mg by mouth daily as needed for mild constipation.   losartan-hydrochlorothiazide 50-12.5 MG tablet Commonly known as: HYZAAR TAKE 1 TABLET BY MOUTH EVERYDAY AT BEDTIME   melatonin 3 MG Tabs tablet Take 1 tablet (3 mg total) by mouth at bedtime.   memantine 5 MG tablet Commonly known as: NAMENDA TAKE 1 TABLET (5 MG TOTAL) BY MOUTH DAILY.   Rocklatan 0.02-0.005 % Soln Generic drug: Netarsudil-Latanoprost Place 1 drop into both eyes at bedtime.   rosuvastatin 10 MG tablet Commonly known as:  CRESTOR TAKE 1 TABLET BY MOUTH EVERY DAY   sertraline 25 MG tablet Commonly known as: ZOLOFT TAKE 1 TABLET (25 MG TOTAL) BY MOUTH DAILY.   Vitamin D-3 125 MCG (5000 UT) Tabs Take 5,000 Units by mouth daily.        Review of Systems  Constitutional:  Negative for appetite change, chills, fatigue, fever and unexpected weight change.  HENT:  Negative for congestion, dental problem, ear discharge, ear pain, facial swelling, hearing loss, nosebleeds, postnasal drip, rhinorrhea, sinus pressure, sinus pain, sneezing, sore throat, tinnitus and trouble swallowing.   Eyes:  Negative for pain, discharge, redness, itching and visual disturbance.  Respiratory:  Negative for cough, chest tightness, shortness of breath and wheezing.   Cardiovascular:  Negative for chest pain, palpitations and leg swelling.  Gastrointestinal:  Negative for abdominal distention, abdominal pain, blood in stool, constipation, diarrhea, nausea and vomiting.  Endocrine: Negative for cold intolerance, heat intolerance, polydipsia, polyphagia and polyuria.  Genitourinary:  Negative for difficulty urinating, dysuria, flank pain, frequency and urgency.  Musculoskeletal:  Positive for arthralgias. Negative for back pain, gait problem, joint swelling, myalgias, neck pain and neck stiffness.       Right knee pain   Skin:  Negative for color change, pallor, rash and wound.  Neurological:  Negative for dizziness, syncope, speech difficulty, weakness, light-headedness, numbness and headaches.  Hematological:  Does not bruise/bleed easily.  Psychiatric/Behavioral:  Negative for agitation, behavioral problems, confusion, hallucinations, self-injury, sleep disturbance and suicidal ideas. The patient is not nervous/anxious.        Memory loss     Immunization History  Administered Date(s) Administered   Influenza, Quadrivalent, Recombinant, Inj, Pf 10/09/2018   Influenza,inj,quad, With Preservative 10/02/2015   PFIZER(Purple  Top)SARS-COV-2 Vaccination 11/26/2019, 12/17/2019, 09/03/2020   PNEUMOCOCCAL CONJUGATE-20 07/14/2022   Pneumococcal Conjugate-13 10/02/2015   Pertinent  Health Maintenance Due  Topic Date Due   DEXA SCAN  Completed   INFLUENZA VACCINE  Discontinued   Colonoscopy  Discontinued      10/06/2021    4:31 PM 04/12/2022    2:14 PM 01/12/2023    2:57 PM 03/17/2023    3:09 PM 07/20/2023    3:22 PM  Fall Risk  Falls in the past year? 0 0 0 0 0  Was there an injury with Fall? 0 0 0 0 0  Fall Risk Category Calculator 0 0 0 0 0  Fall Risk Category (Retired) Low Low     (RETIRED) Patient Fall Risk Level Low fall risk Low fall risk     Patient at Risk for Falls Due to No Fall Risks No Fall Risks No Fall Risks No Fall Risks No Fall Risks  Fall risk Follow up Falls evaluation completed Falls evaluation completed Falls evaluation completed Falls evaluation completed Falls evaluation completed   Functional Status Survey:    Vitals:   07/20/23 1524  BP: 126/82  Pulse: 89  Resp: 18  Temp: (!)  96.8 F (36 C)  SpO2: 98%  Weight: 165 lb 12.8 oz (75.2 kg)  Height: 5' 3.5" (1.613 m)   Body mass index is 28.91 kg/m. Physical Exam Vitals reviewed.  Constitutional:      General: She is not in acute distress.    Appearance: Normal appearance. She is overweight. She is not ill-appearing or diaphoretic.  HENT:     Head: Normocephalic.     Right Ear: Tympanic membrane, ear canal and external ear normal. There is no impacted cerumen.     Left Ear: Tympanic membrane, ear canal and external ear normal. There is no impacted cerumen.     Nose: Nose normal. No congestion or rhinorrhea.     Mouth/Throat:     Mouth: Mucous membranes are moist.     Pharynx: Oropharynx is clear. No oropharyngeal exudate or posterior oropharyngeal erythema.  Eyes:     General: No scleral icterus.       Right eye: No discharge.        Left eye: No discharge.     Extraocular Movements: Extraocular movements intact.      Conjunctiva/sclera: Conjunctivae normal.     Pupils: Pupils are equal, round, and reactive to light.  Neck:     Vascular: No carotid bruit.  Cardiovascular:     Rate and Rhythm: Normal rate and regular rhythm.     Pulses: Normal pulses.     Heart sounds: Normal heart sounds. No murmur heard.    No friction rub. No gallop.  Pulmonary:     Effort: Pulmonary effort is normal. No respiratory distress.     Breath sounds: Normal breath sounds. No wheezing, rhonchi or rales.  Chest:     Chest wall: No tenderness.  Abdominal:     General: Bowel sounds are normal. There is no distension.     Palpations: Abdomen is soft. There is no mass.     Tenderness: There is no abdominal tenderness. There is no right CVA tenderness, left CVA tenderness, guarding or rebound.  Musculoskeletal:        General: No swelling. Normal range of motion.     Cervical back: Normal range of motion. No rigidity or tenderness.     Right knee: Crepitus present. No swelling, effusion, erythema or ecchymosis. Normal range of motion. Tenderness present.     Left knee: No swelling, effusion, erythema or ecchymosis. Normal range of motion. No tenderness.     Right lower leg: No edema.     Left lower leg: No edema.  Lymphadenopathy:     Cervical: No cervical adenopathy.  Skin:    General: Skin is warm and dry.     Coloration: Skin is not pale.     Findings: No bruising, erythema, lesion or rash.  Neurological:     Mental Status: She is alert and oriented to person, place, and time.     Cranial Nerves: No cranial nerve deficit.     Sensory: No sensory deficit.     Motor: No weakness.     Coordination: Coordination normal.     Gait: Gait normal.  Psychiatric:        Mood and Affect: Mood normal.        Speech: Speech normal.        Behavior: Behavior normal.        Thought Content: Thought content normal.        Cognition and Memory: She exhibits impaired recent memory.        Judgment:  Judgment normal.     Labs  reviewed: Recent Labs    01/12/23 1552  NA 138  K 4.7  CL 101  CO2 27  GLUCOSE 93  BUN 27*  CREATININE 1.17*  CALCIUM 10.2   Recent Labs    01/12/23 1552  AST 14  ALT 10  BILITOT 0.6  PROT 7.6   Recent Labs    01/12/23 1552  WBC 9.6  NEUTROABS 5,328  HGB 12.6  HCT 40.5  MCV 80.2  PLT 229   Lab Results  Component Value Date   TSH 2.17 01/12/2023   Lab Results  Component Value Date   HGBA1C 6.4 (H) 05/10/2023   Lab Results  Component Value Date   CHOL 169 01/12/2023   HDL 55 01/12/2023   LDLCALC 97 01/12/2023   TRIG 81 01/12/2023   CHOLHDL 3.1 01/12/2023    Significant Diagnostic Results in last 30 days:  No results found.  Assessment/Plan 1. Type 2 diabetes mellitus with retinopathy, macular edema presence unspecified, unspecified laterality, unspecified retinopathy severity, unspecified whether long term insulin use (HCC) Lab Results  Component Value Date   HGBA1C 6.4 (H) 05/10/2023  - controlled  -Continue with dietary modification - Microalbumin / creatinine urine ratio - Lipid panel; Future - TSH; Future - COMPLETE METABOLIC PANEL WITH GFR; Future - CBC with Differential/Platelet; Future - Hemoglobin A1c; Future  2. Stage 3 chronic kidney disease, unspecified whether stage 3a or 3b CKD (HCC) Creatinine stable -Continue to avoid nephrotoxins - COMPLETE METABOLIC PANEL WITH GFR; Future  3. Vitamin B12 deficiency Continue on vitamin B12 supplement  4. Mixed hyperlipidemia LDL at goal -Continue on Crestor -Continue dietary modification and exercise as tolerated - Lipid panel; Future  5. Essential hypertension Blood pressure well-controlled -Continue on losartan-hydrochlorothiazide and amlodipine -Dietary modification and exercise as above - TSH; Future - COMPLETE METABOLIC PANEL WITH GFR; Future - CBC with Differential/Platelet; Future - Lipid panel; Future Family/ staff Communication: Reviewed plan of care with patient and husband  verbalized understanding  Labs/tests ordered:  - TSH; Future - COMPLETE METABOLIC PANEL WITH GFR; Future - CBC with Differential/Platelet; Future - Hemoglobin A1c; Future - Lipid panel; Future  Next Appointment : Return in about 6 months (around 01/17/2024) for medical mangement of chronic issues., Fasting labs in 6 months prior to visit.   Caesar Bookman, NP

## 2023-07-20 NOTE — Patient Instructions (Addendum)
Please get Tetanus ,Shingles and COVID-19 vaccine at the Pharmacy.

## 2023-07-21 LAB — MICROALBUMIN / CREATININE URINE RATIO
Creatinine, Urine: 150 mg/dL (ref 20–275)
Microalb Creat Ratio: 5 mg/g{creat} (ref <30)
Microalb, Ur: 0.8 mg/dL

## 2023-07-30 ENCOUNTER — Other Ambulatory Visit: Payer: Self-pay | Admitting: Family

## 2023-08-03 ENCOUNTER — Other Ambulatory Visit: Payer: Self-pay | Admitting: Family

## 2023-09-23 DIAGNOSIS — H04123 Dry eye syndrome of bilateral lacrimal glands: Secondary | ICD-10-CM | POA: Diagnosis not present

## 2023-09-23 DIAGNOSIS — H401131 Primary open-angle glaucoma, bilateral, mild stage: Secondary | ICD-10-CM | POA: Diagnosis not present

## 2023-11-24 ENCOUNTER — Other Ambulatory Visit: Payer: Self-pay | Admitting: Family

## 2023-12-27 DIAGNOSIS — H401132 Primary open-angle glaucoma, bilateral, moderate stage: Secondary | ICD-10-CM | POA: Diagnosis not present

## 2024-01-17 ENCOUNTER — Other Ambulatory Visit: Payer: Medicare Other

## 2024-01-17 DIAGNOSIS — I10 Essential (primary) hypertension: Secondary | ICD-10-CM

## 2024-01-17 DIAGNOSIS — E11319 Type 2 diabetes mellitus with unspecified diabetic retinopathy without macular edema: Secondary | ICD-10-CM

## 2024-01-17 DIAGNOSIS — E782 Mixed hyperlipidemia: Secondary | ICD-10-CM | POA: Diagnosis not present

## 2024-01-17 DIAGNOSIS — N183 Chronic kidney disease, stage 3 unspecified: Secondary | ICD-10-CM

## 2024-01-18 LAB — TSH: TSH: 2.92 m[IU]/L (ref 0.40–4.50)

## 2024-01-18 LAB — COMPLETE METABOLIC PANEL WITHOUT GFR
AG Ratio: 1.3 (calc) (ref 1.0–2.5)
ALT: 6 U/L (ref 6–29)
AST: 14 U/L (ref 10–35)
Albumin: 4.3 g/dL (ref 3.6–5.1)
Alkaline phosphatase (APISO): 79 U/L (ref 37–153)
BUN/Creatinine Ratio: 22 (calc) (ref 6–22)
BUN: 24 mg/dL (ref 7–25)
CO2: 28 mmol/L (ref 20–32)
Calcium: 10 mg/dL (ref 8.6–10.4)
Chloride: 100 mmol/L (ref 98–110)
Creat: 1.1 mg/dL — ABNORMAL HIGH (ref 0.60–1.00)
Globulin: 3.4 g/dL (ref 1.9–3.7)
Glucose, Bld: 94 mg/dL (ref 65–99)
Potassium: 4.6 mmol/L (ref 3.5–5.3)
Sodium: 137 mmol/L (ref 135–146)
Total Bilirubin: 0.9 mg/dL (ref 0.2–1.2)
Total Protein: 7.7 g/dL (ref 6.1–8.1)

## 2024-01-18 LAB — CBC WITH DIFFERENTIAL/PLATELET
Absolute Lymphocytes: 2480 {cells}/uL (ref 850–3900)
Absolute Monocytes: 601 {cells}/uL (ref 200–950)
Basophils Absolute: 62 {cells}/uL (ref 0–200)
Basophils Relative: 0.8 %
Eosinophils Absolute: 94 {cells}/uL (ref 15–500)
Eosinophils Relative: 1.2 %
HCT: 42.1 % (ref 35.0–45.0)
Hemoglobin: 13.3 g/dL (ref 11.7–15.5)
MCH: 25.5 pg — ABNORMAL LOW (ref 27.0–33.0)
MCHC: 31.6 g/dL — ABNORMAL LOW (ref 32.0–36.0)
MCV: 80.8 fL (ref 80.0–100.0)
MPV: 12.2 fL (ref 7.5–12.5)
Monocytes Relative: 7.7 %
Neutro Abs: 4563 {cells}/uL (ref 1500–7800)
Neutrophils Relative %: 58.5 %
Platelets: 242 10*3/uL (ref 140–400)
RBC: 5.21 10*6/uL — ABNORMAL HIGH (ref 3.80–5.10)
RDW: 14.8 % (ref 11.0–15.0)
Total Lymphocyte: 31.8 %
WBC: 7.8 10*3/uL (ref 3.8–10.8)

## 2024-01-18 LAB — LIPID PANEL
Cholesterol: 166 mg/dL (ref <200)
HDL: 51 mg/dL (ref >=50)
LDL Cholesterol (Calc): 99 mg/dL
Non-HDL Cholesterol (Calc): 115 mg/dL (ref <130)
Total CHOL/HDL Ratio: 3.3 (calc) (ref <5.0)
Triglycerides: 70 mg/dL (ref <150)

## 2024-01-18 LAB — HEMOGLOBIN A1C
Hgb A1c MFr Bld: 6.3 % — ABNORMAL HIGH (ref <5.7)
Mean Plasma Glucose: 134 mg/dL
eAG (mmol/L): 7.4 mmol/L

## 2024-01-20 ENCOUNTER — Encounter: Payer: Self-pay | Admitting: Family

## 2024-01-20 ENCOUNTER — Ambulatory Visit: Payer: Medicare Other | Admitting: Family

## 2024-01-20 VITALS — BP 122/70 | HR 85 | Temp 97.8°F | Resp 16 | Ht 63.5 in | Wt 165.4 lb

## 2024-01-20 DIAGNOSIS — Z23 Encounter for immunization: Secondary | ICD-10-CM

## 2024-01-20 DIAGNOSIS — I10 Essential (primary) hypertension: Secondary | ICD-10-CM | POA: Diagnosis not present

## 2024-01-20 DIAGNOSIS — E11319 Type 2 diabetes mellitus with unspecified diabetic retinopathy without macular edema: Secondary | ICD-10-CM

## 2024-01-20 DIAGNOSIS — L602 Onychogryphosis: Secondary | ICD-10-CM | POA: Diagnosis not present

## 2024-01-20 DIAGNOSIS — N183 Chronic kidney disease, stage 3 unspecified: Secondary | ICD-10-CM | POA: Diagnosis not present

## 2024-01-20 DIAGNOSIS — F411 Generalized anxiety disorder: Secondary | ICD-10-CM

## 2024-01-20 DIAGNOSIS — E782 Mixed hyperlipidemia: Secondary | ICD-10-CM | POA: Diagnosis not present

## 2024-01-20 DIAGNOSIS — E663 Overweight: Secondary | ICD-10-CM

## 2024-01-20 DIAGNOSIS — Z6828 Body mass index (BMI) 28.0-28.9, adult: Secondary | ICD-10-CM

## 2024-01-20 DIAGNOSIS — H409 Unspecified glaucoma: Secondary | ICD-10-CM | POA: Insufficient documentation

## 2024-01-20 NOTE — Progress Notes (Signed)
 Provider: Christean Courts FNP-C   Juaquina Machnik, Elijio Guadeloupe, NP  Patient Care Team: Lydia Toren, Elijio Guadeloupe, NP as PCP - General (Family Medicine)  Extended Emergency Contact Information Primary Emergency Contact: Dewan,Krystal  [place] Mobile Phone: [phone] Relation: Daughter Secondary Emergency Contact: Lanter,Edward  [place] Home Phone: [phone] Relation: Spouse  Code Status:  Full Code  Goals of care: Advanced Directive information    01/20/2024    1:15 PM  Advanced Directives  Does Patient Have a Medical Advance Directive? No  Would patient like information on creating a medical advance directive? No - Patient declined     Chief Complaint  Patient presents with   Medical Management of Chronic Issues    6 month follow up.     Discussed the use of AI scribe software for clinical note transcription with the patient, who gave verbal consent to proceed.  History of Present Illness   Meghan Stanley is a 78 year old female who presents for a six-month follow-up visit.  She occasionally snores. Has no history of sleep apnea. She is curious about the potential link between sleep apnea and glaucoma, as mentioned by her eye doctor. Her cataracts have been previously addressed.  She has scoliosis and experiences chronic back pain, which limits her ability to engage in regular exercise. She uses a support belt and has consulted doctors regarding her back pain. She is open to water-based exercises despite not swimming currently.  Her weight has decreased by one pound since the last visit, with a current weight of 165 pounds and a BMI of 28.9. She does not engage in much exercise, citing back pain as a limiting factor.  She monitors her blood sugar at home, with readings ranging from 86 to 110 mg/dL, with 696 mg/dL being the highest. No hypoglycemic episodes are reported. Her A1c has decreased from 6.5 to 6.3 over the past year.  She takes amlodipine ,  losartan , hydrochlorothiazide , Crestor , vitamin B12, Namenda , and Zoloft . She has discontinued melatonin due to nightmares and does not currently take vitamin D  or stool softeners.  She reports frequent urination, especially at night, but denies any dysuria or pruritus. She drinks a lot of water and acknowledges that diabetes can contribute to frequent urination.  She has a bunion on her left foot, which is not painful, and notes that her toenails appear dark, possibly due to shoe rubbing. She has not seen a podiatrist before but is considering it, as her mother sees a foot doctor.    Past Medical History:  Diagnosis Date   Abdominal aortic aneurysm without rupture (HCC)    noted on imaging 2018   Anemia    Arthritis    Atherosclerosis of aorta (HCC)    Colon polyps    GERD (gastroesophageal reflux disease)    Glaucoma    Hemorrhoids    Hiatal hernia    HLD (hyperlipidemia)    Hypertension    Osteopenia    Osteopenia    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HCC)    Pure hypercholesterolemia    Scoliosis    Vitamin D  deficiency    Past Surgical History:  Procedure Laterality Date   COLONOSCOPY N/A 09/29/2016   Procedure: COLONOSCOPY;  Surgeon: Tobin Forts, MD;  Location: [facility];  Service: Endoscopy;  Laterality: N/A;   ESOPHAGOGASTRODUODENOSCOPY N/A 09/29/2016   Procedure: ESOPHAGOGASTRODUODENOSCOPY (EGD);  Surgeon: Tobin Forts, MD;  Location: [facility];  Service: Endoscopy;  Laterality: N/A;   NO PAST SURGERIES  No Known Allergies  Allergies as of 01/20/2024   No Known Allergies      Medication List        Accurate as of Jan 20, 2024  2:21 PM. If you have any questions, ask your nurse or doctor.          STOP taking these medications    melatonin 3 MG Tabs tablet Stopped by: Arshia Spellman C Annalyce Lanpher   Vitamin D -3 125 MCG (5000 UT) Tabs Stopped by: Jamonta Goerner C Toriano Aikey       TAKE these medications    acetaminophen  500 MG tablet Commonly known as:  TYLENOL  Take 1,000 mg by mouth every 6 (six) hours as needed (pain).   amLODipine  10 MG tablet Commonly known as: NORVASC  TAKE 1 TABLET (10 MG TOTAL) BY MOUTH EVERY MORNING.   BIOFREEZE EX Apply 1 application topically daily as needed (arthritis/knee pain).   cyanocobalamin  1000 MCG/ML injection Commonly known as: VITAMIN B12 Inject 1,000 mcg into the muscle every 30 (thirty) days.   docusate sodium  100 MG capsule Commonly known as: COLACE Take 100 mg by mouth daily as needed for mild constipation.   dorzolamide 2 % ophthalmic solution Commonly known as: TRUSOPT Place 1 drop into both eyes every 12 (twelve) hours.   losartan -hydrochlorothiazide  50-12.5 MG tablet Commonly known as: HYZAAR TAKE 1 TABLET BY MOUTH EVERYDAY AT BEDTIME   memantine  5 MG tablet Commonly known as: NAMENDA  TAKE 1 TABLET (5 MG TOTAL) BY MOUTH DAILY.   Rocklatan 0.02-0.005 % Soln Generic drug: Netarsudil-Latanoprost  Place 1 drop into both eyes at bedtime.   rosuvastatin  10 MG tablet Commonly known as: CRESTOR  TAKE 1 TABLET BY MOUTH EVERY DAY   sertraline  25 MG tablet Commonly known as: ZOLOFT  TAKE 1 TABLET (25 MG TOTAL) BY MOUTH DAILY.        Review of Systems  Constitutional:  Negative for appetite change, chills, fatigue, fever and unexpected weight change.  HENT:  Positive for dental problem. Negative for congestion, ear discharge, ear pain, facial swelling, hearing loss, nosebleeds, postnasal drip, rhinorrhea, sinus pressure, sinus pain, sneezing, sore throat, tinnitus and trouble swallowing.        Missing upper teeth   Eyes:  Positive for visual disturbance. Negative for pain, discharge, redness and itching.       Wears corrective lens   Respiratory:  Negative for cough, chest tightness, shortness of breath and wheezing.   Cardiovascular:  Negative for chest pain, palpitations and leg swelling.  Gastrointestinal:  Negative for abdominal distention, abdominal pain, blood in stool,  constipation, diarrhea, nausea and vomiting.  Endocrine: Negative for cold intolerance, heat intolerance, polydipsia, polyphagia and polyuria.  Genitourinary:  Negative for difficulty urinating, dysuria, flank pain, frequency and urgency.  Musculoskeletal:  Positive for arthralgias, back pain and gait problem. Negative for joint swelling, myalgias, neck pain and neck stiffness.  Skin:  Negative for color change, pallor, rash and wound.  Neurological:  Negative for dizziness, syncope, speech difficulty, weakness, light-headedness, numbness and headaches.  Hematological:  Does not bruise/bleed easily.  Psychiatric/Behavioral:  Negative for agitation, behavioral problems, confusion, hallucinations, self-injury, sleep disturbance and suicidal ideas. The patient is not nervous/anxious.     Immunization History  Administered Date(s) Administered   Influenza, Quadrivalent, Recombinant, Inj, Pf 10/09/2018   Influenza,inj,quad, With Preservative 10/02/2015   PFIZER(Purple Top)SARS-COV-2 Vaccination 11/26/2019, 12/17/2019, 09/03/2020   PNEUMOCOCCAL CONJUGATE-20 07/14/2022   Pneumococcal Conjugate-13 10/02/2015   Tdap 01/20/2024   Pertinent  Health Maintenance Due  Topic Date Due   DEXA  SCAN  Completed   INFLUENZA VACCINE  Discontinued   Colonoscopy  Discontinued      04/12/2022    2:14 PM 01/12/2023    2:57 PM 03/17/2023    3:09 PM 07/20/2023    3:22 PM 01/20/2024    1:17 PM  Fall Risk  Falls in the past year? 0 0 0 0 0  Was there an injury with Fall? 0 0 0 0 0  Fall Risk Category Calculator 0 0 0 0 0  Fall Risk Category (Retired) Low      (RETIRED) Patient Fall Risk Level Low fall risk      Patient at Risk for Falls Due to No Fall Risks No Fall Risks No Fall Risks No Fall Risks No Fall Risks  Fall risk Follow up Falls evaluation completed Falls evaluation completed Falls evaluation completed Falls evaluation completed Falls evaluation completed   Functional Status Survey:    Vitals:    01/20/24 1326  BP: 122/70  Pulse: 85  Resp: 16  Temp: 97.8 F (36.6 C)  SpO2: 95%  Weight: 165 lb 6.4 oz (75 kg)  Height: 5' 3.5" (1.613 m)   Body mass index is 28.84 kg/m. Physical Exam  VITALS: T- 97.8, P- 85, BP- 122/70, SaO2- 95% MEASUREMENTS: Weight- 165, BMI- 28.0. GENERAL: Alert, cooperative, well developed, no acute distress. HEENT: Normocephalic, normal oropharynx, moist mucous membranes, ears normal without infection, nose normal, no sinus tenderness. NECK: Thyroid  normal, no cervical adenopathy. CHEST: Clear to auscultation bilaterally, no wheezes, rhonchi, or crackles. CARDIOVASCULAR: Normal heart rate and rhythm, S1 and S2 normal without murmurs. ABDOMEN: Soft, non-tender, non-distended, without organomegaly, normal bowel sounds. EXTREMITIES: No cyanosis or edema, no swelling. MUSCULOSKELETAL: Right knee pain, no redness bilateral knee crepitus.Scoliosis NEUROLOGICAL: Cranial nerves grossly intact, moves all extremities without gross motor or sensory deficit, sensation intact in feet.  SKIN: No rash or erythema   PSYCHIATRY/BEHAVIORAL: Memory loss.Mood stable   Labs reviewed: Recent Labs    01/17/24 0908  NA 137  K 4.6  CL 100  CO2 28  GLUCOSE 94  BUN 24  CREATININE 1.10*  CALCIUM  10.0   Recent Labs    01/17/24 0908  AST 14  ALT 6  BILITOT 0.9  PROT 7.7   Recent Labs    01/17/24 0908  WBC 7.8  NEUTROABS 4,563  HGB 13.3  HCT 42.1  MCV 80.8  PLT 242   Lab Results  Component Value Date   TSH 2.92 01/17/2024   Lab Results  Component Value Date   HGBA1C 6.3 (H) 01/17/2024   Lab Results  Component Value Date   CHOL 166 01/17/2024   HDL 51 01/17/2024   LDLCALC 99 01/17/2024   TRIG 70 01/17/2024   CHOLHDL 3.3 01/17/2024    Significant Diagnostic Results in last 30 days:  No results found.  Assessment/Plan    Type 2 Diabetes Mellitus without complications Type 2 diabetes is well controlled with current management. Blood sugar  levels at home range from 86 to 110 mg/dL, with no hypoglycemic episodes reported. Recent A1c has decreased from 6.5% to 6.3%, indicating good glycemic control. Emphasis on maintaining a balanced diet and regular exercise to prevent complications. Discussed the importance of monitoring carbohydrate intake and the benefits of regular exercise in maintaining blood sugar levels. - Repeat lab work in 6 months to monitor blood sugar and cholesterol levels. - Continue current diabetes management and lifestyle modifications.  Hypertension Blood pressure is well controlled at 122/70 mmHg with current medication  regimen.  Hyperlipidemia Cholesterol levels are within target range. Total cholesterol has decreased from 169 to 166 mg/dL. HDL is slightly decreased from 55 to 51 mg/dL, but overall lipid profile is stable. Encouraged to increase physical activity to improve HDL levels.  Overweight BMI is 28.84, indicating overweight status. Weight has decreased by 1 pound since last visit. Encouraged to engage in regular physical activity, particularly water-based exercises, to aid in weight management and improve overall health. Discussed the benefits of water exercises for joint health and weight management. - Encourage participation in water-based exercises at local facilities like the [facility].  Scoliosis Chronic back pain associated with scoliosis. Exercise and posture support are recommended for management. Discussed the use of water exercises to alleviate joint stress and improve mobility.  Anxiety Currently managed with sertraline  (Zoloft ) 25 mg. - Continue sertraline  (Zoloft ) 25 mg for anxiety management.  Dementia Memory has improved with current management. Engaging in activities that stimulate cognitive function is encouraged. Discussed the benefits of cognitive exercises such as puzzles and reading for memory support. - Encourage activities such as reading, puzzles, and other cognitive exercises to  support memory function.   Family/ staff Communication: Reviewed plan of care with patient verbalized understanding   Labs/tests ordered:  - CBC with Differential/Platelet - CMP with eGFR([facility]) - TSH - Hgb A1C - Lipid panel   Next Appointment : Return in about 6 months (around 07/22/2024) for medical mangement of chronic issues., Fasting labs in 6 months prior to visit.   Spent 35 minutes of Face to face and non-face to face with patient  >50% time spent counseling; reviewing medical record; tests; labs; documentation and developing future plan of care.   Estil Heman, NP

## 2024-02-01 ENCOUNTER — Other Ambulatory Visit: Payer: Self-pay | Admitting: Family

## 2024-02-06 DIAGNOSIS — Z961 Presence of intraocular lens: Secondary | ICD-10-CM | POA: Diagnosis not present

## 2024-02-06 DIAGNOSIS — H401132 Primary open-angle glaucoma, bilateral, moderate stage: Secondary | ICD-10-CM | POA: Diagnosis not present

## 2024-02-10 ENCOUNTER — Ambulatory Visit: Admitting: Podiatry

## 2024-02-10 DIAGNOSIS — B351 Tinea unguium: Secondary | ICD-10-CM

## 2024-02-10 DIAGNOSIS — M79675 Pain in left toe(s): Secondary | ICD-10-CM

## 2024-02-10 DIAGNOSIS — M79674 Pain in right toe(s): Secondary | ICD-10-CM | POA: Diagnosis not present

## 2024-02-10 NOTE — Progress Notes (Signed)
  Subjective:  Patient ID: Meghan Stanley, female    DOB: [birth year],  MRN: [id number]  Chief Complaint  Patient presents with   Nail Problem    Nail trim    78 y.o. female returns for the above complaint.  Patient presents with thickened elongated dystrophic mycotic nail x 10 mild pain on palpation hurts with ambulation is with pressure.  She would like to have it debrided down she has not seen anyone as part of same he denies any other acute issues.  Objective:  There were no vitals filed for this visit. Podiatric Exam: Vascular: dorsalis pedis and posterior tibial pulses are palpable bilateral. Capillary return is immediate. Temperature gradient is WNL. Skin turgor WNL  Sensorium: Normal Semmes Weinstein monofilament test. Normal tactile sensation bilaterally. Nail Exam: Pt has thick disfigured discolored nails with subungual debris noted bilateral entire nail hallux through fifth toenails.  Pain on palpation to the nails. Ulcer Exam: There is no evidence of ulcer or pre-ulcerative changes or infection. Orthopedic Exam: Muscle tone and strength are WNL. No limitations in general ROM. No crepitus or effusions noted.  Skin: No Porokeratosis. No infection or ulcers    Assessment & Plan:  No diagnosis found.  Patient was evaluated and treated and all questions answered.  Onychomycosis with pain  -Nails palliatively debrided as below. -Educated on self-care  Procedure: Nail Debridement Rationale: pain  Type of Debridement: manual, sharp debridement. Instrumentation: Nail nipper, rotary burr. Number of Nails: 10  Procedures and Treatment: Consent by patient was obtained for treatment procedures. The patient understood the discussion of treatment and procedures well. All questions were answered thoroughly reviewed. Debridement of mycotic and hypertrophic toenails, 1 through 5 bilateral and clearing of subungual debris. No ulceration, no infection noted.  Return Visit-Office Procedure:  Patient instructed to return to the office for a follow up visit 3 months for continued evaluation and treatment.  Tinnie Forehand, DPM    No follow-ups on file.

## 2024-03-16 NOTE — Progress Notes (Unsigned)
 Subjective:   Meghan Stanley is a 78 y.o. female who presents for Medicare Annual (Subsequent) preventive examination.  Visit Complete: {VISITMETHODVS:[phone]}  Patient Medicare AWV questionnaire was completed by the patient on ***; I have confirmed that all information answered by patient is correct and no changes since this date.        Objective:    There were no vitals filed for this visit. There is no height or weight on file to calculate BMI.     01/20/2024    1:15 PM 01/12/2023    2:57 PM 04/12/2022    2:14 PM 10/06/2021    4:31 PM 06/17/2021    3:28 PM 04/06/2021    4:53 PM 03/15/2021    3:33 PM  Advanced Directives  Does Patient Have a Medical Advance Directive? No Yes No No No No No  Type of Advance Directive  Healthcare Power of Attorney       Does patient want to make changes to medical advance directive?  No - Patient declined       Copy of Healthcare Power of Attorney in Chart?  No - copy requested       Would patient like information on creating a medical advance directive? No - Patient declined  No - Patient declined No - Patient declined No - Patient declined No - Patient declined No - Patient declined    Current Medications (verified) Outpatient Encounter Medications as of 03/19/2024  Medication Sig   acetaminophen  (TYLENOL ) 500 MG tablet Take 1,000 mg by mouth every 6 (six) hours as needed (pain).   amLODipine  (NORVASC ) 10 MG tablet TAKE 1 TABLET (10 MG TOTAL) BY MOUTH EVERY MORNING.   cyanocobalamin  (,VITAMIN B-12,) 1000 MCG/ML injection Inject 1,000 mcg into the muscle every 30 (thirty) days.   docusate sodium  (COLACE) 100 MG capsule Take 100 mg by mouth daily as needed for mild constipation. (Patient not taking: Reported on 01/20/2024)   dorzolamide (TRUSOPT) 2 % ophthalmic solution Place 1 drop into both eyes every 12 (twelve) hours.   losartan -hydrochlorothiazide  (HYZAAR) 50-12.5 MG tablet TAKE 1 TABLET BY MOUTH EVERYDAY AT BEDTIME   memantine  (NAMENDA ) 5  MG tablet TAKE 1 TABLET (5 MG TOTAL) BY MOUTH DAILY.   Menthol, Topical Analgesic, (BIOFREEZE EX) Apply 1 application topically daily as needed (arthritis/knee pain).   ROCKLATAN 0.02-0.005 % SOLN Place 1 drop into both eyes at bedtime.   rosuvastatin  (CRESTOR ) 10 MG tablet TAKE 1 TABLET BY MOUTH EVERY DAY   sertraline  (ZOLOFT ) 25 MG tablet TAKE 1 TABLET (25 MG TOTAL) BY MOUTH DAILY.   No facility-administered encounter medications on file as of 03/19/2024.    Allergies (verified) Patient has no known allergies.   History: Past Medical History:  Diagnosis Date   Abdominal aortic aneurysm without rupture (HCC)    noted on imaging 2018   Anemia    Arthritis    Atherosclerosis of aorta (HCC)    Colon polyps    GERD (gastroesophageal reflux disease)    Glaucoma    Hemorrhoids    Hiatal hernia    HLD (hyperlipidemia)    Hypertension    Osteopenia    Osteopenia    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HCC)    Pure hypercholesterolemia    Scoliosis    Vitamin D  deficiency    Past Surgical History:  Procedure Laterality Date   COLONOSCOPY N/A 09/29/2016   Procedure: COLONOSCOPY;  Surgeon: Norleen LOISE Kiang, MD;  Location: [facility];  Service: Endoscopy;  Laterality: N/A;  ESOPHAGOGASTRODUODENOSCOPY N/A 09/29/2016   Procedure: ESOPHAGOGASTRODUODENOSCOPY (EGD);  Surgeon: Norleen LOISE Kiang, MD;  Location: [facility];  Service: Endoscopy;  Laterality: N/A;   NO PAST SURGERIES     Family History  Problem Relation Age of Onset   Hypertension Mother    Heart disease Father    Glaucoma Son    Social History   Socioeconomic History   Marital status: Married    Spouse name: Not on file   Number of children: Not on file   Years of education: Not on file   Highest education level: Not on file  Occupational History   Not on file  Tobacco Use   Smoking status: Never   Smokeless tobacco: Never  Vaping Use   Vaping status: Never Used  Substance and Sexual Activity   Alcohol use: No    Drug use: No   Sexual activity: Not on file  Other Topics Concern   Not on file  Social History Narrative   Tobacco use, amount per day now: None.   Past tobacco use, amount per day: None.   How many years did you use tobacco: None.   Alcohol use (drinks per week): None.   Diet: N/A   Do you drink/eat things with caffeine: Yes   Marital status:  Married                                What year were you married?   Do you live in a house, apartment, assisted living, condo, trailer, etc.? House   Is it one or more stories? One   How many persons live in your home? 3   Do you have pets in your home?( please list) No   Highest Level of education completed? High School   Current or past profession:   Do you exercise?   A little bit                               Type and how often? Walking   Do you have a living will?   Do you have a DNR form?                                   If not, do you want to discuss one?   Do you have signed POA/HPOA forms?                        If so, please bring to you appointment      Do you have any difficulty bathing or dressing yourself? No   Do you have any difficulty preparing food or eating? No   Do you have any difficulty managing your medications? No   Do you have any difficulty managing your finances? No   Do you have any difficulty affording your medications? No   Social Drivers of Corporate investment banker Strain: Not on file  Food Insecurity: Not on file  Transportation Needs: Not on file  Physical Activity: Not on file  Stress: Not on file  Social Connections: Not on file    Tobacco Counseling Counseling given: Not Answered   Clinical Intake:  Activities of Daily Living     No data to display          Patient Care Team: Ngetich, Roxan BROCKS, NP as PCP - General (Family Medicine)  Indicate any recent Medical Services you may have received from other than [facility] providers in the past year (date  may be approximate).     Assessment:   This is a routine wellness examination for [place].  Hearing/Vision screen No results found.   Goals Addressed   None    Depression Screen    01/20/2024    1:17 PM 07/27/2023   11:20 AM 07/20/2023    3:27 PM 03/17/2023    3:09 PM 01/12/2023    2:57 PM 03/03/2021   12:49 PM 03/01/2021    9:37 PM  PHQ 2/9 Scores  PHQ - 2 Score 0 0 0 0 0  1  PHQ- 9 Score 2  0    4  Exception Documentation         Not completed            Information is confidential and restricted. Go to Review Flowsheets to unlock data.    Fall Risk    01/20/2024    1:17 PM 07/20/2023    3:22 PM 03/17/2023    3:09 PM 01/12/2023    2:57 PM 04/12/2022    2:14 PM  Fall Risk   Falls in the past year? 0 0 0 0 0  Number falls in past yr: 0 0 0 0 0  Injury with Fall? 0 0 0 0 0  Risk for fall due to : No Fall Risks No Fall Risks No Fall Risks No Fall Risks No Fall Risks  Follow up Falls evaluation completed Falls evaluation completed Falls evaluation completed Falls evaluation completed Falls evaluation completed      Data saved with a previous flowsheet row definition    MEDICARE RISK AT HOME:    TIMED UP AND GO:  Was the test performed?  {AMBTIMEDUPGO:[id number]}    Cognitive Function:    03/17/2023    3:23 PM  MMSE - Mini Mental State Exam  Orientation to time 5  Orientation to Place 5  Registration 3  Attention/ Calculation 5  Recall 2  Language- name 2 objects 2  Language- repeat 1  Language- follow 3 step command 3  Language- read & follow direction 1  Write a sentence 1  Copy design 1  Total score 29        Immunizations Immunization History  Administered Date(s) Administered   Influenza, Quadrivalent, Recombinant, Inj, Pf 10/09/2018   Influenza,inj,quad, With Preservative 10/02/2015   PFIZER(Purple Top)SARS-COV-2 Vaccination 11/26/2019, 12/17/2019, 09/03/2020   PNEUMOCOCCAL CONJUGATE-20 07/14/2022   Pneumococcal Conjugate-13 10/02/2015   Tdap  01/20/2024    {TDAP status:2101805}  {Flu Vaccine status:2101806}  {Pneumococcal vaccine status:2101807}  {Covid-19 vaccine status:2101808}  Qualifies for Shingles Vaccine? {YES/NO:21197}  Zostavax completed {YES/NO:21197}  {Shingrix Completed?:2101804}  Screening Tests Health Maintenance  Topic Date Due   OPHTHALMOLOGY EXAM  Never done   COVID-19 Vaccine (4 - 2024-25 season) 05/08/2023   Medicare Annual Wellness (AWV)  03/16/2024   Zoster Vaccines- Shingrix (1 of 2) 07/23/2024 (Originally 02/05/1996)   Diabetic kidney evaluation - Urine ACR  07/19/2024   HEMOGLOBIN A1C  07/19/2024   Diabetic kidney evaluation - eGFR measurement  01/16/2025   FOOT EXAM  01/19/2025   DTaP/Tdap/Td (2 - Td or Tdap) 01/19/2034   Pneumococcal Vaccine: 50+ Years  Completed   DEXA SCAN  Completed   Hepatitis C Screening  Completed   Hepatitis B Vaccines  Aged Out   HPV VACCINES  Aged Out   Meningococcal B Vaccine  Aged Out   INFLUENZA VACCINE  Discontinued   Colonoscopy  Discontinued    Health Maintenance  Health Maintenance Due  Topic Date Due   OPHTHALMOLOGY EXAM  Never done   COVID-19 Vaccine (4 - 2024-25 season) 05/08/2023   Medicare Annual Wellness (AWV)  03/16/2024    {Colorectal cancer screening:2101809}  {Mammogram status:21018020}  {Bone Density status:21018021}  Lung Cancer Screening: (Low Dose CT Chest recommended if Age 31-80 years, 20 pack-year currently smoking OR have quit w/in 15years.) {DOES NOT does:27190::does not} qualify.   Lung Cancer Screening Referral: ***  Additional Screening:  Hepatitis C Screening: {DOES NOT does:27190::does not} qualify; Completed ***  Vision Screening: Recommended annual ophthalmology exams for early detection of glaucoma and other disorders of the eye. Is the patient up to date with their annual eye exam?  {YES/NO:21197} Who is the provider or what is the name of the office in which the patient attends annual eye exams? *** If  pt is not established with a provider, would they like to be referred to a provider to establish care? {YES/NO:21197}.   Dental Screening: Recommended annual dental exams for proper oral hygiene  Diabetic Foot Exam: {Diabetic Foot Exam:2101802}  Community Resource Referral / Chronic Care Management: CRR required this visit?  {YES/NO:21197}  CCM required this visit?  {CCM Required [phone]}     Plan:     I have personally reviewed and noted the following in the patient's chart:   Medical and social history Use of alcohol, tobacco or illicit drugs  Current medications and supplements including opioid prescriptions. {Opioid Prescriptions:[phone]} Functional ability and status Nutritional status Physical activity Advanced directives List of other physicians Hospitalizations, surgeries, and ER visits in previous 12 months Vitals Screenings to include cognitive, depression, and falls Referrals and appointments  In addition, I have reviewed and discussed with patient certain preventive protocols, quality metrics, and best practice recommendations. A written personalized care plan for preventive services as well as general preventive health recommendations were provided to patient.     Desha Bitner X Annaston Upham, NP   03/16/2024   After Visit Summary: {CHL AMB AWV After Visit [phone]}  Nurse Notes: ***

## 2024-03-19 ENCOUNTER — Encounter: Payer: Self-pay | Admitting: Nurse Practitioner

## 2024-03-19 ENCOUNTER — Ambulatory Visit: Payer: Medicare Other | Admitting: Nurse Practitioner

## 2024-03-19 VITALS — BP 124/72 | HR 90 | Temp 97.7°F | Resp 18 | Ht 63.5 in | Wt 162.8 lb

## 2024-03-19 DIAGNOSIS — Z0000 Encounter for general adult medical examination without abnormal findings: Secondary | ICD-10-CM | POA: Diagnosis not present

## 2024-05-08 ENCOUNTER — Other Ambulatory Visit: Payer: Self-pay | Admitting: Family

## 2024-05-08 DIAGNOSIS — H04123 Dry eye syndrome of bilateral lacrimal glands: Secondary | ICD-10-CM | POA: Diagnosis not present

## 2024-05-08 DIAGNOSIS — H401132 Primary open-angle glaucoma, bilateral, moderate stage: Secondary | ICD-10-CM | POA: Diagnosis not present

## 2024-05-27 ENCOUNTER — Other Ambulatory Visit: Payer: Self-pay | Admitting: Family

## 2024-06-12 ENCOUNTER — Other Ambulatory Visit: Payer: Self-pay | Admitting: Family

## 2024-07-02 NOTE — Progress Notes (Signed)
 Meghan Stanley                                          MRN: [id number]   07/02/2024   The [facility] Quality Team Specialist reviewed this patient medical record for the purposes of chart review for care gap closure. The following were reviewed: chart review for care gap closure-kidney health evaluation for diabetes:eGFR  and uACR.    [facility]BCI Quality Team

## 2024-07-20 ENCOUNTER — Other Ambulatory Visit: Payer: Self-pay

## 2024-07-20 DIAGNOSIS — E11319 Type 2 diabetes mellitus with unspecified diabetic retinopathy without macular edema: Secondary | ICD-10-CM

## 2024-07-20 DIAGNOSIS — I10 Essential (primary) hypertension: Secondary | ICD-10-CM

## 2024-07-20 DIAGNOSIS — N183 Chronic kidney disease, stage 3 unspecified: Secondary | ICD-10-CM

## 2024-07-20 DIAGNOSIS — E782 Mixed hyperlipidemia: Secondary | ICD-10-CM

## 2024-07-21 LAB — LIPID PANEL
Cholesterol: 146 mg/dL (ref <200)
HDL: 49 mg/dL — ABNORMAL LOW (ref >=50)
LDL Cholesterol (Calc): 83 mg/dL
Non-HDL Cholesterol (Calc): 97 mg/dL (ref <130)
Total CHOL/HDL Ratio: 3 (calc) (ref <5.0)
Triglycerides: 64 mg/dL (ref <150)

## 2024-07-21 LAB — COMPLETE METABOLIC PANEL WITHOUT GFR
AG Ratio: 1.3 (calc) (ref 1.0–2.5)
ALT: 7 U/L (ref 6–29)
AST: 14 U/L (ref 10–35)
Albumin: 4.3 g/dL (ref 3.6–5.1)
Alkaline phosphatase (APISO): 76 U/L (ref 37–153)
BUN: 19 mg/dL (ref 7–25)
CO2: 30 mmol/L (ref 20–32)
Calcium: 10.6 mg/dL — ABNORMAL HIGH (ref 8.6–10.4)
Chloride: 96 mmol/L — ABNORMAL LOW (ref 98–110)
Creat: 0.96 mg/dL (ref 0.60–1.00)
Globulin: 3.3 g/dL (ref 1.9–3.7)
Glucose, Bld: 95 mg/dL (ref 65–99)
Potassium: 4.3 mmol/L (ref 3.5–5.3)
Sodium: 138 mmol/L (ref 135–146)
Total Bilirubin: 0.8 mg/dL (ref 0.2–1.2)
Total Protein: 7.6 g/dL (ref 6.1–8.1)

## 2024-07-21 LAB — CBC WITH DIFFERENTIAL/PLATELET
Absolute Lymphocytes: 2712 {cells}/uL (ref 850–3900)
Absolute Monocytes: 520 {cells}/uL (ref 200–950)
Basophils Absolute: 40 {cells}/uL (ref 0–200)
Basophils Relative: 0.5 %
Eosinophils Absolute: 80 {cells}/uL (ref 15–500)
Eosinophils Relative: 1 %
HCT: 42 % (ref 35.0–45.0)
Hemoglobin: 13.4 g/dL (ref 11.7–15.5)
MCH: 25.7 pg — ABNORMAL LOW (ref 27.0–33.0)
MCHC: 31.9 g/dL — ABNORMAL LOW (ref 32.0–36.0)
MCV: 80.6 fL (ref 80.0–100.0)
MPV: 12.3 fL (ref 7.5–12.5)
Monocytes Relative: 6.5 %
Neutro Abs: 4648 {cells}/uL (ref 1500–7800)
Neutrophils Relative %: 58.1 %
Platelets: 240 Thousand/uL (ref 140–400)
RBC: 5.21 Million/uL — ABNORMAL HIGH (ref 3.80–5.10)
RDW: 14.8 % (ref 11.0–15.0)
Total Lymphocyte: 33.9 %
WBC: 8 Thousand/uL (ref 3.8–10.8)

## 2024-07-21 LAB — TSH: TSH: 2.15 m[IU]/L (ref 0.40–4.50)

## 2024-07-21 LAB — HEMOGLOBIN A1C
Hgb A1c MFr Bld: 6.1 % — ABNORMAL HIGH (ref <5.7)
Mean Plasma Glucose: 128 mg/dL
eAG (mmol/L): 7.1 mmol/L

## 2024-07-23 ENCOUNTER — Ambulatory Visit: Admitting: Family

## 2024-07-24 ENCOUNTER — Ambulatory Visit (INDEPENDENT_AMBULATORY_CARE_PROVIDER_SITE_OTHER): Payer: Self-pay | Admitting: Family

## 2024-07-24 ENCOUNTER — Encounter: Payer: Self-pay | Admitting: Family

## 2024-07-24 VITALS — BP 102/70 | HR 82 | Temp 97.9°F | Resp 17 | Ht 63.5 in | Wt 158.6 lb

## 2024-07-24 DIAGNOSIS — I10 Essential (primary) hypertension: Secondary | ICD-10-CM | POA: Diagnosis not present

## 2024-07-24 DIAGNOSIS — N183 Chronic kidney disease, stage 3 unspecified: Secondary | ICD-10-CM

## 2024-07-24 DIAGNOSIS — F22 Delusional disorders: Secondary | ICD-10-CM | POA: Insufficient documentation

## 2024-07-24 DIAGNOSIS — E782 Mixed hyperlipidemia: Secondary | ICD-10-CM | POA: Diagnosis not present

## 2024-07-24 DIAGNOSIS — F411 Generalized anxiety disorder: Secondary | ICD-10-CM

## 2024-07-24 DIAGNOSIS — F321 Major depressive disorder, single episode, moderate: Secondary | ICD-10-CM | POA: Insufficient documentation

## 2024-07-24 DIAGNOSIS — E11319 Type 2 diabetes mellitus with unspecified diabetic retinopathy without macular edema: Secondary | ICD-10-CM

## 2024-07-24 DIAGNOSIS — Z8659 Personal history of other mental and behavioral disorders: Secondary | ICD-10-CM | POA: Insufficient documentation

## 2024-07-24 NOTE — Patient Instructions (Signed)
 1.Report to local pharmacy to receive Covid & Shingles Vaccines.

## 2024-07-24 NOTE — Progress Notes (Signed)
 Provider: Roxan Plough FNP-C   Shamarr Faucett, Roxan BROCKS, NP  Patient Care Team: Laderius Valbuena, Roxan BROCKS, NP as PCP - General (Family Medicine)  Extended Emergency Contact Information Primary Emergency Contact: Radde,Krystal  [place] Mobile Phone: [phone] Relation: Daughter Secondary Emergency Contact: Kugelman,Edward  [place] Home Phone: [phone] Relation: Spouse  Code Status:  Full Code  Goals of care: Advanced Directive information    07/24/2024    1:43 PM  Advanced Directives  Does Patient Have a Medical Advance Directive? No  Would patient like information on creating a medical advance directive? No - Patient declined     Chief Complaint  Patient presents with   Medical Management of Chronic Issues    6 Month Follow Up.     Discussed the use of AI scribe software for clinical note transcription with the patient, who gave verbal consent to proceed.  History of Present Illness   Meghan Stanley is a 78 year old female who presents for a six-month follow-up.  She has type 2 diabetes with retinopathy. Her recent glucose level was 95 mg/dL, slightly up from 94 mg/dL six months ago, and her A1c has improved from 6.3% to 6.1%. She manages her diabetes with lifestyle modifications and medications.  She has a history of mild dementia and is on Namenda  5 mg daily. She scored 29 out of 30 on a mini mental state exam conducted on July 14th, which is considered normal. She occasionally forgets things but they come back to her. She engages in activities like puzzles to help with memory.  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  Her cholesterol levels are well-managed with rosuvastatin  10 mg daily. Her total cholesterol is 146 mg/dL, HDL is 49 mg/dL, triglycerides are 64 mg/dL, and LDL is 83 mg/dL.  Her recent BUN levels were within normal limits. However, her calcium   level is slightly elevated at 10.6 mg/dL, and she is not taking any calcium  supplements.  She is on amlodipine  10 mg and losartan  with hydrochlorothiazide  (50 mg/12.5 mg) for hypertension, with no reported leg swelling.  She experiences arthritis-related pain but no significant swelling or pain in her knees or back. She uses Biofreeze topically as needed.  She reports issues with her vision, feeling that her glasses are not effective. She has glaucoma and uses Refresh eye drops, but they do not seem to help.  She has a history of bunions and toenail issues, which she manages with appropriate footwear and regular visits to a foot doctor. She experiences dry skin and scratches her legs, particularly the right one.   Past Medical History:  Diagnosis Date   Abdominal aortic aneurysm without rupture    noted on imaging 2018   Anemia    Arthritis    Atherosclerosis of aorta    Colon polyps    GERD (gastroesophageal reflux disease)    Glaucoma    Hemorrhoids    Hiatal hernia    HLD (hyperlipidemia)    Hypertension    Osteopenia    Osteopenia    Other nonthrombocytopenic purpura    Pure hypercholesterolemia    Scoliosis    Vitamin D  deficiency    Past Surgical History:  Procedure Laterality Date   COLONOSCOPY N/A 09/29/2016   Procedure: COLONOSCOPY;  Surgeon: Norleen LOISE Kiang, MD;  Location: [facility];  Service: Endoscopy;  Laterality: N/A;   ESOPHAGOGASTRODUODENOSCOPY N/A 09/29/2016   Procedure:  ESOPHAGOGASTRODUODENOSCOPY (EGD);  Surgeon: Norleen LOISE Kiang, MD;  Location: [facility];  Service: Endoscopy;  Laterality: N/A;   NO PAST SURGERIES      No Known Allergies  Allergies as of 07/24/2024   No Known Allergies      Medication List        Accurate as of July 24, 2024  2:37 PM. If you have any questions, ask your nurse or doctor.          acetaminophen  500 MG tablet Commonly known as: TYLENOL  Take 1,000 mg by mouth every 6 (six) hours as needed (pain).    amLODipine  10 MG tablet Commonly known as: NORVASC  TAKE 1 TABLET (10 MG TOTAL) BY MOUTH EVERY MORNING.   BIOFREEZE EX Apply 1 application topically daily as needed (arthritis/knee pain).   dorzolamide 2 % ophthalmic solution Commonly known as: TRUSOPT Place 1 drop into both eyes every 12 (twelve) hours.   losartan -hydrochlorothiazide  50-12.5 MG tablet Commonly known as: HYZAAR TAKE 1 TABLET BY MOUTH EVERYDAY AT BEDTIME   memantine  5 MG tablet Commonly known as: NAMENDA  TAKE 1 TABLET (5 MG TOTAL) BY MOUTH DAILY.   Rocklatan 0.02-0.005 % Soln Generic drug: Netarsudil-Latanoprost  Place 1 drop into both eyes at bedtime.   rosuvastatin  10 MG tablet Commonly known as: CRESTOR  TAKE 1 TABLET BY MOUTH EVERY DAY   sertraline  25 MG tablet Commonly known as: ZOLOFT  TAKE 1 TABLET (25 MG TOTAL) BY MOUTH DAILY.        Review of Systems  Constitutional:  Negative for appetite change, chills, fatigue, fever and unexpected weight change.  HENT:  Negative for congestion, dental problem, ear discharge, ear pain, facial swelling, hearing loss, nosebleeds, postnasal drip, rhinorrhea, sinus pressure, sinus pain, sneezing, sore throat, tinnitus and trouble swallowing.   Eyes:  Negative for pain, discharge, redness, itching and visual disturbance.  Respiratory:  Negative for cough, chest tightness, shortness of breath and wheezing.   Cardiovascular:  Negative for chest pain, palpitations and leg swelling.  Gastrointestinal:  Negative for abdominal distention, abdominal pain, blood in stool, constipation, diarrhea, nausea and vomiting.  Endocrine: Negative for cold intolerance, heat intolerance, polydipsia, polyphagia and polyuria.  Genitourinary:  Negative for difficulty urinating, dysuria, flank pain, frequency and urgency.  Musculoskeletal:  Positive for arthralgias, back pain and gait problem. Negative for joint swelling, myalgias, neck pain and neck stiffness.  Skin:  Negative for color  change, pallor, rash and wound.  Neurological:  Negative for dizziness, syncope, speech difficulty, weakness, light-headedness, numbness and headaches.  Hematological:  Does not bruise/bleed easily.  Psychiatric/Behavioral:  Negative for agitation, behavioral problems, confusion, hallucinations, self-injury, sleep disturbance and suicidal ideas. The patient is not nervous/anxious.        Forgetful     Immunization History  Administered Date(s) Administered   Influenza, Quadrivalent, Recombinant, Inj, Pf 10/09/2018   Influenza,inj,quad, With Preservative 10/02/2015   PFIZER(Purple Top)SARS-COV-2 Vaccination 11/26/2019, 12/17/2019, 09/03/2020   PNEUMOCOCCAL CONJUGATE-20 07/14/2022   Pneumococcal Conjugate-13 10/02/2015   Tdap 01/20/2024   Pertinent  Health Maintenance Due  Topic Date Due   HEMOGLOBIN A1C  01/17/2025   OPHTHALMOLOGY EXAM  02/05/2025   FOOT EXAM  03/19/2025   DEXA SCAN  Completed   Influenza Vaccine  Discontinued   Mammogram  Discontinued   Colonoscopy  Discontinued      03/17/2023    3:09 PM 07/20/2023    3:22 PM 01/20/2024    1:17 PM 03/19/2024    2:51 PM 07/24/2024    1:43 PM  Fall  Risk  Falls in the past year? 0 0 0 0 0  Was there an injury with Fall? 0 0 0 0 0  Fall Risk Category Calculator 0 0 0 0 0  Patient at Risk for Falls Due to No Fall Risks No Fall Risks No Fall Risks No Fall Risks No Fall Risks  Fall risk Follow up Falls evaluation completed Falls evaluation completed Falls evaluation completed Falls evaluation completed Falls evaluation completed   Functional Status Survey:    Vitals:   07/24/24 1344  BP: 102/70  Pulse: 82  Resp: 17  Temp: 97.9 F (36.6 C)  SpO2: 96%  Weight: 158 lb 9.6 oz (71.9 kg)  Height: 5' 3.5 (1.613 m)   Body mass index is 27.65 kg/m. Physical Exam GENERAL: Alert, cooperative, well developed, no acute distress. HEENT: Normocephalic, normal oropharynx, moist mucous membranes, ears normal bilaterally, nose  normal. CHEST: Clear to auscultation bilaterally, no wheezes, rhonchi, or crackles, no tenderness on back. CARDIOVASCULAR: Normal heart rate and rhythm, S1 and S2 normal without murmurs. ABDOMEN: Soft, non-tender, non-distended, without organomegaly, normal bowel sounds. EXTREMITIES: No cyanosis or edema, no swelling in knees, feet normal. NEUROLOGICAL: Cranial nerves grossly intact, moves all extremities without gross motor or sensory deficit, sensation intact in feet bilaterally. SKIN: Scratches on right leg from scratching. Left great toe callus and bilateral foot Bunion noted      Labs reviewed: Recent Labs    01/17/24 0908 07/20/24 1051  NA 137 138  K 4.6 4.3  CL 100 96*  CO2 28 30  GLUCOSE 94 95  BUN 24 19  CREATININE 1.10* 0.96  CALCIUM  10.0 10.6*   Recent Labs    01/17/24 0908 07/20/24 1051  AST 14 14  ALT 6 7  BILITOT 0.9 0.8  PROT 7.7 7.6   Recent Labs    01/17/24 0908 07/20/24 1051  WBC 7.8 8.0  NEUTROABS 4,563 4,648  HGB 13.3 13.4  HCT 42.1 42.0  MCV 80.8 80.6  PLT 242 240   Lab Results  Component Value Date   TSH 2.15 07/20/2024   Lab Results  Component Value Date   HGBA1C 6.1 (H) 07/20/2024   Lab Results  Component Value Date   CHOL 146 07/20/2024   HDL 49 (L) 07/20/2024   LDLCALC 83 07/20/2024   TRIG 64 07/20/2024   CHOLHDL 3.0 07/20/2024    Significant Diagnostic Results in last 30 days:  No results found.  Assessment/Plan  Type 2 diabetes mellitus with diabetic retinopathy Type 2 diabetes is well-controlled with an A1c of 6.1, improved from 6.3. Fasting glucose is 95, slightly increased from 94 six months ago.  - Continue current diabetes management plan. - Encouraged dietary modifications to maintain glucose levels below 100 mg/dL. - Encouraged regular exercise to improve glucose control.  Essential hypertension Blood pressure is well-controlled with current medication regimen. No reports of leg swelling. - Continue current  antihypertensive medications: amlodipine  10 mg, losartan  50 mg, and hydrochlorothiazide  12.5 mg.  Mixed hyperlipidemia Cholesterol levels are well-controlled with total cholesterol at 146, HDL at 49, triglycerides at 64, and LDL at 83. HDL is slightly below the target of 50. - Continue rosuvastatin  10 mg daily. - Encouraged regular exercise to increase HDL levels.  Chronic kidney disease, stage 3 Kidney function is stable with improved BUN levels, indicating good hydration and kidney clearance. Calcium  level is slightly elevated at 10.6, above the target of 10.4. - Continue to monitor calcium  levels. - Encouraged adequate hydration.  Generalized anxiety  disorder Anxiety is well-managed with sertraline  25 mg daily. - Continue sertraline  25 mg daily.  Mild cognitive impairment (on Namenda , not meeting dementia criteria) Cognitive function is stable with a mini mental state exam score of 29/30. No significant memory issues reported. Namenda  5 mg daily is effective. - Continue Namenda  5 mg daily. - Encouraged cognitive activities such as puzzles.  History of psychosis (resolved) Psychosis is resolved with no current symptoms of paranoia or confusion. - Documented resolution of psychosis.  Glaucoma Reports vision issues possibly related to glasses rather than glaucoma. - Continue current eye drop regimen. - Follow up with eye doctor in December for vision assessment and potential glasses adjustment.  Osteoarthritis Causes occasional pain, managed with Biofreeze as needed. - Continue Biofreeze as needed for pain relief.  Bunion and ingrown toenails, left foot Bunion and ingrown toenails present on the left foot. Toenails are growing into the skin, risking ulceration. - Referred to foot doctor for toenail trimming and bunion management.  Dry skin, right leg Dry skin on the right leg causing itching and scratching. - Apply lotion to affected area to alleviate dryness.           Family/ staff Communication: Reviewed plan of care with patient and Husband verbalized understanding   Labs/tests ordered:  - CBC with Differential/Platelet - CMP with eGFR([facility]) - TSH - Hgb A1C - Lipid panel   Next Appointment : Return in about 6 months (around 01/21/2025) for medical mangement of chronic issues., Fasting labs in 6 months prior to visit.   Spent 30 minutes of Face to face and non-face to face with patient  >50% time spent counseling; reviewing medical record; tests; labs; documentation and developing future plan of care.   Roxan JAYSON Plough, NP

## 2024-07-25 LAB — MICROALBUMIN / CREATININE URINE RATIO
Creatinine, Urine: 293 mg/dL — ABNORMAL HIGH (ref 20–275)
Microalb Creat Ratio: 14 mg/g{creat} (ref <30)
Microalb, Ur: 4.1 mg/dL

## 2024-07-26 ENCOUNTER — Ambulatory Visit: Payer: Self-pay | Admitting: Family

## 2024-08-04 ENCOUNTER — Other Ambulatory Visit: Payer: Self-pay | Admitting: Family

## 2024-08-25 ENCOUNTER — Other Ambulatory Visit: Payer: Self-pay | Admitting: Family

## 2025-01-18 ENCOUNTER — Other Ambulatory Visit

## 2025-01-22 ENCOUNTER — Ambulatory Visit: Admitting: Family
